# Patient Record
Sex: Female | Born: 1945 | Race: White | Hispanic: No | Smoking: Heavy tobacco smoker
Health system: Southern US, Community
[De-identification: ages and names within clinical notes are randomized; demographics above are authoritative.]

## PROBLEM LIST (undated history)

## (undated) DIAGNOSIS — F319 Bipolar disorder, unspecified: Secondary | ICD-10-CM

## (undated) DIAGNOSIS — F609 Personality disorder, unspecified: Secondary | ICD-10-CM

## (undated) DIAGNOSIS — Z9889 Other specified postprocedural states: Secondary | ICD-10-CM

## (undated) DIAGNOSIS — K219 Gastro-esophageal reflux disease without esophagitis: Secondary | ICD-10-CM

## (undated) DIAGNOSIS — F329 Major depressive disorder, single episode, unspecified: Secondary | ICD-10-CM

## (undated) DIAGNOSIS — M1990 Unspecified osteoarthritis, unspecified site: Secondary | ICD-10-CM

## (undated) DIAGNOSIS — J3489 Other specified disorders of nose and nasal sinuses: Secondary | ICD-10-CM

## (undated) DIAGNOSIS — K5909 Other constipation: Secondary | ICD-10-CM

## (undated) DIAGNOSIS — E538 Deficiency of other specified B group vitamins: Secondary | ICD-10-CM

## (undated) DIAGNOSIS — R569 Unspecified convulsions: Secondary | ICD-10-CM

## (undated) DIAGNOSIS — F1320 Sedative, hypnotic or anxiolytic dependence, uncomplicated: Secondary | ICD-10-CM

## (undated) DIAGNOSIS — M549 Dorsalgia, unspecified: Secondary | ICD-10-CM

## (undated) DIAGNOSIS — I10 Essential (primary) hypertension: Secondary | ICD-10-CM

## (undated) DIAGNOSIS — G8929 Other chronic pain: Secondary | ICD-10-CM

## (undated) DIAGNOSIS — F1011 Alcohol abuse, in remission: Secondary | ICD-10-CM

## (undated) DIAGNOSIS — J449 Chronic obstructive pulmonary disease, unspecified: Secondary | ICD-10-CM

## (undated) DIAGNOSIS — F32A Depression, unspecified: Secondary | ICD-10-CM

## (undated) DIAGNOSIS — F341 Dysthymic disorder: Secondary | ICD-10-CM

## (undated) DIAGNOSIS — Z720 Tobacco use: Secondary | ICD-10-CM

## (undated) DIAGNOSIS — F1010 Alcohol abuse, uncomplicated: Secondary | ICD-10-CM

## (undated) DIAGNOSIS — E785 Hyperlipidemia, unspecified: Secondary | ICD-10-CM

## (undated) HISTORY — DX: Other chronic pain: G89.29

## (undated) HISTORY — DX: Unspecified osteoarthritis, unspecified site: M19.90

## (undated) HISTORY — PX: APPENDECTOMY: SHX54

## (undated) HISTORY — DX: Alcohol abuse, in remission: F10.11

## (undated) HISTORY — DX: Chronic obstructive pulmonary disease, unspecified: J44.9

## (undated) HISTORY — DX: Other specified disorders of nose and nasal sinuses: J34.89

## (undated) HISTORY — DX: Other constipation: K59.09

## (undated) HISTORY — DX: Other specified postprocedural states: Z98.890

## (undated) HISTORY — DX: Dorsalgia, unspecified: M54.9

## (undated) HISTORY — PX: OTHER SURGICAL HISTORY: SHX169

---

## 2004-08-31 ENCOUNTER — Ambulatory Visit: Payer: Self-pay | Admitting: Cardiology

## 2004-08-31 ENCOUNTER — Inpatient Hospital Stay (HOSPITAL_COMMUNITY): Admission: EM | Admit: 2004-08-31 | Discharge: 2004-09-07 | Payer: Self-pay | Admitting: Emergency Medicine

## 2004-10-31 ENCOUNTER — Inpatient Hospital Stay (HOSPITAL_COMMUNITY): Admission: EM | Admit: 2004-10-31 | Discharge: 2004-11-04 | Payer: Self-pay | Admitting: Emergency Medicine

## 2004-10-31 ENCOUNTER — Ambulatory Visit: Payer: Self-pay | Admitting: Family Medicine

## 2004-11-07 ENCOUNTER — Ambulatory Visit (HOSPITAL_COMMUNITY): Admission: RE | Admit: 2004-11-07 | Discharge: 2004-11-07 | Payer: Self-pay | Admitting: Internal Medicine

## 2005-05-03 ENCOUNTER — Emergency Department (HOSPITAL_COMMUNITY): Admission: EM | Admit: 2005-05-03 | Discharge: 2005-05-03 | Payer: Self-pay | Admitting: Emergency Medicine

## 2005-07-13 ENCOUNTER — Ambulatory Visit: Payer: Self-pay | Admitting: Physical Medicine & Rehabilitation

## 2005-07-13 ENCOUNTER — Encounter
Admission: RE | Admit: 2005-07-13 | Discharge: 2005-10-11 | Payer: Self-pay | Admitting: Physical Medicine & Rehabilitation

## 2005-07-17 ENCOUNTER — Ambulatory Visit (HOSPITAL_COMMUNITY)
Admission: RE | Admit: 2005-07-17 | Discharge: 2005-07-17 | Payer: Self-pay | Admitting: Physical Medicine & Rehabilitation

## 2005-09-11 ENCOUNTER — Ambulatory Visit: Payer: Self-pay | Admitting: Physical Medicine & Rehabilitation

## 2005-10-10 ENCOUNTER — Encounter
Admission: RE | Admit: 2005-10-10 | Discharge: 2006-01-08 | Payer: Self-pay | Admitting: Physical Medicine & Rehabilitation

## 2005-11-06 ENCOUNTER — Ambulatory Visit: Payer: Self-pay | Admitting: Physical Medicine & Rehabilitation

## 2005-12-28 ENCOUNTER — Ambulatory Visit: Payer: Self-pay | Admitting: Gastroenterology

## 2005-12-28 ENCOUNTER — Encounter (INDEPENDENT_AMBULATORY_CARE_PROVIDER_SITE_OTHER): Payer: Self-pay | Admitting: Specialist

## 2005-12-28 ENCOUNTER — Ambulatory Visit: Payer: Self-pay | Admitting: Physical Medicine & Rehabilitation

## 2005-12-28 ENCOUNTER — Ambulatory Visit (HOSPITAL_COMMUNITY): Admission: RE | Admit: 2005-12-28 | Discharge: 2005-12-28 | Payer: Self-pay | Admitting: Gastroenterology

## 2005-12-28 DIAGNOSIS — Z9889 Other specified postprocedural states: Secondary | ICD-10-CM

## 2005-12-28 HISTORY — DX: Other specified postprocedural states: Z98.890

## 2006-01-03 ENCOUNTER — Ambulatory Visit (HOSPITAL_COMMUNITY)
Admission: RE | Admit: 2006-01-03 | Discharge: 2006-01-03 | Payer: Self-pay | Admitting: Physical Medicine & Rehabilitation

## 2006-01-23 ENCOUNTER — Encounter
Admission: RE | Admit: 2006-01-23 | Discharge: 2006-04-23 | Payer: Self-pay | Admitting: Physical Medicine & Rehabilitation

## 2006-02-21 ENCOUNTER — Ambulatory Visit: Payer: Self-pay | Admitting: Physical Medicine & Rehabilitation

## 2006-03-15 ENCOUNTER — Encounter
Admission: RE | Admit: 2006-03-15 | Discharge: 2006-06-13 | Payer: Self-pay | Admitting: Physical Medicine & Rehabilitation

## 2006-03-20 ENCOUNTER — Encounter (HOSPITAL_COMMUNITY)
Admission: RE | Admit: 2006-03-20 | Discharge: 2006-04-19 | Payer: Self-pay | Admitting: Physical Medicine & Rehabilitation

## 2006-03-26 ENCOUNTER — Ambulatory Visit: Payer: Self-pay | Admitting: Physical Medicine & Rehabilitation

## 2006-05-10 ENCOUNTER — Ambulatory Visit: Payer: Self-pay | Admitting: Physical Medicine & Rehabilitation

## 2006-06-13 ENCOUNTER — Encounter
Admission: RE | Admit: 2006-06-13 | Discharge: 2006-09-11 | Payer: Self-pay | Admitting: Physical Medicine & Rehabilitation

## 2006-06-13 ENCOUNTER — Ambulatory Visit: Payer: Self-pay | Admitting: Physical Medicine & Rehabilitation

## 2006-06-26 ENCOUNTER — Ambulatory Visit (HOSPITAL_COMMUNITY)
Admission: RE | Admit: 2006-06-26 | Discharge: 2006-06-26 | Payer: Self-pay | Admitting: Physical Medicine & Rehabilitation

## 2006-07-18 ENCOUNTER — Emergency Department (HOSPITAL_COMMUNITY): Admission: EM | Admit: 2006-07-18 | Discharge: 2006-07-18 | Payer: Self-pay | Admitting: Emergency Medicine

## 2006-07-19 ENCOUNTER — Ambulatory Visit: Payer: Self-pay | Admitting: Physical Medicine & Rehabilitation

## 2006-09-19 ENCOUNTER — Encounter
Admission: RE | Admit: 2006-09-19 | Discharge: 2006-12-18 | Payer: Self-pay | Admitting: Physical Medicine & Rehabilitation

## 2006-09-19 ENCOUNTER — Ambulatory Visit: Payer: Self-pay | Admitting: Physical Medicine & Rehabilitation

## 2006-09-24 ENCOUNTER — Ambulatory Visit (HOSPITAL_COMMUNITY)
Admission: RE | Admit: 2006-09-24 | Discharge: 2006-09-24 | Payer: Self-pay | Admitting: Physical Medicine & Rehabilitation

## 2006-10-04 ENCOUNTER — Encounter
Admission: RE | Admit: 2006-10-04 | Discharge: 2007-01-02 | Payer: Self-pay | Admitting: Physical Medicine & Rehabilitation

## 2006-10-29 ENCOUNTER — Ambulatory Visit: Payer: Self-pay | Admitting: Physical Medicine & Rehabilitation

## 2006-11-13 ENCOUNTER — Ambulatory Visit: Payer: Self-pay | Admitting: Physical Medicine & Rehabilitation

## 2006-12-17 ENCOUNTER — Ambulatory Visit: Payer: Self-pay | Admitting: Physical Medicine & Rehabilitation

## 2007-01-14 ENCOUNTER — Encounter
Admission: RE | Admit: 2007-01-14 | Discharge: 2007-02-26 | Payer: Self-pay | Admitting: Physical Medicine & Rehabilitation

## 2007-02-15 ENCOUNTER — Encounter
Admission: RE | Admit: 2007-02-15 | Discharge: 2007-05-16 | Payer: Self-pay | Admitting: Physical Medicine & Rehabilitation

## 2007-02-15 ENCOUNTER — Ambulatory Visit: Payer: Self-pay | Admitting: Physical Medicine & Rehabilitation

## 2007-03-25 ENCOUNTER — Ambulatory Visit: Payer: Self-pay | Admitting: Orthopedic Surgery

## 2007-03-25 DIAGNOSIS — M549 Dorsalgia, unspecified: Secondary | ICD-10-CM | POA: Insufficient documentation

## 2007-03-30 ENCOUNTER — Emergency Department (HOSPITAL_COMMUNITY): Admission: EM | Admit: 2007-03-30 | Discharge: 2007-03-30 | Payer: Self-pay | Admitting: Emergency Medicine

## 2007-04-16 ENCOUNTER — Ambulatory Visit: Payer: Self-pay | Admitting: Physical Medicine & Rehabilitation

## 2007-05-10 ENCOUNTER — Encounter
Admission: RE | Admit: 2007-05-10 | Discharge: 2007-06-12 | Payer: Self-pay | Admitting: Physical Medicine & Rehabilitation

## 2007-06-07 ENCOUNTER — Ambulatory Visit: Payer: Self-pay | Admitting: Physical Medicine & Rehabilitation

## 2007-06-18 ENCOUNTER — Emergency Department (HOSPITAL_COMMUNITY): Admission: EM | Admit: 2007-06-18 | Discharge: 2007-06-18 | Payer: Self-pay | Admitting: Emergency Medicine

## 2007-07-11 ENCOUNTER — Encounter
Admission: RE | Admit: 2007-07-11 | Discharge: 2007-10-09 | Payer: Self-pay | Admitting: Physical Medicine & Rehabilitation

## 2007-07-11 ENCOUNTER — Ambulatory Visit: Payer: Self-pay | Admitting: Physical Medicine & Rehabilitation

## 2007-07-31 ENCOUNTER — Ambulatory Visit: Payer: Self-pay | Admitting: Physical Medicine & Rehabilitation

## 2007-09-05 ENCOUNTER — Ambulatory Visit: Payer: Self-pay | Admitting: Physical Medicine & Rehabilitation

## 2007-10-02 ENCOUNTER — Ambulatory Visit: Payer: Self-pay | Admitting: Physical Medicine & Rehabilitation

## 2007-11-01 ENCOUNTER — Encounter
Admission: RE | Admit: 2007-11-01 | Discharge: 2008-01-30 | Payer: Self-pay | Admitting: Physical Medicine & Rehabilitation

## 2007-11-04 ENCOUNTER — Ambulatory Visit: Payer: Self-pay | Admitting: Physical Medicine & Rehabilitation

## 2007-12-04 ENCOUNTER — Ambulatory Visit: Payer: Self-pay | Admitting: Physical Medicine & Rehabilitation

## 2008-01-03 ENCOUNTER — Ambulatory Visit: Payer: Self-pay | Admitting: Physical Medicine & Rehabilitation

## 2008-01-30 ENCOUNTER — Encounter
Admission: RE | Admit: 2008-01-30 | Discharge: 2008-04-29 | Payer: Self-pay | Admitting: Physical Medicine & Rehabilitation

## 2008-01-31 ENCOUNTER — Ambulatory Visit: Payer: Self-pay | Admitting: Physical Medicine & Rehabilitation

## 2008-02-28 ENCOUNTER — Ambulatory Visit: Payer: Self-pay | Admitting: Physical Medicine & Rehabilitation

## 2008-03-27 ENCOUNTER — Ambulatory Visit: Payer: Self-pay | Admitting: Physical Medicine & Rehabilitation

## 2008-04-27 ENCOUNTER — Encounter
Admission: RE | Admit: 2008-04-27 | Discharge: 2008-07-26 | Payer: Self-pay | Admitting: Physical Medicine & Rehabilitation

## 2008-04-27 ENCOUNTER — Ambulatory Visit: Payer: Self-pay | Admitting: Physical Medicine & Rehabilitation

## 2008-05-25 ENCOUNTER — Ambulatory Visit: Payer: Self-pay | Admitting: Physical Medicine & Rehabilitation

## 2008-06-19 ENCOUNTER — Ambulatory Visit: Payer: Self-pay | Admitting: Physical Medicine & Rehabilitation

## 2008-07-17 ENCOUNTER — Ambulatory Visit: Payer: Self-pay | Admitting: Physical Medicine & Rehabilitation

## 2008-08-10 ENCOUNTER — Encounter
Admission: RE | Admit: 2008-08-10 | Discharge: 2008-11-08 | Payer: Self-pay | Admitting: Physical Medicine & Rehabilitation

## 2008-08-11 ENCOUNTER — Ambulatory Visit: Payer: Self-pay | Admitting: Physical Medicine & Rehabilitation

## 2008-09-11 ENCOUNTER — Ambulatory Visit: Payer: Self-pay | Admitting: Physical Medicine & Rehabilitation

## 2008-10-09 ENCOUNTER — Ambulatory Visit: Payer: Self-pay | Admitting: Physical Medicine & Rehabilitation

## 2008-11-03 ENCOUNTER — Ambulatory Visit: Payer: Self-pay | Admitting: Physical Medicine & Rehabilitation

## 2008-12-03 ENCOUNTER — Encounter
Admission: RE | Admit: 2008-12-03 | Discharge: 2009-03-03 | Payer: Self-pay | Admitting: Physical Medicine & Rehabilitation

## 2008-12-04 ENCOUNTER — Ambulatory Visit: Payer: Self-pay | Admitting: Physical Medicine & Rehabilitation

## 2009-01-01 ENCOUNTER — Ambulatory Visit: Payer: Self-pay | Admitting: Physical Medicine & Rehabilitation

## 2009-01-29 ENCOUNTER — Ambulatory Visit: Payer: Self-pay | Admitting: Physical Medicine & Rehabilitation

## 2009-03-28 ENCOUNTER — Emergency Department (HOSPITAL_COMMUNITY): Admission: EM | Admit: 2009-03-28 | Discharge: 2009-03-28 | Payer: Self-pay | Admitting: Emergency Medicine

## 2009-04-20 ENCOUNTER — Encounter: Payer: Self-pay | Admitting: Cardiology

## 2009-06-15 DIAGNOSIS — I10 Essential (primary) hypertension: Secondary | ICD-10-CM | POA: Insufficient documentation

## 2009-06-15 DIAGNOSIS — R55 Syncope and collapse: Secondary | ICD-10-CM

## 2009-06-15 DIAGNOSIS — E538 Deficiency of other specified B group vitamins: Secondary | ICD-10-CM | POA: Insufficient documentation

## 2009-06-15 DIAGNOSIS — J449 Chronic obstructive pulmonary disease, unspecified: Secondary | ICD-10-CM

## 2009-06-15 DIAGNOSIS — R5381 Other malaise: Secondary | ICD-10-CM | POA: Insufficient documentation

## 2009-06-15 DIAGNOSIS — F17200 Nicotine dependence, unspecified, uncomplicated: Secondary | ICD-10-CM | POA: Insufficient documentation

## 2009-06-15 DIAGNOSIS — R5383 Other fatigue: Secondary | ICD-10-CM

## 2009-06-15 HISTORY — DX: Deficiency of other specified B group vitamins: E53.8

## 2009-06-16 ENCOUNTER — Ambulatory Visit: Payer: Self-pay | Admitting: Cardiology

## 2009-06-16 ENCOUNTER — Encounter (INDEPENDENT_AMBULATORY_CARE_PROVIDER_SITE_OTHER): Payer: Self-pay | Admitting: *Deleted

## 2009-06-16 DIAGNOSIS — R9431 Abnormal electrocardiogram [ECG] [EKG]: Secondary | ICD-10-CM

## 2009-06-28 ENCOUNTER — Ambulatory Visit: Payer: Self-pay | Admitting: Cardiology

## 2009-06-28 ENCOUNTER — Encounter: Payer: Self-pay | Admitting: Cardiology

## 2009-11-22 ENCOUNTER — Telehealth (INDEPENDENT_AMBULATORY_CARE_PROVIDER_SITE_OTHER): Payer: Self-pay | Admitting: *Deleted

## 2010-06-19 ENCOUNTER — Encounter: Payer: Self-pay | Admitting: Physical Medicine & Rehabilitation

## 2010-06-28 NOTE — Progress Notes (Signed)
Summary: Pt question  Phone Note Call from Patient Call back at Home Phone [phone]   Summary of Call: Pt left 2 messages on voicemail asking for a return call. Both messages were confusing with pt stating something about wanting to know what Eugenie Birks is and that the doctor put her Lexiscan. Pt stated in second message that she keeps calling the heart doctor's office but getting the hospital. She stated in the message that she didn't call the hospital and doesn't know how she got them. I am not sure why pt felt she was leaving a message at the hospital when both messages were left in out office.  Contacted pt via phone. She states she doesn't know what Eugenie Birks is but she thinks she is suppose to be on it. Pt notified that Eugenie Birks is the medication that was used to stress her heart for the stress test. Pt is not suppose to be taking this medication. Pt notified her test was normal and she does not need further cardiac work up. Pt verbalized understanding.  Initial call taken by: Cyril Loosen, RN, BSN,  November 22, 2009 4:24 PM
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
## 2010-06-28 NOTE — Letter (Signed)
Summary: Internal Other/ PATIENT HISTORY FORM  Internal Other/ PATIENT HISTORY FORM   Imported By: Dorise Hiss 06/16/2009 16:19:26  _____________________________________________________________________  External Attachment:    Type:   Image     Comment:   External Document

## 2010-06-28 NOTE — Letter (Signed)
Summary: Lexiscan or Dobutamine Pharmacist, community at [facility]  [address]   [place], [place] [address]   Phone: [phone]  Fax: [phone]      [facility]  Lexiscan or Dobutamine Cardiolite Strss Test    [facility] [place]  Appointment Date:_  Appointment Time:_  Your doctor has ordered a CARDIOLITE STRESS TEST using a medication to stimulate exercise so that you will not have to walk on the treadmill to determine the condition of your heart during stress. If you take blood pressure medication, ask your doctor if you should take it the day of your test.  You may take your medications the day of your test.You should not have anything to eat or drink at least 4 hours before your test is scheduled, and no caffeine, including decaffeinated tea and coffee, chocolate, and soft drinks for 24 hours before your test.  You will need to register at the Outpatient/Main Entrance at the hospital 15 minutes before your appointment time. It is a good idea to bring a copy of your order with you. They will direct you to the Diagnostic Imaging (Radiology) Department.  You will be asked to undress from the waist up and given a hospital gown to wear, so dress comfortably from the waist down for example: Sweat pants, shorts, or skirt Rubber soled lace up shoes (tennis shoes)  Plan on about three hours from registration to release from the hospital

## 2010-06-28 NOTE — Letter (Signed)
Summary: [facility]  [facility]   Imported By: Zachary George 06/15/2009 16:14:28  _____________________________________________________________________  External Attachment:    Type:   Image     Comment:   External Document

## 2010-10-11 NOTE — Assessment & Plan Note (Signed)
FOLLOWUP OFFICE NOTE   Ashley Hall is back regarding chronic back pain.  She states that her back  has worsened since the initial medial branch block.  She did have good  relief the day of the procedure lasting for a few hours.  She is  reported to have 0/10 pain leaving the office on June 2.  Since the time  of the procedure, she has had back and left hip pain.  She has had some  persistent leg pain as well distally, although MRI is not concordant  with a radicular problem.  Sleep is poor.  She has pain with walking and  bending.  She rates her pain an 8/10 to 10/10.  Pain interferes with  general activity, relations with others, and enjoyment of life on a  severe level.   REVIEW OF SYSTEMS:  The patient reports trouble walking, dizziness,  confusion, depression, anxiety, constipation, coughing, abdominal pain,  wheezing, or shortness of breath.  Full review is in the written health  and history section.   SOCIAL HISTORY:  Without significant change today.   PHYSICAL EXAM:  Blood pressure is 108/63, pulse is 99, respiratory rate  is 20.  She is satting 98% on room air.  The patient is pleasant, alert and oriented x3.  She has been anxious at  times.  She ambulates for me and was antalgic to the left side today.  Left  greater trochanter was painful with palpation.  She was also tender on  lower lumbar paraspinals and facets.  Facet maneuvers were positive.  She is limited in flexion as well today.  She had Lidoderm patches over  the low back.  I saw no focal motor or sensory signs in the lower  extremities today.  Reflexes are generally 1+ to 2+ bilaterally.  Neck  and upper extremity exam was stable.  HEART:  Regular.  CHEST:  Clear.  ABDOMEN:  Soft and nontender.   ASSESSMENT:  1. Chronic low back pain, generally lumbar facet in nature.  The      patient had a positive response with the medial branch blockers.      Response lasted a few hours.  2. Chronic daily headaches.  3.  Cervical facet disease and post-laminectomy syndrome.  4. Bipolar disorder.  5. Myofascial pain.  6. History of left trochanteric bursitis.  7. Osteoarthritis of the knees and potentially the hips.   PLAN:  1. Continue fentanyl and Vicodin as ordered.  2. After informed consent we injected the left greater trochanter with      40 mg of Kenalog and 3 cc of 1% lidocaine.  3. Gave the patient a prednisone burst to decrease inflammation and      pain here acutely.  4. We will send her back to Dr. Wynn Banker for radiofrequency ablation      at L4-5 and L5-S1 bilaterally.  5. I will see the patient back pending followup with Dr. Wynn Banker.     Ranelle Oyster, M.D.  Electronically Signed    ZTS/MedQ  D:  11/13/2006 12:50:40  T:  11/13/2006 15:54:31  Job #:  [id number]

## 2010-10-11 NOTE — Procedures (Signed)
NAME:  Ashley Hall, Ashley Hall              ACCOUNT NO.:  [id number]   MEDICAL RECORD NO.:  [id number]          PATIENT TYPE:  REC   LOCATION:  [facility]                          FACILITY:  [facility]   PHYSICIAN:  Erick Colace, M.D.DATE OF BIRTH:  01/11/1946   DATE OF PROCEDURE:  12/17/2006  DATE OF DISCHARGE:                               OPERATIVE REPORT   This is a left L5 dorsal ramus injection and radiofrequency neurotomy,  left L4 medial branch block and radiofrequency neurotomy, left L3 medial  branch block and radiofrequency neurotomy.   INDICATION:  Chronic low back pain, lumbar facet, relieved by medial  branch blocks x2.   Pain is only partially responsive to narcotic analgesic management.   Informed consent was obtained after the describing risks and benefits of  the procedure to the patient today.  These include bleeding, bruising,  infection, loss of bowel or bladder function, temporary or permanent  paralysis.  She elects proceed and has given written consent.   The patient placed prone on fluoroscopy table.  Betadine prep, sterile  drape.  A 25-gauge inch and a half needle was used to anesthetize the  skin and subcu tissue, 1% lidocaine x2 mL, then a 20 gauge 10-cm RF  needle with 10-mm curved active tip was inserted targeting the left S1  SAP-sacral ala junction, bone contact made, confirmed with lateral  imaging.  Motor Stim x3 volts demonstrated no lower extremity twitch,  followed by injection of 1 mL of a solution containing 1 mL of 4 mg/mL  dexamethasone and 2 mL of 1% MPF lidocaine, followed by RF lesioning at  70 degrees x70 seconds.  Then the left L5 SAP-transverse process  junction targeted, bone contact made, confirmed with lateral imaging.  Motor Stim x3 volts demonstrated no lower extremity twitch, followed by  RF lesioning at 70 degrees x 70 seconds, after the dexamethasone-  lidocaine solution was injected, and last the left L4 SAP-transverse  process junction  targeted, bone contact made, confirmed with lateral  imaging.  Motor Stim x3 volts demonstrated no lower extremity twitch  followed by injection 1 mL of the dexamethasone-lidocaine solution and  RF lesioning at 70 degrees x70 seconds.  The patient tolerated the  procedure well.  Post injection instructions given.  Given the patient  moved quite a bit, particularly on the last RF, will Valium 5 mg p.o.  one to two prior to the procedure in 1 month on the right side when we  do the right side in 1 month.      Erick Colace, M.D.  Electronically Signed     AEK/MEDQ  D:  12/17/2006 10:36:20  T:  12/17/2006 11:31:07  Job:  [id number]

## 2010-10-11 NOTE — Assessment & Plan Note (Signed)
FOLLOWUP OFFICE NOTE   HISTORY OF PRESENT ILLNESS:  The patient is back regarding her chronic  pain.  She rates the pain as 6/10.  The glucosamine has helped her knees  somewhat.  We had an MRI performed of her back as this seemed to  progressing from a symptomatic standpoint.  The MRI revealed significant  facet arthropathy but otherwise no substantial change from her last one  in August 2007.   She is set up to see Dr. Wynn Banker at the beginning of next month for  injection.  The patient is using her Fentanyl patch and Vicodin.  She  complains of headaches.  She had told me previously that Lidoderm and  Topamax seemed to be helping her back and headaches.  The patient also  is on Celebrex and Cymbalta.   REVIEW OF SYSTEMS:  The patient reports trouble walking, occasional  depression, tingling.  Other pertinent positives listed above.  Full  review is in the written office history section.   SOCIAL HISTORY:  Without significant change today.   PHYSICAL EXAMINATION:  Blood pressure is 96/56, pulse is 85, respiratory  rate 16.  She is sating 99% on room air.  The patient is pleasant, in no  acute distress.  She is alert and oriented x3.  Affect is generally  bright and appropriate.  Heart is regular.  Chest is clear.  Abdomen is  soft, nontender.  On examination of the back, she had some ongoing  tenderness in the lumbar paraspinals -- left greater than right.  She  had pain over the facets and worse pain with facet maneuvers today.  Flexion caused some discomfort as well.  Seated slump test and straight  leg testing were equivocal to positive.  Strength remains reserved at 5-  /5.  Reflexes 2+.  Sensory exam is grossly intact.  The patient had some  pain with range of motion of the neck and simple flexion and extension  and bending to the sides.  Bilateral knees reveal mild crepitus and some  pain with meniscal maneuvers.  Mild anterior instability was noted in  both knees as  well.   ASSESSMENT:  1. Chronic daily headaches related to cervical facet disease and post      laminectomy syndrome.  2. Bipolar disorder.  3. Lumbar spondylosis with facet arthropathy.  4. Myofascial pain.  5. History of left greater trochanter bursitis.  6. History of osteoarthritis of the knees and hips.   PLAN:  1. After informed consent, we injected both knees using 40 mg of      Kenalog and 3 cc of 1% lidocaine.  The patient tolerated it well.  2. Maintain current Fentanyl and Vicodin doses.  3. Topamax and Lidoderm were continued as well as Celebrex and      Cymbalta today.  4. The patient will see Dr. Wynn Banker in June for medial branch blocks      bilaterally at L4-5 and L5-S1.  5. I will see her back after injections.      Ranelle Oyster, M.D.  Electronically Signed    ZTS/MedQ  D:  10/23/2006 12:41:05  T:  10/23/2006 13:05:22  Job #:  [id number]

## 2010-10-11 NOTE — Assessment & Plan Note (Signed)
Ashley Hall is back regarding her chronic pain.  She has been doing better  in general with her headaches and neck pain with recurrent treatment  plan.  Over the last few months, her low back pain seems to be more of a  factor, and she is very limited with her walking.  She has pain in her  back which develops when she walks to the mailbox or to the car.  It  improves somewhat when she sits.  It also bothers her when she stands  for periods of time.  She rates the pain a 5 to 8/10.  I do not have any  up to date imaging of her back to view.  Pain sometimes in the back  radiates into her leg, but the back pain itself seems to be her primary  complaint.  The patient describes the pain as sharp, stabbing and  aching.  The pain interferes with general activity, relations with  others, enjoyment of life on a moderate level.   REVIEW OF SYSTEMS:  The patient reports occasional bladder control  problems, confusion and depression, which are baseline.  A full review  is in the written health and history section.   SOCIAL HISTORY:  The patient is without change.  She has her personal  care assistant with her today.   PHYSICAL EXAMINATION:  VITAL SIGNS:  Blood pressure is 105/44, pulse is  87, respiratory rate 16.  She is satting 96% on room air.  GENERAL:  The patient is pleasant, alert and oriented x3.  Affect is  generally bright, a bit anxious at times and tearful when she discussed  her pain.  HEART:  Regular.  CHEST:  Clear.  ABDOMEN:  Soft, nontender.  Patient had tenderness in the left lumbar  paraspinals and left flank.  She had some pain with palpation over the  facets and spinous processes in the left side.  She had Lidoderm patches  over her back.  She had pain with left side bending and left facet  maneuvers.  Extension caused some discomfort.  She had minimal to no  pain with right side bending and right-sided facet maneuvers today.  Flexion caused minimal discomfort.  Slump test was  equivocal to  positive.  Strength was sporadic but generally 4+-5/5.  Reflexes are 2+.  Sensory exam was grossly intact.  NECK:  Stable.  Cognitively, she was generally appropriate at her  baseline.   ASSESSMENT:  1. Chronic daily headaches related to cervical facet disease and post-      laminectomy syndrome.  2. Bipolar disorder.  3. Myofascial pain.  4. Lumbar spondylosis with facet arthropathy.  5. Left per-trochanteric bursitis, questionable osteoarthritis of the      hips and knees.   PLAN:  1. Continue Fentanyl and Vicodin as ordered.  2. Maintain Lidoderm and Topamax at current doses, as well as Celebrex      and Cymbalta.  3. Gave patient a trial of Flector patches for her knees.  She may      also try over the counter Glucosamine.  4. We will send the patient for MRI of lumbar spine to determine      further coarse of treatment. Will see her back pending study.      Ranelle Oyster, M.D.  Electronically Signed     ZTS/MedQ  D:  09/21/2006 13:27:07  T:  09/21/2006 13:47:01  Job #:  [id number]

## 2010-10-11 NOTE — Assessment & Plan Note (Signed)
Ashley Hall is back regarding her low back pain.  She states that she is  having more problems than ever.  Her Oswestry score is 62%.  She rates  her pain at 5/10.  She feels that she can walk on her own now.  Pain is  sharp, burning, and aching.  Pain interferes with general activity,  relations with others and enjoyment of life on a severe level.  She  states that I am not giving her enough pain medication.   The patient states pain is worse with walking and standing, sometimes  prolonged sitting.  Pain improves with her medications as well as heat.   REVIEW OF SYSTEMS:  Notable for the above as well as depression,  anxiety, weight gain, limb swelling, has a wheezing and coughing.  Full  review is in the written health and history section of the chart.   SOCIAL HISTORY:  Notable for her sister being ill with cancer,  apparently.  She also reports that her Psychiatrist is no longer in  business.   PHYSICAL EXAMINATION:  VITAL SIGNS:  Blood pressure is 120/70 and pulse  is 84.  GENERAL:  The patient is generally pleasant, alert, and oriented x3.  MUSCULOSKELETAL:  She has variable motor function exam with anywhere  from 3-5/5 strength that noted in all the muscles, changing from moment  to moment, frankly.  Sensory exam was grossly intact.  HEART:  Regular.  CHEST:  Clear.  ABDOMEN:  Soft and nontender.  NEUROLOGIC:  Cognitively, she is unchanged.  She continues to fixate on  medications.   ASSESSMENT:  1. Lumbar spondylosis with facet syndrome.  2. Trochanteric bursitis of the hips.  3. Post laminectomy syndrome of the cervical spine.  4. Bipolar disorder.  5. Myofascial pain.   PLAN:  1. We will try Tylenol, #3 for breakthrough pain 1 q.8 h. p.r.n.  She      can stay with her fentanyl patch 100 mcg q.72 h.  2. Maintain Celexa, Wellbutrin, Lidoderm, Topamax, and Celebrex at      current doses.  3. She needs psychiatric followup.  I will not fill Xanax or Ativan      for her.  4. We will see her back in 3 months for followup and 1 month in      nursing clinic.      Ranelle Oyster, M.D.  Electronically Signed     ZTS/MedQ  D:  07/17/2008 12:53:23  T:  07/18/2008 02:40:43  Job #:  [id number]

## 2010-10-11 NOTE — Assessment & Plan Note (Signed)
Ashley Hall is back regarding her multiple pain complaints.  She is seeing a  new psychiatrist who has her on Remeron and Valium and she seems to be  doing fairly well with that.  She states her pain is 6/10 today and  states it is stabbing, also bothering her head, back, and legs.  She is  walking and getting around more.  She uses a rollator for balance and  support.  She remains on the fentanyl patch 100 mcg, Tylenol No. 3 one  q.8 h. p.r.n. breakthrough pain.   REVIEW OF SYSTEMS:  Notable for trouble walking, bowel, bladder issues  as well as depression, anxiety, constipation, skin rash.  Other  pertinent positives are above and full review is in the written health  and history section of the chart.   SOCIAL HISTORY:  The patient lives alone, divorced, still smoked 1 pack  of cigarettes per day.   PHYSICAL EXAMINATION:  Blood pressure is 116/72, pulse is 86,  respiratory rate 18.  She is sating 99% on room air.  The patient is  pleasant, alert, and oriented x3.  She walked with her walker and is  able to walk in the room without it today.  She seemed to be much more  balanced, much more alert and grounded today.  Posture was fair to good.  Strength is fluctuating in 4- to 4/5 throughout.  Sensory exam is  essentially intact.  Heart is regular.  Chest is clear.  Abdomen is  soft, nontender.  Cognitively, I thought she is a bit more alert and  focused, however, she continues to fix in her usual topics.   ASSESSMENT:  1. Lumbar spondylosis with facet syndrome.  2. Bilateral trochanteric bursitis.  3. Cervical post laminectomy syndrome.  4. Bipolar disorder.  5. Myofascial pain.   PLAN:  1. We will maintain fentanyl patch at 100 mcg q.72 h.  I did not want      to go higher on this at this point and explained this to the      patient today.  2. We refilled Tylenol No. 3 1-2 q.8 h. p.r.n. #100.  She may use this      for breakthrough pain and occasionally may use 2 at a time when    needed.  3. Encouraged stretching exercise, appropriate diet, sleep, etc.  She      is to increase her physical stamina as well.  4. Continue Valium and Remeron per psychiatrist.  5. No other refills were indicated on meds including Topamax,      Celebrex, Lidoderm.  6. See her back in 1 month with nursing, 3 months with me.      Ashley Hall, M.D.  Electronically Signed     ZTS/MedQ  D:  11/03/2008 12:44:17  T:  11/04/2008 02:36:18  Job #:  [id number]

## 2010-10-11 NOTE — Assessment & Plan Note (Signed)
Ashley Hall is back regarding her low back pain.  Pain has been generally  stable at 5-7/10.  She continues on her fentanyl patch with p.r.n. Norco  7.5.  She states that the left hip has been bothering her more affecting  her walking.  She has a hard time walking to the mailbox and similar  distances.  Pain is described as constant and aching.  Pain interferes  with general activity, in relationship with others, and enjoyment of  life on a moderate level.  He is on a Lidoderm patch as well as Topamax  and Celebrex still as well, which gives some relief.   REVIEW OF SYSTEMS:  Notable for trouble walking, anxiety, depression,  confusion, constipation at times.  Other pertinent positives are above  and full review is in the written health and history section.   SOCIAL HISTORY:  The patient is divorced, living alone, although her  sister is highly involved with the medications and oversight.   PHYSICAL EXAMINATION:  VITAL SINGS:  Blood pressure is 106/60, pulse is  107, respiratory rate is 18.  She is sating 96% on room air.  GENERAL:  The patient is pleasant and generally appropriate.  She  continues to fixate on medication regimen and how much Vicodin I will  give her.  She does show some insight when we discussed the reasoning  and rationale for her treatment course.  EXTREMITIES:  Strength is 5/5.  She has some pain with flexion more than  extension, although movement is fair.  She had some pain over left  greater trochanter with palpation and definitely was antalgic on the  left side with weightbearing.  HEART:  Regular.  CHEST:  Clear.  ABDOMEN:  Soft and nontender.   ASSESSMENT:  1. Lumbar spondylosis with facet syndrome.  2. Trochanteric bursitis of the hips.  3. Cervical spondylosis and post-laminectomy syndrome.  4. Bipolar disorder.  5. Myofascial pain.   PLAN:  1. Continue Norco 7.5/325 one q.8 h., #90.  2. Increase fentanyl patch to 100 mcg q.72 h., #10.  3. Continue  Celexa 40 mg daily.  4. Continue Wellbutrin.  5. Continue Lidoderm, Topamax, and Celebrex.  6. After informed consent, we injected the left greater trochanter      with 40 mg of Kenalog and 3 L of 1% lidocaine.  The patient      tolerated it well.  The area was prepped and cleaned after      injection with no ill effects.      Ashley Hall, M.D.  Electronically Signed     ZTS/MedQ  D:  04/27/2008 12:54:51  T:  04/28/2008 02:37:36  Job #:  [id number]
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
## 2010-10-11 NOTE — Assessment & Plan Note (Signed)
Ashley Hall is back with her back and neck pain.  She states that her back  is bothering her, and her legs are giving out.  She had a good response  initially with medial branch blocks; however, the RFs did not help as  much, as she is having persistent pain.  She is on a Fentanyl patch 50  mcg every 72 hours and on Vicodin 5/500 1 every 8 hours p.r.n.  She  rates her pain a 5-7/10, describes it as constant and aching.  Pain  interferes with general activity, relations with others, and enjoyment  of life on a moderate to severe level.  She uses Lidoderm patches as  well for back pain control.   REVIEW OF SYSTEMS:  Otherwise negative.  Full review is in written  health and history section.   SOCIAL HISTORY:  Without change.   PHYSICAL EXAMINATION:  Blood pressure is 108/63.  Pulse is 85.  Respiratory rate 18.  She is sating 100% on room air.  The patient is  pleasant, alert and oriented x3.  Affect is bright and appropriate.  She  has some swelling along the left wrist which apparently she strained in  a fall.  Left knee is a bit wobbly with gait.  She has some bruises on  the limbs.  Back is tender with extension more than flexion today.  Facet maneuvers are positive.  Strength is generally 4-5/5 in both lower  extremities with 1+ to 2+ reflexes.  Cognition was generally stable.  She was a bit anxious but near her baseline.   ASSESSMENT:  1. Chronic headaches, likely related to cervical facet syndrome and      postlaminectomy disorder.  Questionable migraine component.  2. Bipolar disorder.  3. Myofascial pain.  4. Lumbar spondylosis and facet arthropathy.  5. Peritrochanteric bursitis of hips.  6. Osteoarthritis of the knees.   PLAN:  1. Increase Fentanyl patch to 75 mcg every 72 hours.  2. Change Vicodin to oxycodone 5 mg 1 every 6 hours p.r.n. #100.  3. Continue Lidoderm and Topamax as well as Celebrex and Cymbalta.  4. Encourage ongoing activity and exercises for the back and  legs.  5. I wrote her a prescription for a left wrist splint to use during      the day for wrist support.  I think she might ultimately benefit      from a walking cane as well.  6. I will see her back in about 2 months' time, the nurse clinic in 1      month.      Ranelle Oyster, M.D.  Electronically Signed     ZTS/MedQ  D:  02/19/2007 11:50:18  T:  02/19/2007 14:04:23  Job #:  [id number]

## 2010-10-11 NOTE — Procedures (Signed)
NAME:  Ashley Hall, Ashley Hall              ACCOUNT NO.:  [id number]   MEDICAL RECORD NO.:  [id number]          PATIENT TYPE:  REC   LOCATION:  [facility]                          FACILITY:  [facility]   PHYSICIAN:  Erick Colace, M.D.DATE OF BIRTH:  07-17-1945   DATE OF PROCEDURE:  10/29/2006  DATE OF DISCHARGE:                               OPERATIVE REPORT   PROCEDURE:  Bilateral L5 dorsal ramus injection, bilateral L3 and L4  medial branch blocks under fluoroscopic guidance.   INDICATIONS:  Lumbar axial pain.  She has had one set of medial branch  blocks done February 22, 2006 which gave complete relief of pain from 6-  0, however, a second set of injections March 19, 2006 had no pain  relief.  She did have an interval repeat MRI September 24, 2006 showing  facet arthropathy, L4-5, L5-S1, and no evidence of spinal stenosis and  no change in L4-5 disk bulge.   Her pain is only partially responsive to medication management including  narcotic analgesics.   Informed consent was obtained after describing the risks and benefits of  the procedure to the patient.  These include bleeding, bruising,  infection, loss of bowel and bladder function, temporary or permanent  paralysis.  She elects to proceed and has given written consent. The  patient placed prone on fluoroscopy table.  Betadine prep, sterile  drape.  A 25 gauge, inch and a half needle was used to incise skin and  subcu tissue.  Then a  22 gauge, 3-1/2 inch spinal needle was inserted  at the S1 SAP sacral ala junction.  Bone contact made confirmed with  lateral imaging.  Omnipaque 180 x 0.5 mL demonstrated no intravascular  uptake then 0.5 mL of solution containing 1 mL of 40 mg per mL Depo-  Medrol and 2 mL of 2% MPF lidocaine were injected.  Then the right L5  SAP transverse process junction targeted.  Bone contact made confirmed  with lateral imaging.  Omnipaque 180 x 0.5 mL demonstrated no  intravascular uptake and 0.5 mL of  Depo-Medrol lidocaine solution  injected.  Then the right L4 SAP transverse process junction targeted.  Bone contact made confirmed with lateral imaging.  Omnipaque 180 x 0.5  mL demonstrated no intravascular uptake and 0.5 mL of Depo-Medrol  lidocaine solution injected.  Then the left L4 SAP transverse process  junction targeted.  Bone contact made confirmed with lateral imaging.  Omnipaque 180 x 0.5 mL demonstrated no intravascular uptake, then 0.5 mL  of Depo-Medrol lidocaine solution injected.  Then the left L5 SAP  transverse process junction targeted.  Bone contact made confirmed with  lateral imaging.  Omnipaque 180 x 0.5 mL demonstrated no intravascular  uptake then 0.5 mL of Depo-Medrol lidocaine solution injected.  Then the  left S1 SAP transverse process junction targeted.  Bone contact made  confirmed with lateral imaging.  Omnipaque 180 x 0.5 mL demonstrated no  intravascular uptake then 0.5 mL of Depo-Medrol lidocaine solution was  injected.  Pre-injection pain level 5/10.  Post-injection pain level  0/10.  Return to see Dr. Riley Kill next  month.  She may benefit from lumbar  RF.  She will see Dr. Riley Kill who will further evaluate.      Erick Colace, M.D.  Electronically Signed    AEK/MEDQ  D:  10/29/2006 10:46:42  T:  10/29/2006 12:26:51  Job:  [id number]

## 2010-10-11 NOTE — Assessment & Plan Note (Signed)
Ashley Hall is back regarding her neck and low back pain.  She has done well  with the change to Tylenol No. 3 for breakthrough pain.  She is walking  more.  She amazingly states that her pain is better, yet her pain  inventory score is 6-7/10 and it goes to 5/10.  Oswestry score, however,  is decreased to 50%.  She is walking more.  She uses a walker for  balance.  She is excited about getting outside with the warmer weather.  She still like me to prescribe medications for anxiety.  Apparently, she  is taking Ativan, but her family physician has left the area.   REVIEW OF SYSTEMS:  Notable for depression, anxiety, trouble walking,  occasional coughing.  Full review is in the written health and history  section.  Other pertinent positives are above.   SOCIAL HISTORY:  The patient lives alone, but sisters watch closely and  help with medications.   PHYSICAL EXAMINATION:  VITAL SIGNS:  Blood pressure 108/54, pulse is  101, respiratory 18.  She is sating 95% on room air.  GENERAL:  The patient is pleasant, alert, and oriented x3.  She walked  with her walker and without it today.  She is a bit wide based and  unstable without the walker, but much better posture wise than before.  MUSCULOSKELETAL:  Sensory exam is grossly unchanged.  Strength exam is  3+-4+/5 throughout with variability.  HEART:  Regular.  CHEST:  Clear.  ABDOMEN:  Soft and nontender.  NEUROLOGIC:  Cognitively she is at baseline and usually will fixate on  medication doses and complaints of some sort of another regarding why we  are not using a medication.   ASSESSMENT:  1. Lumbar spondylosis with facet syndrome.  2. Trochanteric bursitis of the hips.  3. Cervical postlaminectomy syndrome.  4. Bipolar disorder.  5. Myofascial pain.   PLAN:  1. Continue fentanyl patch 100 mcg q.72 h. with Tylenol No. 3 one q.8      h. p.r.n., #10 and #30 respectively were written.  2. We will continue Celexa, Wellbutrin, Lidoderm,  Topamax, and      Celebrex at current doses.  3. We will not fill Xanax or Ativan for this patient.  She needs a      family doctor or Psychiatry see her for this followup.  4. I will see her back in 1 month in nursing clinic and in 3 months in      my clinic.      Ranelle Oyster, M.D.  Electronically Signed     ZTS/MedQ  D:  08/11/2008 12:00:16  T:  08/12/2008 02:15:15  Job #:  [id number]

## 2010-10-11 NOTE — Assessment & Plan Note (Signed)
Ashley Hall returns today.  I last saw her December 17, 2006.  She underwent  left L5 dorsal ramus injection, radiofrequency neurotomy, left L4 medial  branch block and radiofrequency neurotomy, left L3 medial branch block  and radiofrequency neurotomy.  She had no complications with the  procedure.  She returns today scheduled for the right side, and upon  further questioning, both by myself and the nursing staff, she states  that she does not feel her left side is any better than it was prior to  the procedure, and that she does not notice that her pain has shifted  toward the right side.   Her main complaint at this time is that her hydrocodone was switched  from 5/500 to 5/325, and she does not think that this helps her quite as  much.  She continues on fentanyl patch 50 mcg q.72 h., Lidoderm patch on  12 off 12, 2 at a time.   INTERVAL MEDICAL HISTORY:  Otherwise negative.   PHYSICAL EXAMINATION:  GENERAL:  No acute distress.  Mood and affect is  irritable.  No evidence of lability or agitation.   Review of prior medial branch block on October 29, 2006, from 5 out of 10  pain to zero out of 10 pain.  Previous lumbar facet injection, February 22, 2006, she underwent left-sided medial branch blocks, and this  relieved 6 out of 10 pain to zero out of 10 pain.   IMPRESSION:  Lumbar pain with good response from medial branch blocks;  however, the left L3-L4-L5 RF did not produce similar relief.  I  discussed with the patient two possibilities.  One is that she still is  experiencing some soreness, given that the RF was done less than 1 month  ago, and that this may have resolve spontaneously over the couse of the  next couple of weeks.  The other possibility is that she did not get a  good effect from the lumbar RF, in which case I would be hesitant in  doing the right side.   I have refilled her hydrocodone to 5/500 #70, as well as the fentanyl 50  mcg until she gets in with Dr. Riley Kill  in a month.      Erick Colace, M.D.  Electronically Signed     AEK/MedQ  D:  01/14/2007 17:00:35  T:  01/15/2007 10:43:20  Job #:  [id number]

## 2010-10-11 NOTE — Assessment & Plan Note (Signed)
Ashley Hall is back regarding her back pain.  She continues to have pain  throughout the day.  She states that her pain medications are not  helping very much.  Her sister continues to give her medication daily  and highly monitors her medications.  She rates her pain as 7/10 on  average and described it as sharp, stabbing, and constant.  Pain  interferes with general activity, relations with others, and enjoyment  of life on a moderate-to-severe level.  The pain is most dramatic in the  mid-to-low back areas.   REVIEW OF SYSTEMS:  Notable for tingling, occasional constipation,  bladder frequency, trouble walking, depression, anxiety, and weight  gain.   SOCIAL HISTORY:  Unchanged.  Her sister watches her closely.  The  patient is frustrated by her lack of ability to move around.  Smoking  remains regular with a pack consumed a day.   PHYSICAL EXAMINATION:  Blood pressure is 142/55, pulse 78, respiratory  rate 22, and she is saturating 98% on room air.  The patient is generally pleasant and little more alert than she has  been on previous visits.  She has fair insight and awareness.  She was a  bit anxious at times, but overall appropriate.  BACK:  Remains tender in the low lumbar spine segments to deep  palpation.  She is able to bend to approximately 30 degrees before pain  sets in.  She has pain with extension as well in the lower lumbar spine.  Rotation caused some discomfort as well as lateral bending to a lesser  extent.  Strength is 5/5 in both legs.  Sensory exam is grossly intact.  HEART:  Regular.  CHEST:  Clear.  ABDOMEN:  Soft and nontender.  Weight is not dramatically changed.   ASSESSMENT:  1. 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  2. Lumbar spondylosis with facet syndrome.  3. Bipolar disorder.  4. Myofascial pain.  5. Trochanteric bursitis of the hips.  6. Osteoarthritis of the knee.   PLAN:  1. We will increase  breakthrough oxycodone to 10 mg q.6 hours p.r.n.  2. Refill fentanyl patch 75 mcg q.72 hours.  3. Lidoderm, Topamax, and Celebrex, as well as Cymbalta at current      dosing.  4. I will see her back in 3 months with Nurse Clinic follow up in 1      month's time.      Ranelle Oyster, M.D.  Electronically Signed     ZTS/MedQ  D:  11/04/2007 13:05:26  T:  11/05/2007 01:14:01  Job #:  [id number]

## 2010-10-11 NOTE — Assessment & Plan Note (Signed)
Ashley Hall is back regarding her low back, cervical pain.  She has had  similar levels of pain ranging from 5-7/10.  She thinks Vicodin is more  helpful than the oxycodone.  She is having some neck and mid back  symptoms.  She usually takes 2-3 oxycodone breakthrough a day.  She is  on the fentanyl patch 75 mcg q.72 h.  She remains on Lidoderm, Topamax,  Celebrex, and Cymbalta.  The patient also is on Wellbutrin for her mood.  She does complain of being angry and tearful a lot.  Her sister concurs.  Sleep is fair.   REVIEW OF SYSTEMS:  Notable for bladder control problems, confusion,  depression, anxiety, night sweats, and constipation.  Full review is  written out in the history section of the chart.   SOCIAL HISTORY:  The patient is married, lives alone.  Sisters help  coordinating to oversee her medication administration.   PHYSICAL EXAMINATION:  VITAL SIGNS:  Blood pressure is 123/57, pulse 74,  respiratory rate 18, and she is sating 100% on room air.  GENERAL:  The patient is generally pleasant, a little anxious.  She  continues to fixate on her medication regimen, particularly the  narcotics.  Lower back and cervical spine remain tender to the palpation  and had some pain with range of motion today as well, usually in all  planes but more flexion than extension.  Strength is 5/5 in both legs.  Sensory exam grossly intact.  Cognitively, she continues to have poor  memory, insight, and awareness.  HEART:  Regular rate.  CHEST:  Clear.  ABDOMEN:  Soft and nontender.   ASSESSMENT:  1. Underlying chronic cervicalgia related to facet syndrome and post      laminectomy syndrome.  2. Lumbar spondylosis with facet syndrome.  3. Bipolar disorder.  4. Myofascial pain.  5. Trochanteric bursitis of the hips.  6. Osteoarthritis.   PLAN:  1. Change the oxycodone over to hydrocodone 7.5/325 one q.8 h. p.r.n.      #90.  2. Fentanyl patch 75 mcg q.72 h. #10.  3. We will add Celexa 40 mg  nightly and stop Cymbalta.  4. She will continue with Wellbutrin at current dosing.  5. Maintain Lidoderm, Topamax, and Celebrex as they currently are.  6. Sisters to continue to overlook medications.      Ranelle Oyster, M.D.  Electronically Signed     ZTS/MedQ  D:  01/31/2008 16:37:05  T:  02/01/2008 06:54:43  Job #:  [id number]

## 2010-10-11 NOTE — Assessment & Plan Note (Signed)
Ashley Hall is back regarding her low back pain and hip pain.  She had  resulted left greater trochanter injection in November.  She still has  some pain in the back and down the anterior and posterior legs.  She is  walking a bit more at home.  The pain ranges from 6-9/10.  Pain  interferes with general activity, relations with others, enjoyment of  life on a moderate level.   REVIEW OF SYSTEMS:  Notable for trouble walking, spasms, confusion,  anxiety, loss of taste, fever, night sweats, constipation, coughing.  Full review is in the written health and history section and other  pertinent positives are above.   SOCIAL HISTORY:  The patient is single, living alone.  Sister is  involved heavily with her care here.  She is still smoking.   PHYSICAL EXAMINATION:  VITAL SIGNS:  Blood pressure is 122/69, pulse 92,  respiratory rate 18, she is sating 98% on room air.  GENERAL: The patient is pleasant, alert, and oriented x3.  It last  fixated on pain medications today.  Musculoskeletal:  She had some pain in both hips with palpation, but  freer range of motion today with straight leg raising and ambulation.  She still walks with slightly wide-based gait and tends to have some  problems with changes in direction.  Strength is more or less 5/5 with  some intubation intermittently.  HEART:  Regular.  CHEST:  Clear.  ABDOMEN:  Soft and nontender.   ASSESSMENT:  1. Lumbar spondylosis with facet syndrome.  2. Trochanteric bursitis of hips.  3. Cervical spondylosis post laminectomy syndrome.  4. Bipolar disorder.  5. Myofascial pain.   PLAN:  1. Continue Norco 7.5/325, #90.  2. Fentanyl patch 100 mcg q.72 h., #10.  3. Maintain Celexa, Wellbutrin, Lidoderm, Topamax, and Celebrex at      current doses.  4. We will send advanced home health, PT out to the house for gait and      balance evaluation and assessment, for adapt to the equipment.  She      needs to get on a regular home exercise  program.  5. I will see her back in 3 months with 1 month nursing followup.      Ranelle Oyster, M.D.  Electronically Signed     ZTS/MedQ  D:  05/25/2008 14:32:17  T:  05/26/2008 07:47:18  Job #:  [id number]

## 2010-10-11 NOTE — Assessment & Plan Note (Signed)
Ashley Hall is back regarding her chronic pain.  Apparently she overdosed,  was in the hospital last month at [facility].  Her sister now provides  her medications on a day-to-day basis.  She had been giving her Friday,  Saturday, and Sunday doses all at one time and that is when she began to  get into problems.  The patient rates her pain at 4 out of 10 today.  She describes it as stabbing, aching, and constant.  She has pain in her  neck, shoulders, low back, and knees.   REVIEW OF SYSTEMS:  Notable for depression, confusion, dizziness,  weakness.  Other pertinent positives are listed above.  A full review is  in the written health and history section of the chart.   PHYSICAL EXAMINATION:  VITAL SIGNS:  Blood pressure is 102/49, pulse is  84, respiratory rate 18.  She is sating 96% on room air.  GENERAL:  The patient is pleasant and very alert.  She is quite  cantankerous today.  She has really on concept of the impact of her  situation from last month.  She in fact is requesting more medication  today.  MUSCULOSKELETAL:  She has generalized pain.  No obvious bruising or skin  breakdown.  Facet maneuvers were positive in low back and neck.  She has  pain with flexion as well in both these areas.  HEART:  Regular.  CHEST:  Clear.  ABDOMEN:  Soft, nontender.   ASSESSMENT:  1. Chronic cervicalgia due to facet syndrome and post laminectomy      syndrome.  2. Bipolar disorder.  3. Myofascial pain.  4. Lumbar spondylosis with facet syndrome.  5. Trochanteric bursitis of the hips.  6. Osteoarthritis of the knees.   PLAN:  1. Since her sister and caregiver will be handing her medications on a      daily basis to take, I will continue with the current medications.      If there are any other incidents as the one from the middle of      January, will mean that we cease providing narcotic medications to      this lady.  She has little understanding of the impact and      importance of  these matters.  2. Continue Lidoderm, Topamax, Celebrex, and Cymbalta.  She needs to      take the Cymbalta in the morning as opposed to night which she is      doing now.  3. Encouraged exercise, stretching, range of motion which the patient      laughed at today.  4. I will see her back in 3 months with nurse clinic followup in 1      month's time.      Ranelle Oyster, M.D.  Electronically Signed     ZTS/MedQ  D:  07/12/2007 13:37:07  T:  07/14/2007 14:24:01  Job #:  [id number]

## 2010-10-14 ENCOUNTER — Other Ambulatory Visit: Payer: Self-pay | Admitting: Medical

## 2010-10-14 NOTE — Consult Note (Signed)
NAME:  Ashley Hall, Ashley Hall              ACCOUNT NO.:  [id number]   MEDICAL RECORD NO.:  [id number]          PATIENT TYPE:  INP   LOCATION:  3731                         FACILITY:  [facility]   PHYSICIAN:  Mark E. Karin Golden, M.D.   DATE OF BIRTH:  1945-09-21   DATE OF CONSULTATION:  DATE OF DISCHARGE:  11/04/2004                                   CONSULTATION   This is a consultation for interpretation of intracranial views from a  cerebral arteriogram done on November 03, 2004 by Dr. Hart Rochester.  We have been asked  to interpret the intracranial views from this examination.   Right internal carotid arteriogram:  The internal carotid artery is widely  patent into the brain, without siphon stenosis.  The anterior and middle  cerebral vessels fill normally.  There is a fetal origin of the posterior  cerebral artery.  There is no evidence of intracranial stenosis, aneurysm,  or vascular malformation.  The parenchymal and venous phases are normal.   Left internal carotid arteriogram:  This vessel was widely patent into the  brain.  No siphon stenosis.  The anterior and middle cerebral vessels appear  normal, without stenosis, aneurysm, or vascular malformation.  The  parenchymal and venous phases are normal.   IMPRESSION:  Normal intracranial angiography of the anterior circulation.       MES/MEDQ  D:  11/07/2004  T:  11/07/2004  Job:  [id number]

## 2010-10-14 NOTE — H&P (Signed)
NAME:  Ashley Hall, Ashley Hall              ACCOUNT NO.:  [id number]   MEDICAL RECORD NO.:  [id number]          PATIENT TYPE:  INP   LOCATION:  A206                          FACILITY:  [facility]   PHYSICIAN:  Calvert Cantor, M.D.     DATE OF BIRTH:  [birth year]   DATE OF ADMISSION:  08/31/2004  DATE OF DISCHARGE:  LH                                HISTORY & PHYSICAL   This is a patient of Dr Virgina Organ.  Her psychiatrist is Dr. Duayne Cal in  [place].   The patient is being admitted on Sep 30, 2004.   ADMISSION DIAGNOSES:  1.  Syncope versus seizures.  2.  Ataxia.  3. Horizontal nystagmus.  4.      History of headaches.  5. Lethargy.  6. History of bipolar disorder.  7.      History of chronic obstructive pulmonary disease with possible      bronchitis.  8. History of left arm fracture and head laceration.      Stitches have been removed today.  Left arm fracture needs to be      followed up.   HISTORY OF PRESENT ILLNESS:  This is a 65 year old white female who states  that this morning while getting out of her recliner she fell.  She states  that she passed out.  When she woke up, she was slightly confused.  She was  dizzy.  She was unable to stand.  She noticed that she had urinated on  herself and bitten the inside of her mouth.  Later in the day, her social  worker came to see her and took her Dr. Ned Card.  Dr. Ned Card after seeing  her decided to send her to the ER.  The patient states that she has had  episodes of dizziness on standing and difficulty maintaining her balance for  the past couple of months now.  She states she needs to hold onto things in  order to walk.  She rarely leaves her house.  She does not complain of any  seizures.  She states that she has a headache usually every day.  She wakes  up with this headache.  She takes Esgic 2 pills at least 4 times a day for  her headache which do not help relieve the headache but help to decrease it.  She does not complain of any blurring of  vision.  She does not complain of  any loss of movement in her extremities or any sensory deficits.  She does  not complain of any episodes of dysarthria or any trouble swallowing.  She  states that she had had a decreased appetite, has not been eating for the  past couple of days.  However, she has not lost any weight.   REVIEW OF SYSTEMS:  Other review of systems is positive for leg weakness,  negative for any fever or chest pain, abdominal pain diarrhea, dysuria.   REVIEW OF SYSTEMS:  Positive for shortness of breath with exertion, mild to  moderate cough with brownish colored sputum.   PAST PSYCHOLOGICAL HISTORY:  The patient has a history of  bipolar disorder.  She states that she was recently discharged from [facility] after a 21-day  stay.  She states that her sister was the one who placed her in [place], and she does not know why she did this.  While being in [facility], she fell and apparently hit her head and broke her left forearm.  Her forearm was casted at that time.  She was discharged later.   PAST MEDICAL HISTORY:  In addition to bipolar disorder, the patient has a  history of seizures for more than eight years.  She has been on Dilantin for  these seizures.  She has a history of COPD.  She has a history of headaches  which she states have been ongoing for a few years now.  She only takes  Esgic for these headaches.   MEDICATIONS:  She is on Advair Diskus 1 puff 2 times a day and albuterol  inhaler which she never uses.  She is on Dilantin 300 mg in the morning and  200 mg in the evening.  She takes Geodon 60 mg at bedtime.  She takes  Effexor 150 mg daily, Premarin 1.25 mg daily, Esgic 2 pills 4 times a day, a  vitamin.  She takes Xanax 5 mg up to 3 times a day when she needs it.  She  states that she took one yesterday and 0 today.  She also states that she  had trazodone prescribed for her, however, she threw these pills out  yesterday for unknown  reasons.   She is unsure if she takes phenobarbital.   SOCIAL HISTORY:  She is a smoker, smoking one pack per day since she was 65  years old.  In the past, she used to smoke more than two packs per day.  However, she has cut down.  She has a history of alcohol use, however, she  stopped two years ago.  She has been married twice.  Currently, she is not  married.  She has one son who has had a history of a hip fracture.   ALLERGIES:  No known drug allergies.   FAMILY HISTORY:  Her father died of stomach cancer.  He also had heart  disease.  Her mother passed away recently on 11-05-22.  She had a history of  diabetes.  She had heart problems.  The patient believes her mother died of  a heart attack.  She has two siblings, one brother with paranoid  schizophrenia and one sister whose medical history is [id number].   PHYSICAL EXAMINATION:  Temperature is 98, blood pressure is 143/82, pulse is  about 80-100, respiratory rate is 16, pulse ox is 98-99% on room air.  HEENT:  The patient has a healed laceration on her occipital region.  Her  pupils are equal, round, and reactive to light and accommodation.  She has  horizontal nystagmus when gazing to the left and right.  Her oral mucosa is  moist.  Neck is supple.  There is no JVD.  Heart:  Regular rate and rhythm  with no murmurs.  Lungs are completely clear with no wheezing, no rales.  Abdomen is soft and nontender and nondistended.  Bowel sounds are positive.  Extremities:  Her left knee has an old scar and a new abrasion which she  states is from her fall early this morning.  She also has some small scars  on her feet which appear to be healing well.  She has no cyanosis,  clubbing,  or edema.  Neurologically, cranial nerves II-XII are intact with horizontal  nystagmus in both eyes.  She has no diplopia.  Strength is intact  bilaterally in upper and lower extremities.  Reflexes are +2.  Finger-to- nose test is intact.  Heel-to-shin test is  intact.  Rapid repetitive  movement is also intact.  I have not asked her to walk, however, Dr. Rhae Lerner. Margretta Ditty has done this already, and he states that she had an ataxic  walk.   BLOOD WORK:  Sodium is 136, potassium is 3.2, chloride is 99, glucose 182,  BUN is 1.  Creatinine 0.7, calcium 8.0.  WBC 7.5, hemoglobin 12.1,  hematocrit 34.2, MCV 94.3.  Platelets 410.  A urine drug screen is positive  for benzodiazepines and barbiturates, although the patient does not recall  taking any barbiturates.  CT scan of the head was done.  I do not have a  result, however, Dr. Margretta Ditty has stated that it was normal.   ASSESSMENT/PLAN:  This is a 65 year old white female who gives a sketchy  history of a fall earlier this morning as she was climbing out of her  recliner in which she sleeps.  Judging from the history, it seems that she  might have had a seizure.  However, the patient has trouble recalling  exactly what happened.  The patient has a history of seizures, and she has  been on Dilantin.  Her urine is positive for phenobarbital, but she not  state taking any phenobarbital.  It may be secondary to the Esgic.  Currently, she is going to be admitted to telemetry.  We will obtain an EKG,  2D echo, carotid Dopplers, and EEG.  I will consult Dr. Darleen Crocker A. [place].  She will be on seizure and fall precautions.  She has not had a blood  alcohol level done.  Therefore, that will be drawn.  I am going to resume  her Dilantin, Effexor, Advair, and Premarin.  I will be holding her Geodon,  trazodone, and Esgic.  She will be placed on DVT precautions and receive  Protonix.  Sputum culture will be done to rule out bronchitis.  Chest x-  ray will be done as well.  She has a left arm fracture with a cast that has  been on that was placed about seven or eight weeks ago.  An x-ray of her  left forearm will be done to assess healing and possible removal of the  cast.  She has also hypokalemic.  This will  be replaced.  She will be on bed  rest for now.      SR/MEDQ  D:  08/31/2004  T:  08/31/2004  Job:  [id number]

## 2010-10-14 NOTE — Op Note (Signed)
NAME:  Ashley Hall, Ashley Hall              ACCOUNT NO.:  [id number]   MEDICAL RECORD NO.:  [id number]          PATIENT TYPE:  AMB   LOCATION:  DAY                           FACILITY:  [facility]   PHYSICIAN:  Kassie Mends, M.D.      DATE OF BIRTH:  03/25/1946   DATE OF PROCEDURE:  12/28/2005  DATE OF DISCHARGE:                                 OPERATIVE REPORT   PROCEDURE:  Colonoscopy.   MEDICATIONS:  1. Demerol 100 mg IV.  2. Versed 4 mg IV.   INDICATION FOR EXAM:  [name] is a 65 year old female who presents for  average risk colon cancer screening.   FINDINGS:  1. Moderate amount of retained particulates liquid stool seen throughout      the colon.  The amount of stool limited the exam.  Polyps less than 1      cm could have been easily missed.  2. Submucosal lesion seen in the left colon.  It had a yellow hue and      pillow-like consistency when depressed with the cold forcep and is      likely a lipoma.  Biopsies were obtained with cold forceps.  3. No masses, inflammatory changes, vascular ectasias or diverticula were      seen.  4. Normal retroflex view of the rectum.   RECOMMENDATIONS:  1. Will follow up biopsies.  2. Should have a screening colonoscopy in 5 years.  3. Follow up with Dr. Virgina Hall.   PROCEDURE TECHNIQUE:  Physical exam was performed and informed consent was  obtained from the patient after explaining all risks, benefits and  alternatives to procedure which the patient appeared to understand and so  stated.  The patient was connected to the monitoring device and placed in  the left lateral position.  Continuous oxygen was provided by nasal cannula  and IV medicine administered through an indwelling cannula.  After  administration of sedation and rectal exam, the patient's rectum was  intubated.  The scope was advanced under  direct visualization to the cecum.  The scope was subsequently removed  slowly by carefully examining the color, texture, anatomy and  integrity of  the mucosa on the way out.  The patient was recovered in the endoscopy suite  and discharged to home in satisfactory condition.      Kassie Mends, M.D.  Electronically Signed     SM/MEDQ  D:  12/28/2005  T:  12/28/2005  Job:  [id number]

## 2010-10-14 NOTE — Discharge Summary (Signed)
NAMESADEY, YANDELL              ACCOUNT NO.:  [id number]   MEDICAL RECORD NO.:  [id number]          PATIENT TYPE:  INP   LOCATION:  3731                         FACILITY:  [facility]   PHYSICIAN:  Leighton Roach McDiarmid, M.D.DATE OF BIRTH:  1946-05-26   DATE OF ADMISSION:  10/30/2004  DATE OF DISCHARGE:  11/04/2004                                 DISCHARGE SUMMARY   DISCHARGE DIAGNOSES:  1.  Altered mental status secondary to polymedication.  2.  Bipolar disorder.  3.  Left side subclavian stenosis.  4.  Left distal radial fracture.  5.  Hypertension.  6.  Chronic obstructive pulmonary disease.  7.  History of ataxia.  8.  Seizure disorder.   DISCHARGE MEDICATIONS:  1.  Protonix 40 mg p.o. daily.  2.  Geodon 60 mg p.o. q.h.s.  3.  ___________ 333 mg p.o. t.i.d.  4.  Trazodone 100 mg p.o. q.h.s.  5.  Esgic two tabs p.o. q.i.d.  6.  Combivent MDI two puffs q.6h.  7.  Premarin 125 mg p.o. daily.  8.  Effexor 150 mg p.o. b.i.d.  9.  Xanax 1 mg p.o. t.i.d.  10. Dilantin 1200 mg p.o. b.i.d.  11. Advair 250/50 one puff b.i.d.  12. Vasotec 0.5 mg p.o. daily.  13. Thiamine 100 mg p.o. daily.  14. Iron 325 mg p.o. t.i.d.  15. Hydrochlorothiazide 12.5 mg p.o. daily   PROCEDURE:  1.  Several chest x-rays that showed stable bronchitic changes.  2.  Head CT on June 4th showed no evidence of acute intracranial      abnormality.  3.  Aortic aortogram and carotid angiogram on November 03, 2004, showed an 80% to      90% stenosis of the left subclavian artery, mild bilateral carotid      bifurcation and __________ disease with no significant stenosis, patent      vertebrobasilar blood flow.  4.  Cerebral arteriogram done on June 8th showed normal intracranial      angiography of the anterior circulation.   DISCHARGE INSTRUCTIONS:  The patient was discharged in the company of her  caseworker from [facility] Department. She had a follow-  up appointment with Dr. Vivia Birmingham on  June 13th and also had a follow-up  appointment with her neurologist, Dr. Gerilyn Pilgrim, and is to follow up with her  psychiatrist, Dr. Carmela Rima. The caseworker had all the appointment dates the  week following discharge. She is also being instructed to follow up with Dr.  Althea Charon, orthopedist, to follow her left distal radius fracture. She has  been in a cast for over a month now and they might either remove the cast or  put in her in a splint after assessing the healing process of her fracture.   CONSULTATIONS:  CVTS, Dr. Edilia Bo.   BRIEF HOSPITAL COURSE:  This is a 65 year old female with history of bipolar  disorder, polymedication, seizure disorder, status post Colles fracture  about a month prior to admission, who was admitted on October 31, 2004, for  altered mental status after found by her mental health caseworker very  confused and  almost unresponsive.   PROBLEM #1:  Altered mental status. It is believed it was secondary to multi-  drug overdose. The patient's Dilantin level on admission was 37.8. She also  had a UDS positive for opiates, benzos, and barbiturates. About three hours  after being admitted and treated with IV fluids and holding on her  medications the patient started to awaken, reported that she took extra  pills for pain. She denied suicidal ideation, but reported if she has her  medications available she would take them to stop her pain. By contacting  the patient's primary physician's office, it was revealed to [place] that the  patient had had multiple admissions in other hospitals in [facility]  for multi-drug overdose of her prescribed medication. Her antipsychotic  medications are prescribed by mental health and apparently the caseworker is  trying to manage the patient's prescription that she filled. Even during her  hospital stay the patient had a bottle of Xanax in her room which she  continued to take without informing nurses or the physicians in charge of  her  care. Her Dilantin was on hold until her blood levels lowered to normal  and then she was restarted on her home dose of 200 mg b.i.d. on the day  prior to discharge. Most likely the patient will continue to have problems  with abusing her prescriptions. She could benefit from a contract and good  information between her primary doctor's office and her mental health  office, and neurologist office so that they are make sure who is prescribing  which medication and her caseworker reported that she would work on this  matter. The caseworker would go to the patient's home and would recheck for  any leftover medications and the patient would not have any medications to  take at her discretion, and they would just be provided by her mental health  caseworker.   PROBLEM #2:  Dehydration. On admission the patient was found with clinical  signs of dehydration and also she was found to be hypokalemic and  hyponatremic most likely secondary to decreased p.o. intake and also the use  of hydrochlorothiazide. Her hydrochlorothiazide was held during the first  three days of admission and the patient was hydrated with IV fluids  initially and she was transitioned to p.o. hydration two days prior to  discharge. Her potassium was replaced orally and her IV fluids, and on the  day of discharge her sodium was 132, potassium 4.7, chloride 100, CO2 25,  BUN 4, creatinine 0.6, glucose 101, calcium 7.8. The patient was  asymptomatic.   PROBLEM #3:  Subclavian stenosis. The patient has a history of unstable gait  and frequent falls. She has been followed outpatient by a neurologist, Dr.  Gerilyn Pilgrim. During her last admission at [facility] the diagnosis of mitral  valve insufficiency was entertained. During this admission the patient  underwent arch aortogram with carotid angiogram and intracranial angiography as well. She was found to have a subclavian stenosis on the left side, but  no compromise of the  vertebrobasilar flow. Though her left side subclavian  stenosis was between 80% to 90%, vascular surgery did not recommend surgical  intervention and did not believe that this would be reason for the patient's  history of falls and unbalanced gait. On discharge day, the patient's gait  was pretty stable, she walking around her room and in the hall without  discomfort or loss in her balance. It was recommended for her to continue  to  follow up with her neurologist. Her history of frequent falls and unsteady  gait can very well be related to patient's polymedications.   PROBLEM #4:  Bipolar disorder. After the third day of admission we restarted  all patient's psychiatric medications. She had a very labile effect and was  found to be very histrionic at some point. It was not believed that she was  in a state of depression during her admission. She had more symptoms of  hypermania than mania or depression. She denied any suicidal thoughts and  her mental health caseworker accompanied her. She will follow up during the  week after discharge with her psychiatrist, Dr. Carmela Rima.   PROBLEM #5:  Left distal radial fracture. The patient had over a month's  history of left distal radial fracture. She is status post cath and Dr.  Althea Charon has been following her. We performed another x-ray that showed  partial healing of this fracture. She was instructed to follow up with Dr.  Althea Charon a week or two after she has been discharged for assessment of her  fracture.   DISPOSITION:  The patient was discharged home in the company of her  caseworker from [facility] Department whose name is  Karl Bales in improved and stable condition.      Adlih   AM/MEDQ  D:  01/04/2005  T:  01/04/2005  Job:  [id number]

## 2010-10-14 NOTE — Procedures (Signed)
NAME:  Ashley Hall, MCCREADY              ACCOUNT NO.:  [id number]   MEDICAL RECORD NO.:  [id number]          PATIENT TYPE:  INP   LOCATION:  A206                          FACILITY:  [facility]   PHYSICIAN:  Athol Bing, M.D.  DATE OF BIRTH:  02-06-1946   DATE OF PROCEDURE:  09/01/2004  DATE OF DISCHARGE:                                  ECHOCARDIOGRAM   CLINICAL DATA:  A 65 year old woman with syncope, M-mode aorta 2.9, left  atrium 2.8, septum 1.0, posterior wall 0.8, LV diastole 3.5, LV systole 1.9.   1.  Technically suboptimal but adequate echocardiographic study.  2.  Normal left atrium, right atrium and right ventricle.  3.  Normal mitral valve; mild annular calcification.  4.  Mild to moderate aortic valvular sclerosis without stenosis; mild      annular calcification; trivial in this aortic insufficiency.  5.  Normal tricuspid valve.  6.  Normal left ventricular size; left ventricular wall thickness of the      upper limit of normal; normal regional and global function.  7.  Normal IVC.      RR/MEDQ  D:  09/01/2004  T:  09/02/2004  Job:  [id number]

## 2010-10-14 NOTE — Procedures (Signed)
NAME:  Ashley Hall, Ashley Hall              ACCOUNT NO.:  [id number]   MEDICAL RECORD NO.:  [id number]          PATIENT TYPE:  INP   LOCATION:  A206                          FACILITY:  [facility]   PHYSICIAN:  Kofi A. Gerilyn Pilgrim, M.D. DATE OF BIRTH:  25-Jun-1945   DATE OF PROCEDURE:  DATE OF DISCHARGE:                                EEG INTERPRETATION   This is a 65 year old who has a history of epileptic seizures.   ANALYSIS:  A 16-channel recording was conducted for 20 minutes.  There is a  posterior dominant rhythm that is well formed at 10 Hz.  The quality of the  recording is good.  There is beta activity seen in the frontal area.  Awake  only activity is noted.  Photic stimulation does not elicit any significant  changes in the background activity.  There is no focal slowing, lateralized  slowing or epileptiform activity noted.   IMPRESSION:  This is a normal recording of the awake state.      KAD/MEDQ  D:  09/01/2004  T:  09/01/2004  Job:  [id number]

## 2010-10-14 NOTE — Group Therapy Note (Signed)
NAME:  Ashley Hall, Ashley Hall              ACCOUNT NO.:  [id number]   MEDICAL RECORD NO.:  [id number]          PATIENT TYPE:  INP   LOCATION:  A206                          FACILITY:  [facility]   PHYSICIAN:  Kofi A. Gerilyn Pilgrim, M.D. DATE OF BIRTH:  1946/02/23   DATE OF PROCEDURE:  09/01/2004  DATE OF DISCHARGE:                                   PROGRESS NOTE   The patient is up eating dinner today.  She looks a lot better.  Her skin is  a lot better color.  She is alert and responsive.  Examination shows full  extraocular movements with no nystagmus noted, which is a marked difference  from last p.m.  The EEG was normal from today.  MRI  of the brain shows  significant volume loss for age.  There are mild periventricular ischemic  changes and a single subcortical lesion noted in the right frontal area.  Cervical spine MRI shows an osteophyte with indentation of the ventral  thecal sack space at C5-6.  No myelopathic signs are noted.  There was also  a bulge at C7-T1.  Other laboratory evaluations are pending.   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  Interestingly, her  dilantin level is increased today at 19.  Other laboratory tests are  pending, such as B12.  MRI has ruled out any significant central nervous  system dysfunction at this time.  EEG is normal.  Therefore, will continue  with the current antiepileptic medications.      KAD/MEDQ  D:  09/01/2004  T:  09/01/2004  Job:  [id number]
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
## 2010-11-07 ENCOUNTER — Emergency Department (HOSPITAL_COMMUNITY)
Admission: EM | Admit: 2010-11-07 | Discharge: 2010-11-08 | Disposition: A | Discharge status: Home / Self Care | Payer: Medicare Other | Attending: Emergency Medicine | Admitting: Emergency Medicine

## 2010-11-07 DIAGNOSIS — F411 Generalized anxiety disorder: Secondary | ICD-10-CM | POA: Insufficient documentation

## 2010-11-07 DIAGNOSIS — R110 Nausea: Secondary | ICD-10-CM | POA: Insufficient documentation

## 2010-11-07 DIAGNOSIS — F319 Bipolar disorder, unspecified: Secondary | ICD-10-CM | POA: Insufficient documentation

## 2010-11-07 DIAGNOSIS — I10 Essential (primary) hypertension: Secondary | ICD-10-CM | POA: Insufficient documentation

## 2010-11-07 DIAGNOSIS — K838 Other specified diseases of biliary tract: Secondary | ICD-10-CM | POA: Insufficient documentation

## 2010-11-07 DIAGNOSIS — R109 Unspecified abdominal pain: Secondary | ICD-10-CM | POA: Insufficient documentation

## 2010-11-07 DIAGNOSIS — Z79899 Other long term (current) drug therapy: Secondary | ICD-10-CM | POA: Insufficient documentation

## 2010-11-08 ENCOUNTER — Emergency Department (HOSPITAL_COMMUNITY): Payer: Medicare Other

## 2010-11-08 LAB — URINALYSIS, ROUTINE W REFLEX MICROSCOPIC
Bilirubin Urine: NEGATIVE
Hgb urine dipstick: NEGATIVE
Ketones, ur: NEGATIVE mg/dL
Specific Gravity, Urine: <1.005 — ABNORMAL LOW (ref 1.005–1.030)
pH: 7 (ref 5.0–8.0)

## 2010-11-08 LAB — DIFFERENTIAL
Basophils Absolute: 0 10*3/uL (ref 0.0–0.1)
Basophils Relative: 0 % (ref 0–1)
Eosinophils Relative: 1 % (ref 0–5)
Monocytes Absolute: 0.8 10*3/uL (ref 0.1–1.0)
Monocytes Relative: 9 % (ref 3–12)

## 2010-11-08 LAB — COMPREHENSIVE METABOLIC PANEL
Albumin: 3.6 g/dL (ref 3.5–5.2)
Alkaline Phosphatase: 104 U/L (ref 39–117)
BUN: 15 mg/dL (ref 6–23)
Calcium: 9.5 mg/dL (ref 8.4–10.5)
GFR calc Af Amer: >60 mL/min (ref >60)
Glucose, Bld: 91 mg/dL (ref 70–99)
Potassium: 4.1 mEq/L (ref 3.5–5.1)
Total Protein: 6.9 g/dL (ref 6.0–8.3)

## 2010-11-08 LAB — CBC
HCT: 39.9 % (ref 36.0–46.0)
MCH: 31.7 pg (ref 26.0–34.0)
MCHC: 31.6 g/dL (ref 30.0–36.0)
RDW: 12.9 % (ref 11.5–15.5)

## 2010-11-08 MED ORDER — IOHEXOL 300 MG/ML  SOLN
100.0000 mL | Freq: Once | INTRAMUSCULAR | Status: AC | PRN
  Administered 2010-11-08: 100 mL via INTRAVENOUS

## 2010-11-25 ENCOUNTER — Encounter: Payer: Self-pay | Admitting: Urgent Care

## 2010-11-25 ENCOUNTER — Ambulatory Visit (INDEPENDENT_AMBULATORY_CARE_PROVIDER_SITE_OTHER): Payer: Medicare Other | Admitting: Urgent Care

## 2010-11-25 VITALS — BP 115/67 | HR 82 | Temp 97.5°F | Ht 62.0 in | Wt 170.6 lb

## 2010-11-25 DIAGNOSIS — R109 Unspecified abdominal pain: Secondary | ICD-10-CM | POA: Insufficient documentation

## 2010-11-25 DIAGNOSIS — R932 Abnormal findings on diagnostic imaging of liver and biliary tract: Secondary | ICD-10-CM

## 2010-11-25 NOTE — Progress Notes (Signed)
Referring Provider: Colon Branch, MD Primary Care Physician:  Colon Branch, MD Primary Gastroenterologist:  Dr. Darrick Penna  Chief Complaint  Patient presents with  . Abdominal Pain    hernia pain    HPI:  Ashley Hall is a 65 y.o. female here as a referral from Dr. Virgina Organ for right-sided abd pain.  Worse w/movement.  Thinks she has a hernia "getting bigger & bigger."  BM up to once per week.  Uses glycerin suppositories daily.  Tried miralax, prune juice, lactulose nothing helps.  Hx lifelong constipation.  C/o nausea & vomiting that is intermittent.  Denies jaundice or pruritis.  No etoh.  Wt 10# , trying to lose weight.  +chills, no fever.    Labs in ER 6/11/2->LFTs normal, MCV 100.3, CMP normal, lipase normal. UA normal. CT abd/pelvis 11/08/10 w/ IV contrast->extrahepatic bile duct 14mm, intrahepatics mildly dilated, atherosclerosis, CAD, 11 renal cyst   Past Medical History  Diagnosis Date  . Arthritis   . Chronic back pain     Dr Gerilyn Pilgrim  . Chronic constipation   . S/P colonoscopy 12/28/05    Dr Era Bumpers prep, lipoma left colon  . Sinus drainage   . Alcohol abuse, in remission     Past Surgical History  Procedure Date  . Complete hysterectomy   . Appendectomy     Current Outpatient Prescriptions  Medication Sig Dispense Refill  . albuterol (PROVENTIL) (2.5 MG/3ML) 0.083% nebulizer solution Take 2.5 mg by nebulization every 6 (six) hours as needed.        Marland Kitchen amLODipine (NORVASC) 5 MG tablet Take 5 mg by mouth daily.        . Ascorbic Acid (VITAMIN C) 1000 MG tablet Take 1,000 mg by mouth daily.        . Calcium Carbonate-Vitamin D (OYSTER SHELL CALCIUM 500 + D) 500-125 MG-UNIT TABS Take by mouth.        . celecoxib (CELEBREX) 200 MG capsule Take 200 mg by mouth 2 (two) times daily.        . Cholecalciferol (VITAMIN D) 1000 UNITS capsule Take 1,000 Units by mouth daily.        Marland Kitchen dextroamphetamine (DEXTROSTAT) 10 MG tablet Take 10 mg by mouth daily.        . diazepam  (VALIUM) 10 MG tablet Take 10 mg by mouth every 6 (six) hours as needed.        . DULoxetine (CYMBALTA) 60 MG capsule Take 60 mg by mouth daily.        . ferrous gluconate (FERGON) 325 MG tablet Take 325 mg by mouth daily with breakfast.        . fish oil-omega-3 fatty acids 1000 MG capsule Take 2 g by mouth daily.        Marland Kitchen gabapentin (NEURONTIN) 300 MG capsule Take 300 mg by mouth Daily.      Marland Kitchen ipratropium (ATROVENT HFA) 17 MCG/ACT inhaler Inhale 2 puffs into the lungs every 6 (six) hours.        . lidocaine (LIDODERM) 5 % Place 1 patch onto the skin daily. Remove & Discard patch within 12 hours or as directed by MD       . metoprolol tartrate (LOPRESSOR) 25 MG tablet Take 25 mg by mouth Daily.      . mirtazapine (REMERON) 15 MG tablet Take 15 mg by mouth at bedtime.        . Multiple Vitamin (MULTIVITAMIN) capsule Take 1 capsule by mouth daily.        Marland Kitchen  oxyCODONE-acetaminophen (PERCOCET) 7.5-325 MG per tablet Take 1 tablet by mouth every 4 (four) hours as needed.        . pantoprazole (PROTONIX) 40 MG tablet Take 40 mg by mouth daily.        . Potassium 99 MG TABS Take by mouth.        . sertraline (ZOLOFT) 100 MG tablet Take 100 mg by mouth Daily.      Marland Kitchen topiramate (TOPAMAX) 100 MG tablet Take 100 mg by mouth 2 (two) times daily.        . traZODone (DESYREL) 100 MG tablet Take 100 mg by mouth at bedtime.        . vitamin E 400 UNIT capsule Take 400 Units by mouth daily.          Allergies as of 11/25/2010 - Review Complete 11/25/2010  Allergen Reaction Noted  . Pregabalin     Family History:  There is no known family history of colorectal carcinoma , liver disease, or inflammatory bowel disease.  History   Social History  . Marital Status: Divorced    Spouse Name: N/A    Number of Children: 1  . Years of Education: N/A   Occupational History  . disabled    Social History Main Topics  . Smoking status: Former Smoker -- 1.5 packs/day for 48 years    Types: Cigarettes  .  Smokeless tobacco: Not on file   Comment: smoke the fake cigarettes now  . Alcohol Use: Yes     heavy etoh x1yrs, quit 2004  . Drug Use: No  . Sexually Active: Not on file   Other Topics Concern  . Not on file   Social History Narrative   Lives alone   Review of Systems: Gen: See HPI CV: Denies chest pain, angina, palpitations, syncope, orthopnea, PND, peripheral edema, and claudication. Resp: Denies dyspnea at rest, dyspnea with exercise, cough, sputum, wheezing, coughing up blood, and pleurisy. GI: Denies vomiting blood, jaundice, and fecal incontinence.    GU : Denies urinary burning, blood in urine, urinary frequency, urinary hesitancy, nocturnal urination.  Hx of urinary incontinence. MS: Ambulates w/ walker.  C/o chronic back & joint pain w/ arthritis. Derm: Denies rash, itching, dry skin, hives, moles, warts, or unhealing ulcers.  Psych: Denies depression, anxiety, memory loss, suicidal ideation, hallucinations, paranoia, and confusion. Heme: Denies bruising, bleeding, and enlarged lymph nodes.  Physical Exam: BP 115/67  Pulse 82  Temp(Src) 97.5 F (36.4 C) (Temporal)  Ht 5\' 2"  (1.575 m)  Wt 170 lb 9.6 oz (77.384 kg)  BMI 31.20 kg/m2 General:   Alert,  Well-developed, well-nourished, pleasant and cooperative in NAD.  Ambulates w/ walker. Head:  Normocephalic and atraumatic. Eyes:  Sclera clear, no icterus.   Conjunctiva pink. Ears:  Normal auditory acuity. Nose:  No deformity, discharge,  or lesions. Mouth:  No deformity or lesions, dentition normal. Neck:  Supple; no masses or thyromegaly. Lungs:  Clear throughout to auscultation.   No wheezes, crackles, or rhonchi. No acute distress. Heart:  Regular rate and rhythm; no murmurs, clicks, rubs,  or gallops. Abdomen:  Soft and nondistended. No masses, hepatosplenomegaly or hernias noted. Normal bowel sounds, without guarding, and without rebound.  C/o RUQ tenderness to touch.  Msk:  Symmetrical without gross  deformities. Normal posture. Pulses:  Normal pulses noted. Extremities:  Without clubbing or edema. Neurologic:  Alert and  oriented x4;  grossly normal neurologically. Skin:  Intact without significant lesions or rashes. Cervical Nodes:  No significant cervical adenopathy. Psych:  Alert and cooperative. Normal mood and affect.

## 2010-11-25 NOTE — Progress Notes (Signed)
agree

## 2010-11-25 NOTE — Patient Instructions (Signed)
Follow up with your regular doctor about your cough I will call w/ MRI results Take pain pills you already have for pain if needed To ER if severe pain, nausea or vomiting.

## 2010-11-25 NOTE — Assessment & Plan Note (Addendum)
See abnormal CT.  May be secondary to biliary findings or chronic constipation.  No evidence of hernia.  Await MRCP.  Last colonoscopy poor prep.  Next colonoscopy due 12/2013 with full prep in OR or sooner if needed.  Follow up with your regular doctor about your cough I will call w/ MRI results Take pain pills you already have for pain if needed To ER if severe pain, nausea or vomiting.

## 2010-11-25 NOTE — Assessment & Plan Note (Signed)
Ashley Hall is a 65 y.o. caucasian female w/ right-sided abd pain, nausea & vomiting.  CT did not show any evidence of hernia, however it did show intra&extrahepatic biliary dilation.  She will need MRCP to r/o mass or stricture.  LFTS normal-no evidence of obstruction.  Hx etoh abuse in remission, chronic narcotic pain medication use & chronic constipation.

## 2010-11-28 NOTE — Progress Notes (Signed)
Cc to PCP 

## 2010-12-01 ENCOUNTER — Other Ambulatory Visit: Payer: Self-pay | Admitting: Urgent Care

## 2010-12-01 ENCOUNTER — Ambulatory Visit (HOSPITAL_COMMUNITY)
Admission: RE | Admit: 2010-12-01 | Discharge: 2010-12-01 | Disposition: A | Discharge status: Home / Self Care | Payer: Medicare Other | Source: Ambulatory Visit | Attending: Urgent Care | Admitting: Urgent Care

## 2010-12-01 DIAGNOSIS — R932 Abnormal findings on diagnostic imaging of liver and biliary tract: Secondary | ICD-10-CM

## 2010-12-01 DIAGNOSIS — K869 Disease of pancreas, unspecified: Secondary | ICD-10-CM | POA: Insufficient documentation

## 2010-12-01 DIAGNOSIS — K838 Other specified diseases of biliary tract: Secondary | ICD-10-CM | POA: Insufficient documentation

## 2010-12-01 DIAGNOSIS — Q619 Cystic kidney disease, unspecified: Secondary | ICD-10-CM | POA: Insufficient documentation

## 2010-12-01 MED ORDER — GADOBENATE DIMEGLUMINE 529 MG/ML IV SOLN: 16.0000 mL | Freq: Once | INTRAVENOUS | Status: AC | PRN

## 2010-12-21 ENCOUNTER — Encounter: Payer: Self-pay | Admitting: Gastroenterology

## 2011-01-04 ENCOUNTER — Encounter: Payer: Self-pay | Admitting: Gastroenterology

## 2011-01-05 ENCOUNTER — Telehealth: Payer: Self-pay | Admitting: Gastroenterology

## 2011-01-05 ENCOUNTER — Ambulatory Visit: Payer: Medicare Other | Admitting: Gastroenterology

## 2011-01-05 NOTE — Telephone Encounter (Signed)
noted 

## 2011-01-06 ENCOUNTER — Telehealth: Payer: Self-pay | Admitting: Gastroenterology

## 2011-01-06 ENCOUNTER — Other Ambulatory Visit: Payer: Self-pay | Admitting: Medical

## 2011-01-06 NOTE — Telephone Encounter (Signed)
Re-routed to provider and CM.

## 2011-01-06 NOTE — Telephone Encounter (Signed)
Disregard note.

## 2011-02-16 LAB — RAPID URINE DRUG SCREEN, HOSP PERFORMED
Barbiturates: NOT DETECTED
Opiates: NOT DETECTED

## 2011-02-16 LAB — URINALYSIS, ROUTINE W REFLEX MICROSCOPIC
Bilirubin Urine: NEGATIVE
Glucose, UA: NEGATIVE
Specific Gravity, Urine: 1.01

## 2011-02-16 LAB — BASIC METABOLIC PANEL
CO2: 24
Chloride: 108
GFR calc non Af Amer: >60
Glucose, Bld: 134 — ABNORMAL HIGH
Potassium: 3.1 — ABNORMAL LOW
Sodium: 136

## 2011-02-16 LAB — DIFFERENTIAL
Basophils Absolute: 0
Eosinophils Relative: 1
Lymphocytes Relative: 26
Monocytes Absolute: 0.7

## 2011-02-16 LAB — CBC
HCT: 35.1 — ABNORMAL LOW
Hemoglobin: 11.7 — ABNORMAL LOW
MCHC: 33.4
MCV: 93.1
RDW: 13

## 2011-02-16 LAB — URINE MICROSCOPIC-ADD ON

## 2011-03-14 ENCOUNTER — Other Ambulatory Visit: Payer: Self-pay | Admitting: Medical

## 2011-05-26 ENCOUNTER — Encounter (HOSPITAL_COMMUNITY): Payer: Self-pay | Admitting: Emergency Medicine

## 2011-05-26 ENCOUNTER — Emergency Department (HOSPITAL_COMMUNITY)
Admission: EM | Admit: 2011-05-26 | Discharge: 2011-05-26 | Discharge status: Against Medical Advice | Payer: Medicare Other | Attending: Emergency Medicine | Admitting: Emergency Medicine

## 2011-05-26 DIAGNOSIS — M549 Dorsalgia, unspecified: Secondary | ICD-10-CM | POA: Insufficient documentation

## 2011-05-26 NOTE — ED Notes (Signed)
Pt to waiting room in wheelchair

## 2011-05-26 NOTE — ED Notes (Signed)
Larey Seat a week ago, states she fall frequently, c/o pain in buttocks,

## 2011-05-27 ENCOUNTER — Emergency Department (HOSPITAL_COMMUNITY)
Admission: EM | Admit: 2011-05-27 | Discharge: 2011-05-27 | Disposition: A | Discharge status: Home / Self Care | Payer: Medicare Other | Attending: Emergency Medicine | Admitting: Emergency Medicine

## 2011-05-27 ENCOUNTER — Emergency Department (HOSPITAL_COMMUNITY): Payer: Medicare Other

## 2011-05-27 ENCOUNTER — Encounter (HOSPITAL_COMMUNITY): Payer: Self-pay | Admitting: *Deleted

## 2011-05-27 DIAGNOSIS — G8929 Other chronic pain: Secondary | ICD-10-CM | POA: Insufficient documentation

## 2011-05-27 DIAGNOSIS — S5012XA Contusion of left forearm, initial encounter: Secondary | ICD-10-CM

## 2011-05-27 DIAGNOSIS — K5900 Constipation, unspecified: Secondary | ICD-10-CM | POA: Insufficient documentation

## 2011-05-27 DIAGNOSIS — M549 Dorsalgia, unspecified: Secondary | ICD-10-CM | POA: Insufficient documentation

## 2011-05-27 DIAGNOSIS — M25529 Pain in unspecified elbow: Secondary | ICD-10-CM | POA: Insufficient documentation

## 2011-05-27 DIAGNOSIS — R609 Edema, unspecified: Secondary | ICD-10-CM | POA: Insufficient documentation

## 2011-05-27 DIAGNOSIS — M129 Arthropathy, unspecified: Secondary | ICD-10-CM | POA: Insufficient documentation

## 2011-05-27 DIAGNOSIS — W19XXXA Unspecified fall, initial encounter: Secondary | ICD-10-CM | POA: Insufficient documentation

## 2011-05-27 DIAGNOSIS — F1021 Alcohol dependence, in remission: Secondary | ICD-10-CM | POA: Insufficient documentation

## 2011-05-27 DIAGNOSIS — S5010XA Contusion of unspecified forearm, initial encounter: Secondary | ICD-10-CM | POA: Insufficient documentation

## 2011-05-27 DIAGNOSIS — M25569 Pain in unspecified knee: Secondary | ICD-10-CM | POA: Insufficient documentation

## 2011-05-27 DIAGNOSIS — S51809A Unspecified open wound of unspecified forearm, initial encounter: Secondary | ICD-10-CM | POA: Insufficient documentation

## 2011-05-27 DIAGNOSIS — Z87891 Personal history of nicotine dependence: Secondary | ICD-10-CM | POA: Insufficient documentation

## 2011-05-27 LAB — CBC
MCH: 31.5 pg (ref 26.0–34.0)
MCHC: 32.1 g/dL (ref 30.0–36.0)
Platelets: 180 10*3/uL (ref 150–400)

## 2011-05-27 LAB — BASIC METABOLIC PANEL
Calcium: 8.6 mg/dL (ref 8.4–10.5)
GFR calc non Af Amer: 88 mL/min — ABNORMAL LOW (ref >90)
Glucose, Bld: 117 mg/dL — ABNORMAL HIGH (ref 70–99)
Sodium: 140 mEq/L (ref 135–145)

## 2011-05-27 LAB — D-DIMER, QUANTITATIVE: D-Dimer, Quant: 0.41 ug/mL-FEU (ref 0.00–0.48)

## 2011-05-27 LAB — APTT: aPTT: 39 seconds — ABNORMAL HIGH (ref 24–37)

## 2011-05-27 LAB — PROTIME-INR: Prothrombin Time: 13.4 seconds (ref 11.6–15.2)

## 2011-05-27 MED ORDER — ETODOLAC 500 MG PO TABS: 500.0000 mg | ORAL_TABLET | Freq: Two times a day (BID) | ORAL | Status: AC

## 2011-05-27 MED ORDER — GUAIFENESIN 100 MG/5ML PO SYRP: 100.0000 mg | ORAL_SOLUTION | ORAL | Status: AC | PRN

## 2011-05-27 MED ORDER — KETOROLAC TROMETHAMINE 60 MG/2ML IM SOLN
60.0000 mg | Freq: Once | INTRAMUSCULAR | Status: AC
  Administered 2011-05-27: 60 mg via INTRAMUSCULAR
  Filled 2011-05-27: qty 2

## 2011-05-27 NOTE — ED Notes (Addendum)
Pt arrived back in department post discharge.  Pt reports that she was waiting for ride and went outside to have a cigarette.  States that when she came back in and sat down the chair turned over. Reports pain in left arm and left knee.

## 2011-05-27 NOTE — ED Notes (Signed)
It is reported pt was in waiting room attempting to find ride home, it is reported pt "pretended" to fall beside wheel chair, pt found lying supine on floor A&0x4, security and RN helped pt to wheelchair, pt transported back to room 18, pt able to ambulate to stretcher without difficulty. Pt c/o pain to both arms and legs.

## 2011-05-27 NOTE — ED Provider Notes (Signed)
History     CSN: [id number]  Arrival date & time 05/27/11  0142   None     Chief Complaint  Patient presents with  . Fall    (Consider location/radiation/quality/duration/timing/severity/associated sxs/prior treatment) HPI Comments: Patient is a 65 year old female with a history of hypertension, chronic back pain, frequent falls who presents with left forearm and left leg pain. She states that she falls frequently and thus her family Dr. has arranged for in-home nursing care to help her ambulate, clean and Cook. Several days ago she had a fall on her left forearm, causing several small skin tears which she has been treating with topical Neosporin antibiotic cream. Despite this she feels that she has pain in her elbow and her forearm associated with mild swelling. In addition to her forearm pain she feels left leg swelling and pain. She does admit to being able to ambulate with a walker and with assistance at baseline.  She was seen in the emergency department, triaged and placed in a room but left without treatment prior to physician evaluation earlier this evening.  Pain is moderate, constant, worse with palpation and movement and not associated with fevers vomiting diarrhea or rash/redness.  According to staff the patient had a fall in the waiting room while waiting for a ride. This occurred shortly after she was told by staff that her ride was not going to come until morning. I have reviewed the video tape of the incident, patient had a slightly antalgic gait and while trying to get into the chair fell to the side, to the ground. There was no loss of consciousness  Patient is a 65 y.o. female presenting with fall. The history is provided by the patient.  Fall    Past Medical History  Diagnosis Date  . Arthritis   . Chronic back pain     Dr Gerilyn Pilgrim  . Chronic constipation   . S/P colonoscopy 12/28/05    Dr Era Bumpers prep, lipoma left colon  . Sinus drainage   . Alcohol abuse,  in remission   . COPD (chronic obstructive pulmonary disease)     Past Surgical History  Procedure Date  . Complete hysterectomy   . Appendectomy     History reviewed. No pertinent family history.  History  Substance Use Topics  . Smoking status: Former Smoker -- 1.5 packs/day for 48 years    Types: Cigarettes  . Smokeless tobacco: Not on file   Comment: smoke the fake cigarettes now  . Alcohol Use: Yes     heavy etoh x29yrs, quit 2004    OB History    Grav Para Term Preterm Abortions TAB SAB Ect Mult Living                  Review of Systems  All other systems reviewed and are negative.    Allergies  Pregabalin  Home Medications   Current Outpatient Rx  Name Route Sig Dispense Refill  . ALBUTEROL SULFATE (2.5 MG/3ML) 0.083% IN NEBU Nebulization Take 2.5 mg by nebulization every 6 (six) hours as needed.      Marland Kitchen AMLODIPINE BESYLATE 5 MG PO TABS Oral Take 5 mg by mouth daily.      Marland Kitchen VITAMIN C 1000 MG PO TABS Oral Take 1,000 mg by mouth daily.      Marland Kitchen CALCIUM CARBONATE-VITAMIN D 500-125 MG-UNIT PO TABS Oral Take by mouth.      . CELECOXIB 200 MG PO CAPS Oral Take 200 mg by mouth 2 (  two) times daily.      Marland Kitchen VITAMIN D 1000 UNITS PO CAPS Oral Take 1,000 Units by mouth daily.      Marland Kitchen DEXTROAMPHETAMINE SULFATE 10 MG PO TABS Oral Take 10 mg by mouth daily.      Marland Kitchen DIAZEPAM 10 MG PO TABS Oral Take 10 mg by mouth every 6 (six) hours as needed.      . DULOXETINE HCL 60 MG PO CPEP Oral Take 60 mg by mouth daily.      . ETODOLAC 500 MG PO TABS Oral Take 1 tablet (500 mg total) by mouth 2 (two) times daily. 30 tablet 3  . FERROUS GLUCONATE 325 MG PO TABS Oral Take 325 mg by mouth daily with breakfast.      . OMEGA-3 FATTY ACIDS 1000 MG PO CAPS Oral Take 2 g by mouth daily.      Marland Kitchen GABAPENTIN 300 MG PO CAPS Oral Take 300 mg by mouth Daily.    . GUAIFENESIN 100 MG/5ML PO SYRP Oral Take 5-10 mLs (100-200 mg total) by mouth every 4 (four) hours as needed for cough. 60 mL 0  .  IPRATROPIUM BROMIDE HFA 17 MCG/ACT IN AERS Inhalation Inhale 2 puffs into the lungs every 6 (six) hours.      Marland Kitchen LIDOCAINE 5 % EX PTCH Transdermal Place 1 patch onto the skin daily. Remove & Discard patch within 12 hours or as directed by MD     . METOPROLOL TARTRATE 25 MG PO TABS Oral Take 25 mg by mouth Daily.    Marland Kitchen MIRTAZAPINE 15 MG PO TABS Oral Take 15 mg by mouth at bedtime.      . MULTIVITAMINS PO CAPS Oral Take 1 capsule by mouth daily.      . OXYCODONE-ACETAMINOPHEN 7.5-325 MG PO TABS Oral Take 1 tablet by mouth every 4 (four) hours as needed.      Marland Kitchen PANTOPRAZOLE SODIUM 40 MG PO TBEC Oral Take 40 mg by mouth daily.      Marland Kitchen POTASSIUM 99 MG PO TABS Oral Take by mouth.      . SERTRALINE HCL 100 MG PO TABS Oral Take 100 mg by mouth Daily.    . TOPIRAMATE 100 MG PO TABS Oral Take 100 mg by mouth 2 (two) times daily.      . TRAZODONE HCL 100 MG PO TABS Oral Take 100 mg by mouth at bedtime.      Marland Kitchen VITAMIN E 400 UNITS PO CAPS Oral Take 400 Units by mouth daily.        BP 117/72  Pulse 90  Temp 98.2 F (36.8 C)  Resp 18  Ht 5\' 2"  (1.575 m)  Wt 166 lb (75.297 kg)  BMI 30.36 kg/m2  SpO2 95%  Physical Exam  Nursing note and vitals reviewed. Constitutional: She appears well-developed and well-nourished. No distress.  HENT:  Head: Normocephalic and atraumatic.  Mouth/Throat: Oropharynx is clear and moist. No oropharyngeal exudate.       Atraumatic, no signs of head injury  Eyes: Conjunctivae and EOM are normal. Pupils are equal, round, and reactive to light. Right eye exhibits no discharge. Left eye exhibits no discharge. No scleral icterus.  Neck: Normal range of motion. Neck supple. No JVD present. No thyromegaly present.  Cardiovascular: Normal rate, regular rhythm, normal heart sounds and intact distal pulses.  Exam reveals no gallop and no friction rub.   No murmur heard. Pulmonary/Chest: Effort normal and breath sounds normal. No respiratory distress. She has no wheezes.  She has no  rales.  Abdominal: Soft. Bowel sounds are normal. She exhibits no distension and no mass. There is no tenderness.  Musculoskeletal: Normal range of motion. She exhibits edema ( Bilateral lower extremity 1+ on the right, 2+ on the left pitting edema.) and tenderness ( Tender to palpation over the left forearm, decreased range of motion at the elbow secondary to pain).       Decreased range of motion of left lower extremity secondary to pain in the knee.  Lymphadenopathy:    She has no cervical adenopathy.  Neurological: She is alert. Coordination normal.       Normal mental status, normal ability to lift each leg off the bed, normal grip in both hands, mental status appears normal, alert and oriented  Skin: Skin is warm and dry.       To 1-1/2 cm skin tears to the left dorsal proximal forearm. No discharge, surrounding erythema or warmth. No induration  Psychiatric: She has a normal mood and affect. Her behavior is normal.    ED Course  Procedures (including critical care time)  Labs Reviewed  APTT - Abnormal; Notable for the following:    aPTT 39 (*)    All other components within normal limits  CBC - Abnormal; Notable for the following:    RBC 3.71 (*)    Hemoglobin 11.7 (*)    All other components within normal limits  BASIC METABOLIC PANEL - Abnormal; Notable for the following:    Potassium 3.4 (*)    Glucose, Bld 117 (*)    GFR calc non Af Amer 88 (*)    All other components within normal limits  D-DIMER, QUANTITATIVE  PROTIME-INR   Dg Elbow 2 Views Left  05/27/2011  *RADIOLOGY REPORT*  Clinical Data: Left elbow pain after trauma.  LEFT ELBOW - 2 VIEW  Comparison: 08/31/2004  Findings: The left elbow appears intact. No evidence of acute fracture or subluxation.  No focal bone lesions.  Bone matrix and cortex appear intact.  No abnormal radiopaque densities in the soft tissues.  No significant effusion.  Mild degenerative changes of the coronoid process.  IMPRESSION: No acute  bony abnormalities suggested.  Original Report Authenticated By: Marlon Pel, M.D.   Dg Forearm Left  05/27/2011  *RADIOLOGY REPORT*  Clinical Data: Left forearm pain after falling from wheelchair.  LEFT FOREARM - 2 VIEW  Comparison: 11/03/2004  Findings: Old fracture deformities of the distal left radius and ulna.  Old ununited ossicle of the ulnar styloid process. Degenerative changes in the elbow and carpus.  No acute fractures demonstrated.  IMPRESSION: Old healed fractures of the distal left radius and ulna.  No acute fractures demonstrated.  Original Report Authenticated By: Marlon Pel, M.D.   Dg Knee Complete 4 Views Left  05/27/2011  *RADIOLOGY REPORT*  Clinical Data: Left knee pain after fall  LEFT KNEE - COMPLETE 4+ VIEW  Comparison: 03/30/2007  Findings: Mild degenerative narrowing of the medial compartment. Growth arrest lines in the femoral and tibial metaphyses.  No significant effusion.  No evidence of acute fracture or subluxation.  No significant change since previous study.  IMPRESSION: No acute bony abnormalities demonstrated.  Original Report Authenticated By: Marlon Pel, M.D.     1. Contusion of left forearm       MDM  Patient with ongoing left forearm and left knee pain. This was made slightly worsened after fall in the waiting room. Her neurologic exam is normal, skin exam  significant for small skin tears. Imaging ordered to rule out underlying fracture of the elbow forearm and knee. In addition to this d-dimer and other blood work ordered to rule out source of swelling of lower extremity including DVT. She is relatively immobile at home and with occasional falls is at risk for thromboembolism. At this time she has no hypoxia, tachycardia or hypotension.      Imaging negative for fracture, blood work negative for DVT , no significant anemia or electrolyte abnormalities. Pain medication given. We'll discharge home  Vida Roller, MD 05/27/11  [phone]

## 2011-10-03 ENCOUNTER — Other Ambulatory Visit: Payer: Self-pay | Admitting: Medical

## 2012-03-28 ENCOUNTER — Other Ambulatory Visit (HOSPITAL_COMMUNITY): Payer: Self-pay | Admitting: Urology

## 2012-03-28 DIAGNOSIS — M549 Dorsalgia, unspecified: Secondary | ICD-10-CM

## 2012-03-28 DIAGNOSIS — R32 Unspecified urinary incontinence: Secondary | ICD-10-CM

## 2012-03-28 DIAGNOSIS — R109 Unspecified abdominal pain: Secondary | ICD-10-CM

## 2012-04-08 ENCOUNTER — Ambulatory Visit (HOSPITAL_COMMUNITY): Payer: Medicaid Other

## 2012-04-10 ENCOUNTER — Ambulatory Visit (HOSPITAL_COMMUNITY): Payer: Medicare Other | Attending: Urology

## 2012-04-10 DIAGNOSIS — R4182 Altered mental status, unspecified: Secondary | ICD-10-CM

## 2012-04-18 ENCOUNTER — Ambulatory Visit (HOSPITAL_COMMUNITY): Payer: Medicare Other | Attending: Urology

## 2012-04-18 ENCOUNTER — Other Ambulatory Visit (HOSPITAL_COMMUNITY): Payer: Medicare Other

## 2012-04-22 ENCOUNTER — Ambulatory Visit (HOSPITAL_COMMUNITY)
Admission: RE | Admit: 2012-04-22 | Discharge: 2012-04-22 | Disposition: A | Discharge status: Home / Self Care | Payer: Medicare Other | Source: Ambulatory Visit | Attending: Urology | Admitting: Urology

## 2012-04-22 ENCOUNTER — Other Ambulatory Visit (HOSPITAL_COMMUNITY): Payer: Medicare Other

## 2012-04-22 DIAGNOSIS — R32 Unspecified urinary incontinence: Secondary | ICD-10-CM | POA: Insufficient documentation

## 2012-04-22 DIAGNOSIS — M549 Dorsalgia, unspecified: Secondary | ICD-10-CM

## 2012-04-22 DIAGNOSIS — R109 Unspecified abdominal pain: Secondary | ICD-10-CM | POA: Insufficient documentation

## 2012-05-27 ENCOUNTER — Other Ambulatory Visit: Payer: Self-pay

## 2012-05-27 NOTE — Telephone Encounter (Signed)
Dawn can you please make her an appointment.

## 2012-05-27 NOTE — Telephone Encounter (Signed)
Pt is aware of OV on 1/16 at 11 with KJ

## 2012-05-27 NOTE — Telephone Encounter (Signed)
Left message for pt to call back  °

## 2012-05-27 NOTE — Telephone Encounter (Signed)
Pt needs FU OV re: biliary dilation, constipation prior to further RFs. Thanks

## 2012-05-28 ENCOUNTER — Other Ambulatory Visit: Payer: Self-pay

## 2012-06-03 NOTE — Telephone Encounter (Signed)
Pt is to sick to come in anytime soon. She will have to call back. She would like to know if she could some a refill until she is better.

## 2012-06-03 NOTE — Telephone Encounter (Signed)
Rx called in to [facility] at [facility]

## 2012-06-03 NOTE — Telephone Encounter (Signed)
Can give RF Amitiza daily for constipation, #30, 0RF  for 1  Month. Needs OV prior to any further refills, Thanks

## 2012-06-13 ENCOUNTER — Ambulatory Visit: Payer: Medicare Other | Admitting: Urgent Care

## 2012-07-03 ENCOUNTER — Emergency Department (HOSPITAL_COMMUNITY)
Admission: EM | Admit: 2012-07-03 | Discharge: 2012-07-03 | Disposition: A | Discharge status: Home / Self Care | Payer: Medicare Other | Attending: Emergency Medicine | Admitting: Emergency Medicine

## 2012-07-03 ENCOUNTER — Encounter (HOSPITAL_COMMUNITY): Payer: Self-pay

## 2012-07-03 DIAGNOSIS — R45850 Homicidal ideations: Secondary | ICD-10-CM | POA: Insufficient documentation

## 2012-07-03 DIAGNOSIS — F1011 Alcohol abuse, in remission: Secondary | ICD-10-CM | POA: Insufficient documentation

## 2012-07-03 DIAGNOSIS — Z791 Long term (current) use of non-steroidal anti-inflammatories (NSAID): Secondary | ICD-10-CM | POA: Insufficient documentation

## 2012-07-03 DIAGNOSIS — G8929 Other chronic pain: Secondary | ICD-10-CM | POA: Insufficient documentation

## 2012-07-03 DIAGNOSIS — Z87891 Personal history of nicotine dependence: Secondary | ICD-10-CM | POA: Insufficient documentation

## 2012-07-03 DIAGNOSIS — F10929 Alcohol use, unspecified with intoxication, unspecified: Secondary | ICD-10-CM

## 2012-07-03 DIAGNOSIS — Z8709 Personal history of other diseases of the respiratory system: Secondary | ICD-10-CM | POA: Insufficient documentation

## 2012-07-03 DIAGNOSIS — M549 Dorsalgia, unspecified: Secondary | ICD-10-CM | POA: Insufficient documentation

## 2012-07-03 DIAGNOSIS — Z85038 Personal history of other malignant neoplasm of large intestine: Secondary | ICD-10-CM | POA: Insufficient documentation

## 2012-07-03 DIAGNOSIS — R45851 Suicidal ideations: Secondary | ICD-10-CM

## 2012-07-03 DIAGNOSIS — J449 Chronic obstructive pulmonary disease, unspecified: Secondary | ICD-10-CM | POA: Insufficient documentation

## 2012-07-03 DIAGNOSIS — J4489 Other specified chronic obstructive pulmonary disease: Secondary | ICD-10-CM | POA: Insufficient documentation

## 2012-07-03 DIAGNOSIS — Z79899 Other long term (current) drug therapy: Secondary | ICD-10-CM | POA: Insufficient documentation

## 2012-07-03 DIAGNOSIS — R51 Headache: Secondary | ICD-10-CM | POA: Insufficient documentation

## 2012-07-03 DIAGNOSIS — Z8719 Personal history of other diseases of the digestive system: Secondary | ICD-10-CM | POA: Insufficient documentation

## 2012-07-03 DIAGNOSIS — M129 Arthropathy, unspecified: Secondary | ICD-10-CM | POA: Insufficient documentation

## 2012-07-03 LAB — COMPREHENSIVE METABOLIC PANEL
ALT: 13 U/L (ref 0–35)
AST: 23 U/L (ref 0–37)
Albumin: 3.8 g/dL (ref 3.5–5.2)
Alkaline Phosphatase: 95 U/L (ref 39–117)
GFR calc Af Amer: >90 mL/min (ref >90)
Glucose, Bld: 101 mg/dL — ABNORMAL HIGH (ref 70–99)
Potassium: 3.3 mEq/L — ABNORMAL LOW (ref 3.5–5.1)
Sodium: 145 mEq/L (ref 135–145)
Total Protein: 7 g/dL (ref 6.0–8.3)

## 2012-07-03 LAB — RAPID URINE DRUG SCREEN, HOSP PERFORMED
Amphetamines: NOT DETECTED
Barbiturates: POSITIVE — AB
Benzodiazepines: NOT DETECTED
Cocaine: NOT DETECTED
Tetrahydrocannabinol: NOT DETECTED

## 2012-07-03 LAB — CBC WITH DIFFERENTIAL/PLATELET
Basophils Absolute: 0 10*3/uL (ref 0.0–0.1)
Eosinophils Absolute: 0 10*3/uL (ref 0.0–0.7)
Lymphs Abs: 3.3 10*3/uL (ref 0.7–4.0)
MCH: 32.3 pg (ref 26.0–34.0)
Neutrophils Relative %: 49 % (ref 43–77)
Platelets: 171 10*3/uL (ref 150–400)
RBC: 4.05 MIL/uL (ref 3.87–5.11)
RDW: 14.1 % (ref 11.5–15.5)
WBC: 7.6 10*3/uL (ref 4.0–10.5)

## 2012-07-03 LAB — URINALYSIS, ROUTINE W REFLEX MICROSCOPIC
Bilirubin Urine: NEGATIVE
Ketones, ur: NEGATIVE mg/dL
Leukocytes, UA: NEGATIVE
Nitrite: NEGATIVE
Protein, ur: NEGATIVE mg/dL
Urobilinogen, UA: 0.2 mg/dL (ref 0.0–1.0)

## 2012-07-03 MED ORDER — ACETAMINOPHEN 325 MG PO TABS
650.0000 mg | ORAL_TABLET | Freq: Once | ORAL | Status: AC
  Administered 2012-07-03: 650 mg via ORAL
  Filled 2012-07-03: qty 2

## 2012-07-03 MED ORDER — ZIPRASIDONE MESYLATE 20 MG IM SOLR
INTRAMUSCULAR | Status: AC
  Filled 2012-07-03: qty 20

## 2012-07-03 MED ORDER — ZIPRASIDONE MESYLATE 20 MG IM SOLR
10.0000 mg | Freq: Once | INTRAMUSCULAR | Status: AC
  Administered 2012-07-03: 10 mg via INTRAMUSCULAR

## 2012-07-03 NOTE — Discharge Instructions (Signed)
You are to go to [facility] tomorrow as a walk-in to be seen by Emergency Services to get back on your medications. You will be meeting the police when you get home to have them take your gun for your own safety for now.

## 2012-07-03 NOTE — ED Notes (Signed)
Pt admits that she was having HI tonight and states she threatened to kill her daughter in law because she wouldn't let her son answer the phone tonight.  Pt admits to calling 911 due to same, and was brought to the e.d. By [employer] Officer.

## 2012-07-03 NOTE — ED Notes (Signed)
Pt is uncooperative at this time and threatening to hit staff. Pt was given geodon injection.

## 2012-07-03 NOTE — ED Notes (Signed)
Per edp, pt is to be discharged home - [place] PD is to be contacted upon pt discharge to meet pt at her house to remove handgun from pt. For safety.   Pt signed consent for [place] [employer] to do so and consent was faxed to [place] [employer].

## 2012-07-03 NOTE — BH Assessment (Addendum)
Assessment Note   Ashley Hall is an 67 y.o. female. The patient was brought to the ED last night by [place] Police. She had called, stating that she wanted to kill her daughter-in -law. She also said that if law enforcement came to her home she would blow her head off. She was upset that her daughter-in-law would not let her speak to her son. The patient remained agitated and combative on admission and was sedated. Today she denies any thoughts to hurt herself. She denies any previous attempts. She remains angry at her daughter-in-law but denies any thoughts to harm her. She denies a history of violence. She complains of chronic pain. She has been without most  Of her medications. She has a history of alcohol abuse with multiple hospital admissions at [facility]. She had been sober since 2004, but relapsed 2 weeks ago. She reports that she was drinking because she had no pain medications. She was drinking Vodka/  Daily/2-3 shots. She is scheduled to go to [facility] today at 1:00PM for an appointment with Dr Verlon Au, for medications.  Axis I:  Mood Disorder NOS;Alcohol Abuse Axis II: Deferred Axis III:  Past Medical History  Diagnosis Date  . Arthritis   . Chronic back pain     Dr Gerilyn Pilgrim  . Chronic constipation   . S/P colonoscopy 12/28/05    Dr Era Bumpers prep, lipoma left colon  . Sinus drainage   . Alcohol abuse, in remission   . COPD (chronic obstructive pulmonary disease)    Axis IV: problems with access to health care services and problems with primary support group Axis V: 41-50 serious symptoms  Past Medical History:  Past Medical History  Diagnosis Date  . Arthritis   . Chronic back pain     Dr Gerilyn Pilgrim  . Chronic constipation   . S/P colonoscopy 12/28/05    Dr Era Bumpers prep, lipoma left colon  . Sinus drainage   . Alcohol abuse, in remission   . COPD (chronic obstructive pulmonary disease)     Past Surgical History  Procedure Date  . Complete hysterectomy    . Appendectomy     Family History: No family history on file.  Social History:  reports that she has quit smoking. Her smoking use included Cigarettes. She has a 72 pack-year smoking history. She does not have any smokeless tobacco history on file. She reports that she drinks alcohol. She reports that she does not use illicit drugs.  Additional Social History:     CIWA: CIWA-Ar BP: 154/69 mmHg Pulse Rate: 84  COWS:    Allergies:  Allergies  Allergen Reactions  . Pregabalin     REACTION: "couldn't move"    Home Medications:  (Not in a hospital admission)  OB/GYN Status:  No LMP recorded. Patient has had a hysterectomy.  General Assessment Data Location of Assessment: [facility] ACT Assessment: Yes Living Arrangements: Alone Can pt return to current living arrangement?: Yes Admission Status: Voluntary Is patient capable of signing voluntary admission?: Yes Transfer from: Acute Hospital Referral Source: MD  Education Status Is patient currently in school?: No  Risk to self Suicidal Ideation: No-Not Currently/Within Last 6 Months Suicidal Intent: No-Not Currently/Within Last 6 Months Is patient at risk for suicide?: No Suicidal Plan?: No-Not Currently/Within Last 6 Months (did tello police while intoxicated that she would blow her h) Access to Means: No What has been your use of drugs/alcohol within the last 12 months?: history of abuse relapse 2 weeks ago;daily etoh  use Previous Attempts/Gestures: No How many times?: 0  Other Self Harm Risks:  hs resumed etoh use Triggers for Past Attempts: None known Intentional Self Injurious Behavior: None Family Suicide History: No Recent stressful life event(s): Conflict (Comment);Recent negative physical changes (on going conflict with daughter in law/increased psysical pa) Persecutory voices/beliefs?: No Depression: Yes Depression Symptoms: Isolating;Fatigue;Feeling angry/irritable Substance abuse history and/or treatment for  substance abuse?: Yes (drinking daily 2 weeks) Suicide prevention information given to non-admitted patients: Yes  Risk to Others Homicidal Ideation: No-Not Currently/Within Last 6 Months Thoughts of Harm to Others: No-Not Currently Present/Within Last 6 Months Current Homicidal Intent: No-Not Currently/Within Last 6 Months Current Homicidal Plan: No-Not Currently/Within Last 6 Months Access to Homicidal Means: No Identified Victim: daughter in law History of harm to others?: No Assessment of Violence: On admission Violent Behavior Description: agitated;threatening;was sedated Does patient have access to weapons?: No Criminal Charges Pending?: No Does patient have a court date: No  Psychosis Hallucinations: None noted Delusions: None noted  Mental Status Report Appear/Hygiene: Improved Eye Contact: Good Motor Activity: Freedom of movement Speech: Rapid;Logical/coherent Level of Consciousness: Alert Mood: Depressed;Angry;Irritable Affect: Blunted;Irritable Anxiety Level: Minimal Thought Processes: Coherent;Circumstantial Judgement: Unimpaired Orientation: Person;Place;Time Obsessive Compulsive Thoughts/Behaviors: Minimal  Cognitive Functioning Concentration: Decreased Memory: Recent Intact;Remote Intact IQ: Average Insight: Fair Impulse Control: Fair Appetite: Good Sleep: No Change Total Hours of Sleep: 5  Vegetative Symptoms: None  ADLScreening [facility]) Patient's cognitive ability adequate to safely complete daily activities?: Yes Patient able to express need for assistance with ADLs?: Yes Independently performs ADLs?: Yes (appropriate for developmental age)  Abuse/Neglect [facility]) Physical Abuse: Denies Verbal Abuse: Denies Sexual Abuse: Denies  Prior Inpatient Therapy Prior Inpatient Therapy: Yes Prior Therapy Dates: 2000- 2010 Prior Therapy Facilty/Provider(s): multiple admission to [facility] Reason for Treatment: alcohol abuse  Prior  Outpatient Therapy Prior Outpatient Therapy: Yes Prior Therapy Dates: 2000-2014 Prior Therapy Facilty/Provider(s): [facility]  Reason for Treatment: Recovery  ADL Screening (condition at time of admission) Patient's cognitive ability adequate to safely complete daily activities?: Yes Patient able to express need for assistance with ADLs?: Yes Independently performs ADLs?: Yes (appropriate for developmental age)       Abuse/Neglect Assessment (Assessment to be complete while patient is alone) Physical Abuse: Denies Verbal Abuse: Denies Sexual Abuse: Denies Values / Beliefs Cultural Requests During Hospitalization: None Spiritual Requests During Hospitalization: None        Additional Information 1:1 In Past 12 Months?: No CIRT Risk: No Elopement Risk: No Does patient have medical clearance?: Yes     Disposition: Talked with Dr Lynelle Doctor after she had seen the patient there is concern about the patient having sometype firearm at home.  Dr Lynelle Doctor ordered a tele-psych evaluation to determine if the patient can go out patient or will be sent to inpatient treatment.Disposition Disposition of Patient: Outpatient treatment;Referred to (has appointment at [facility] 07/03/2012@1 :00 PM) Type of outpatient treatment: Adult Patient referred to: Other (Comment) ([facility] )  On Site Evaluation by:   Reviewed with Physician:     Jearld Pies 07/03/2012 9:35 AM

## 2012-07-03 NOTE — ED Notes (Signed)
Pt has unsteady gait and was assisted to bedside toilet to get urine sample.

## 2012-07-03 NOTE — ED Notes (Signed)
Pt is asleep at this time with sitter at bedside.

## 2012-07-03 NOTE — ED Provider Notes (Signed)
History     CSN: [id number]  Arrival date & time 07/03/12  0158   First MD Initiated Contact with Patient 07/03/12 0215      Chief Complaint  Patient presents with  . V70.1    (Consider location/radiation/quality/duration/timing/severity/associated sxs/prior treatment) HPI Comments: Patient brought in for evaluation of homicidal ideation.  She reports to me that she has had a bad relationship with her daughter-in-law.  Apparently became very angry with her this evening and threatened to kill her several times.  The patient called the police station tonight informing them of this and also expressed a desire to kill herself as well.  She denies suicidal ideation to me at present.  When I ask her if she still feels this way, she replies "I'm going to kill the bitch".  She tells me she has a history of chronic pain and when she is out of pain medicine, she drinks.  She admits to drinking vodka in excess this evening but denies other drug use.  She reports a headache but no other complaints.    She has a history of copd, depression and is on multiple medications for this.    The history is provided by the patient.    Past Medical History  Diagnosis Date  . Arthritis   . Chronic back pain     Dr Gerilyn Pilgrim  . Chronic constipation   . S/P colonoscopy 12/28/05    Dr Era Bumpers prep, lipoma left colon  . Sinus drainage   . Alcohol abuse, in remission   . COPD (chronic obstructive pulmonary disease)     Past Surgical History  Procedure Date  . Complete hysterectomy   . Appendectomy     No family history on file.  History  Substance Use Topics  . Smoking status: Former Smoker -- 1.5 packs/day for 48 years    Types: Cigarettes  . Smokeless tobacco: Not on file     Comment: smoke the fake cigarettes now  . Alcohol Use: Yes     Comment: heavy etoh x34yrs, quit 2004    OB History    Grav Para Term Preterm Abortions TAB SAB Ect Mult Living                  Review of Systems   Neurological: Positive for headaches.  Psychiatric/Behavioral:       As above.  All other systems reviewed and are negative.    Allergies  Pregabalin  Home Medications   Current Outpatient Rx  Name  Route  Sig  Dispense  Refill  . ALBUTEROL SULFATE (2.5 MG/3ML) 0.083% IN NEBU   Nebulization   Take 2.5 mg by nebulization every 6 (six) hours as needed.           Marland Kitchen AMLODIPINE BESYLATE 5 MG PO TABS   Oral   Take 5 mg by mouth daily.           Marland Kitchen VITAMIN C 1000 MG PO TABS   Oral   Take 1,000 mg by mouth daily.           Marland Kitchen CALCIUM CARBONATE-VITAMIN D 500-125 MG-UNIT PO TABS   Oral   Take by mouth.           . CELECOXIB 200 MG PO CAPS   Oral   Take 200 mg by mouth 2 (two) times daily.           Marland Kitchen VITAMIN D 1000 UNITS PO CAPS   Oral   Take 1,000  Units by mouth daily.           Marland Kitchen DEXTROAMPHETAMINE SULFATE 10 MG PO TABS   Oral   Take 10 mg by mouth daily.           Marland Kitchen DIAZEPAM 10 MG PO TABS   Oral   Take 10 mg by mouth every 6 (six) hours as needed.           . DULOXETINE HCL 60 MG PO CPEP   Oral   Take 60 mg by mouth daily.           Marland Kitchen FERROUS GLUCONATE 325 MG PO TABS   Oral   Take 325 mg by mouth daily with breakfast.           . OMEGA-3 FATTY ACIDS 1000 MG PO CAPS   Oral   Take 2 g by mouth daily.           Marland Kitchen GABAPENTIN 300 MG PO CAPS   Oral   Take 300 mg by mouth Daily.         . IPRATROPIUM BROMIDE HFA 17 MCG/ACT IN AERS   Inhalation   Inhale 2 puffs into the lungs every 6 (six) hours.           Marland Kitchen LIDOCAINE 5 % EX PTCH   Transdermal   Place 1 patch onto the skin daily. Remove & Discard patch within 12 hours or as directed by MD          . METOPROLOL TARTRATE 25 MG PO TABS   Oral   Take 25 mg by mouth Daily.         Marland Kitchen MIRTAZAPINE 15 MG PO TABS   Oral   Take 15 mg by mouth at bedtime.           . MULTIVITAMINS PO CAPS   Oral   Take 1 capsule by mouth daily.           . OXYCODONE-ACETAMINOPHEN 7.5-325 MG PO  TABS   Oral   Take 1 tablet by mouth every 4 (four) hours as needed.           Marland Kitchen PANTOPRAZOLE SODIUM 40 MG PO TBEC   Oral   Take 40 mg by mouth daily.           Marland Kitchen POTASSIUM 99 MG PO TABS   Oral   Take by mouth.           . SERTRALINE HCL 100 MG PO TABS   Oral   Take 100 mg by mouth Daily.         . TOPIRAMATE 100 MG PO TABS   Oral   Take 100 mg by mouth 2 (two) times daily.           . TRAZODONE HCL 100 MG PO TABS   Oral   Take 100 mg by mouth at bedtime.           Marland Kitchen VITAMIN E 400 UNITS PO CAPS   Oral   Take 400 Units by mouth daily.             BP 154/69  Pulse 84  Temp 97.7 F (36.5 C) (Oral)  Resp 18  Ht 5\' 2"  (1.575 m)  Wt 125 lb (56.7 kg)  BMI 22.86 kg/m2  SpO2 96%  Physical Exam  Nursing note and vitals reviewed. Constitutional: She is oriented to person, place, and time. She appears well-developed and well-nourished. No distress.  She has the odor of alcohol present.  HENT:  Head: Normocephalic and atraumatic.  Neck: Normal range of motion. Neck supple.  Cardiovascular: Normal rate and regular rhythm.  Exam reveals no gallop and no friction rub.   No murmur heard. Pulmonary/Chest: Effort normal and breath sounds normal. No respiratory distress. She has no wheezes.  Abdominal: Soft. Bowel sounds are normal. She exhibits no distension. There is no tenderness.  Musculoskeletal: Normal range of motion.  Neurological: She is alert and oriented to person, place, and time.  Skin: Skin is warm and dry. She is not diaphoretic.  Psychiatric:       She is somewhat agitated and is currently homicidal, informing she is "going to kill the bitch" (referring to her daughter-in-law.)    ED Course  Procedures (including critical care time)   Labs Reviewed  CBC WITH DIFFERENTIAL  COMPREHENSIVE METABOLIC PANEL  ETHANOL  URINE RAPID DRUG SCREEN (HOSP PERFORMED)  URINALYSIS, ROUTINE W REFLEX MICROSCOPIC   No results found.   No diagnosis  found.   Date: 07/03/2012  Rate: 62  Rhythm: normal sinus rhythm  QRS Axis: normal  Intervals: normal  ST/T Wave abnormalities: nonspecific T wave changes  Conduction Disutrbances:none  Narrative Interpretation:   Old EKG Reviewed: changes noted    MDM  The patient was brought here by authorities for evaluation after calling the station and making threats against herself and her daughter-in-law.  She is intoxicated with a blood alcohol of 178 and has been uncooperative while here.  She made threatening remarks toward the phlebotomist who attempted to draw her blood, and required geodon for sedation.  She will be evaluated by the act team once a sufficient time has passed for her to metabolize the alcohol in her system.            Geoffery Lyons, MD 07/03/12 [phone]

## 2012-07-03 NOTE — ED Notes (Signed)
Pt requesting meds for pain and anxiety.  edp notified.  Pt getting anxious, pacing.  Sitter at bedside.

## 2012-07-03 NOTE — ED Provider Notes (Signed)
This chart was scribed for Ward Givens, MD by Charolett Bumpers, ED Scribe. The patient was seen in room APA17/APA17. Patient's care was started at 1000.  10:00-Pt states that she threatened her daughter-in-law because she won't let her see her son or grandson. She reports she has had problems over the past 18 years with the daughter-in-law. Tommy from [facility] states pt has aid that assists the pt in home. She states that she ambulates with a walker and aid assists her with daily tasks. Pt doesn't driver and states she cannot get to daughter-in-laws house. She denies any current SI today but states she told the [employer] officer last night she did. She states she drinks when her chronic pain is severe over the past 2 weeks and was drinking last night. She has fire-arms in home. She has an appointment with psychiatrist today at [facility] and has not seen a psychiatrist in the past 3-6 months.   10:11-Spoke with Tommy with [facility], will order Telepsych.   12:25-Spoke with the tele psychiatrist regarding the pt's case.    Tele-psych consult done by Dr. Leretha Pol who recommends patient can be discharged to her local psychiatrist. However part of the agreement of her being discharged is that the patient relinquishes her gun to the police department. She stated to keep her on her current medications. [facility] states they cannot see her until March 18th.   Tommy, [facility] has talked to [facility] and they should be able to see her tomorrow as an emergency visit for medications, that is being arranged.   15:16 Victorino Dike from [facility] states she can come tomorrow as a walk-in to [facility] to get back on her medications. I will have the secretary fax the telepsych consult and ED records to them at [phone].  I am going to have the nurse call the police to let them know when she is discharged so they can retrieve her gun for safety.     PCP: Dr. Loney Hering   Diagnoses that have been ruled out:  None  Diagnoses that are  still under consideration:  None  Final diagnoses:  Homicidal ideation  Suicide threat or attempt  Alcohol intoxication    Plan discharge  Devoria Albe, MD, FACEP    I personally performed the services described in this documentation, which was scribed in my presence. The recorded information has been reviewed and considered.  Devoria Albe, MD, Armando Gang    Ward Givens, MD 07/03/12 [phone]

## 2012-07-03 NOTE — ED Notes (Signed)
Talked to [facility]

## 2012-07-03 NOTE — ED Notes (Signed)
Pt belonging in ems bay and security has pt's pocketbook. Pt placed in paper scrubs and wanded by security.

## 2012-09-02 ENCOUNTER — Other Ambulatory Visit (HOSPITAL_COMMUNITY): Payer: Self-pay | Admitting: Family Medicine

## 2012-09-02 ENCOUNTER — Ambulatory Visit (HOSPITAL_COMMUNITY)
Admission: RE | Admit: 2012-09-02 | Discharge: 2012-09-02 | Disposition: A | Discharge status: Home / Self Care | Payer: Medicare Other | Source: Ambulatory Visit | Attending: Family Medicine | Admitting: Family Medicine

## 2012-09-02 DIAGNOSIS — M51379 Other intervertebral disc degeneration, lumbosacral region without mention of lumbar back pain or lower extremity pain: Secondary | ICD-10-CM | POA: Insufficient documentation

## 2012-09-02 DIAGNOSIS — M1990 Unspecified osteoarthritis, unspecified site: Secondary | ICD-10-CM

## 2012-09-02 DIAGNOSIS — M5137 Other intervertebral disc degeneration, lumbosacral region: Secondary | ICD-10-CM | POA: Insufficient documentation

## 2012-09-02 DIAGNOSIS — M5030 Other cervical disc degeneration, unspecified cervical region: Secondary | ICD-10-CM | POA: Insufficient documentation

## 2012-09-02 DIAGNOSIS — M47812 Spondylosis without myelopathy or radiculopathy, cervical region: Secondary | ICD-10-CM | POA: Insufficient documentation

## 2012-12-12 ENCOUNTER — Ambulatory Visit (HOSPITAL_COMMUNITY)
Admission: RE | Admit: 2012-12-12 | Discharge: 2012-12-12 | Disposition: A | Discharge status: Home / Self Care | Payer: Medicare Other | Source: Ambulatory Visit | Attending: Family Medicine | Admitting: Family Medicine

## 2012-12-12 ENCOUNTER — Other Ambulatory Visit (HOSPITAL_COMMUNITY): Payer: Self-pay | Admitting: Family Medicine

## 2012-12-12 DIAGNOSIS — M25519 Pain in unspecified shoulder: Secondary | ICD-10-CM | POA: Insufficient documentation

## 2012-12-12 DIAGNOSIS — M1990 Unspecified osteoarthritis, unspecified site: Secondary | ICD-10-CM

## 2012-12-12 DIAGNOSIS — M5030 Other cervical disc degeneration, unspecified cervical region: Secondary | ICD-10-CM | POA: Insufficient documentation

## 2012-12-12 DIAGNOSIS — M542 Cervicalgia: Secondary | ICD-10-CM | POA: Insufficient documentation

## 2013-01-02 IMAGING — US US RENAL
1 series · 14 of 25 positions shown · non-contrast
Comparison: CT scan 11/08/2010

CLINICAL DATA: Right flank pain, back pain

RENAL/URINARY TRACT ULTRASOUND
TECHNIQUE: Renal ultrasound

[Series 1: us renal · 0.26mm/px · 14 of 27 slices shown]
[im 1/27]
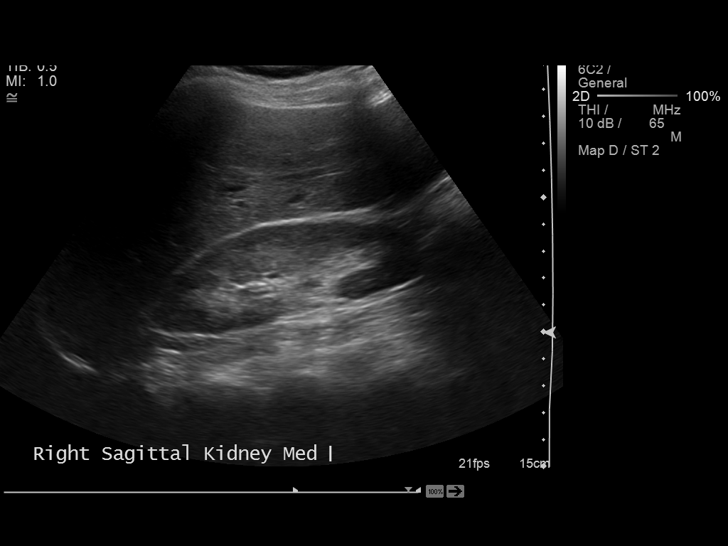
[im 3/27]
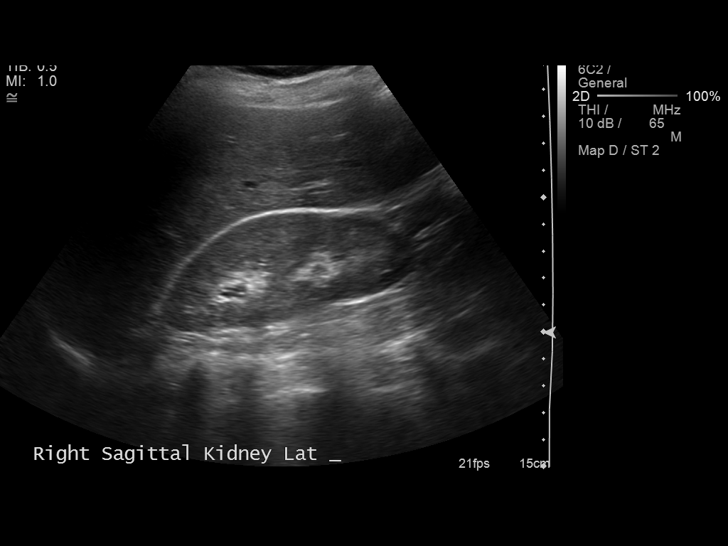
[im 5/27]
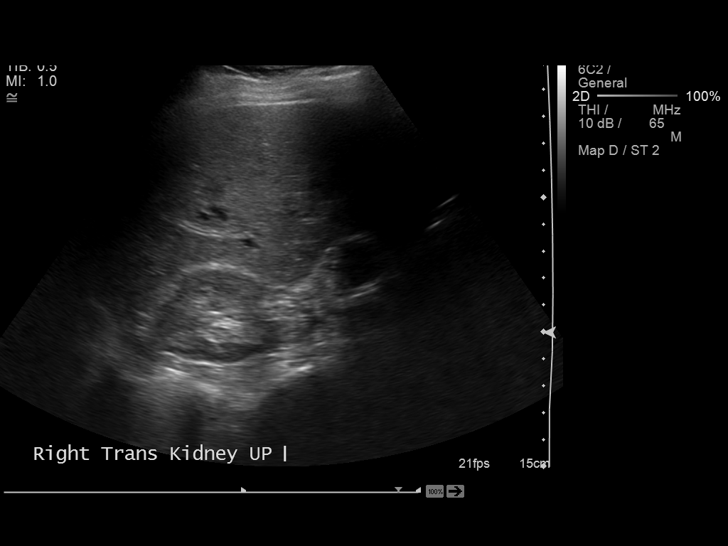
[im 7/27]
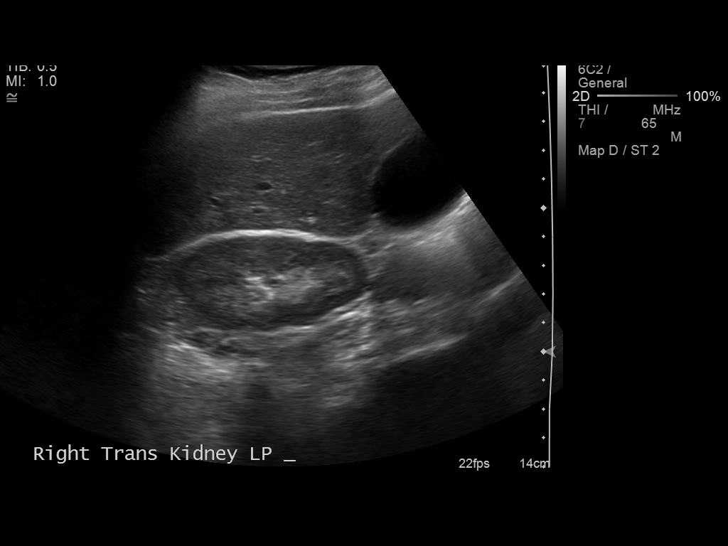
[im 9/27]
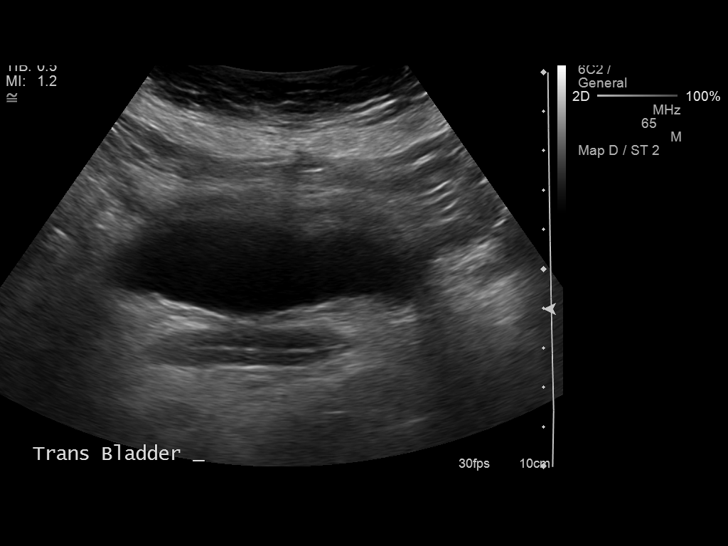
[im 10/27]
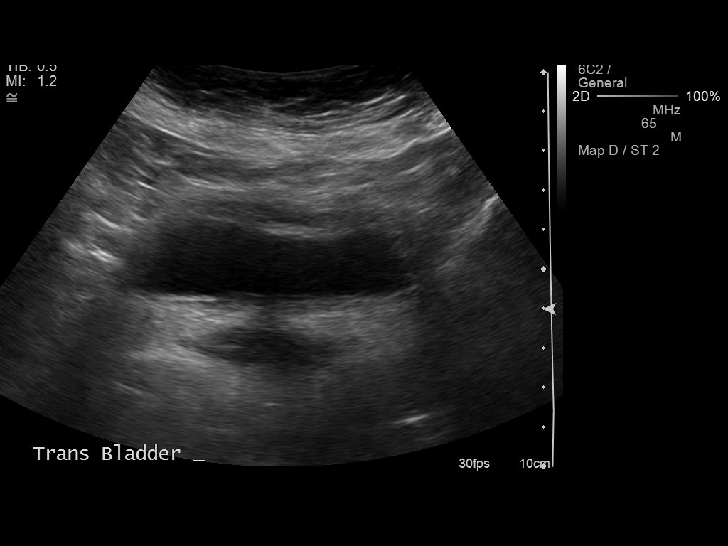
[im 12/27]
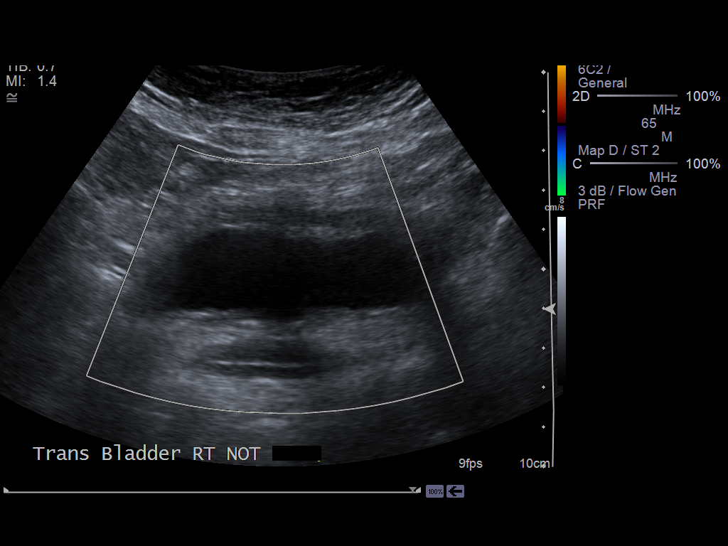
[im 15/27]
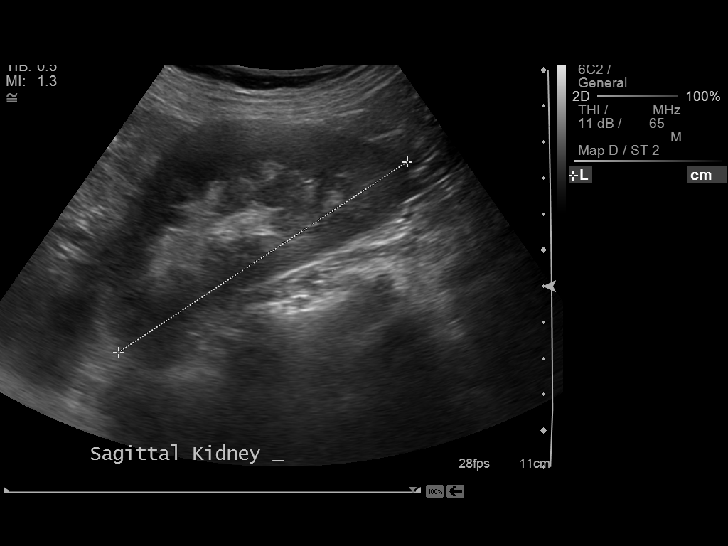
[im 17/27]
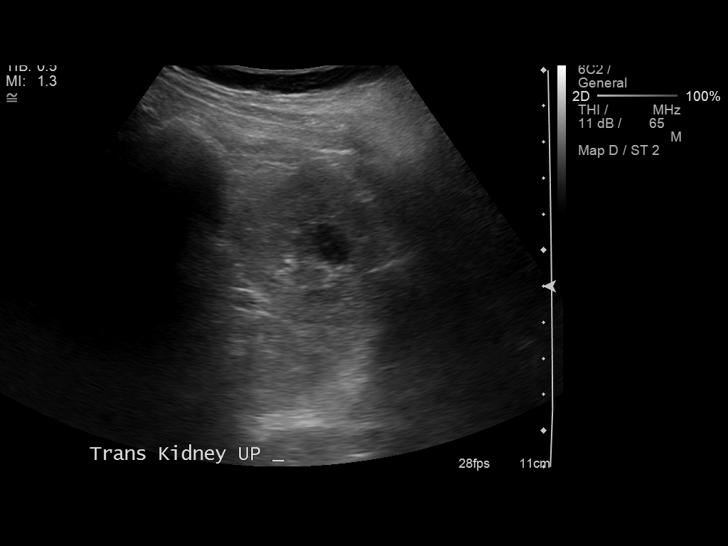
[im 18/27]
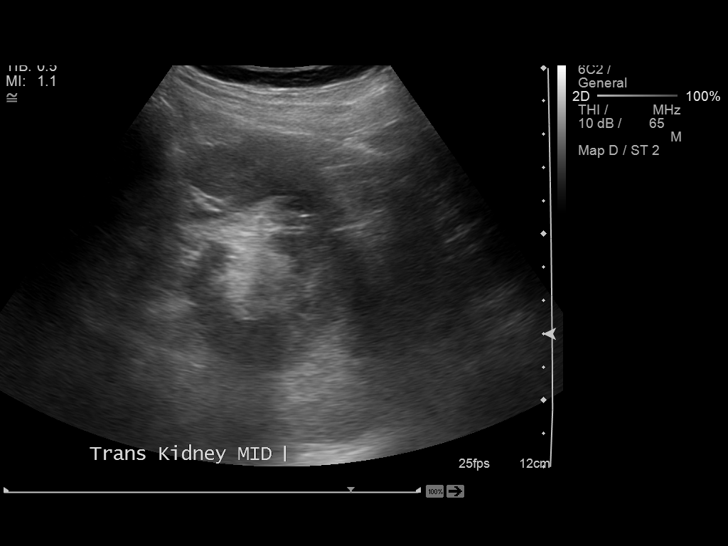
[im 20/27]
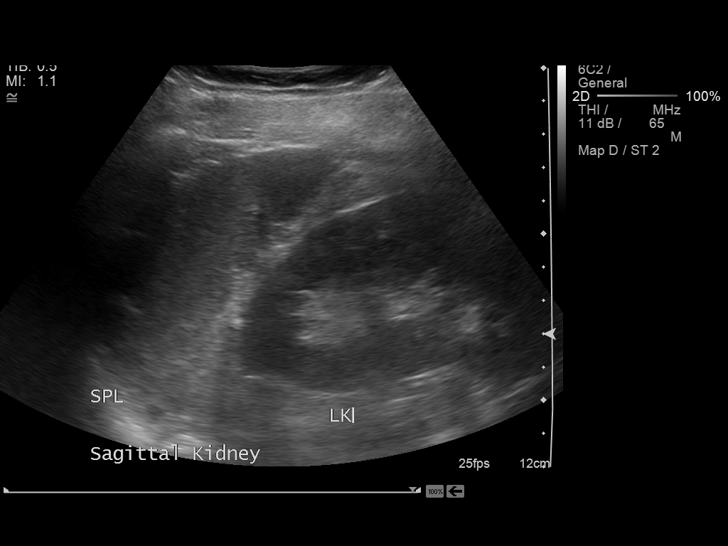
[im 22/27]
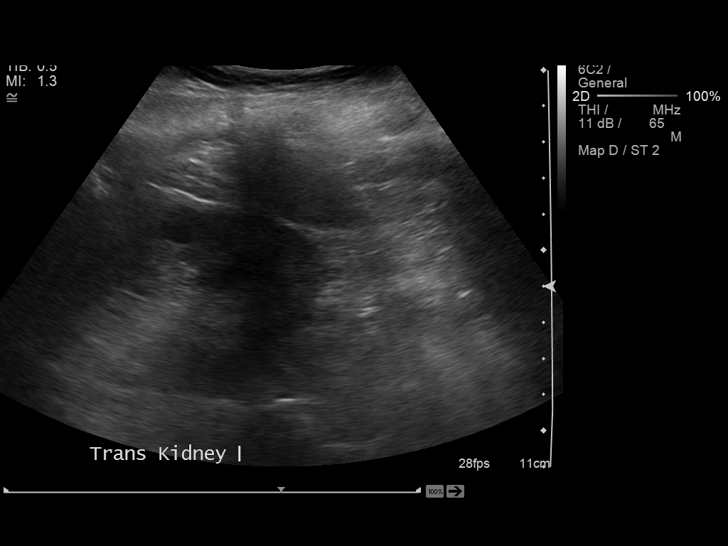
[im 24/27]
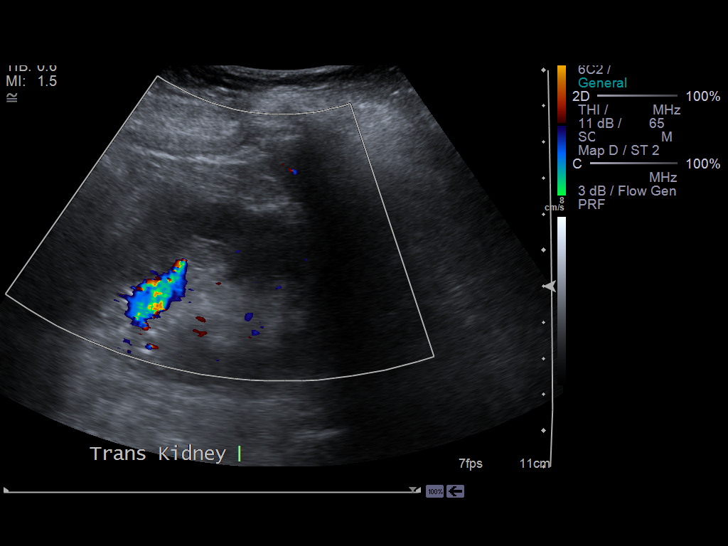
[im 27/27]
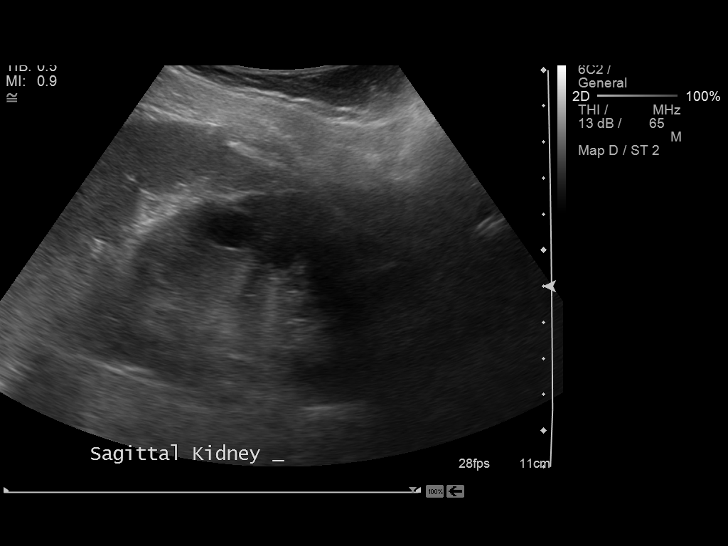

[14 of 25 positions shown; findings below may reference images not displayed]

FINDINGS: Right kidney measures 10.9 cm in length.  No mass,
hydronephrosis or diagnostic renal calculus.

Left kidney measures 9.6 cm in length.  No mass, hydronephrosis or
diagnostic renal calculus.  A mid pole to upper pole cyst measures
1 x 1 cm.

The urinary bladder is under distended.  No bladder filling defects
are identified.
IMPRESSION: 1.  Unremarkable renal ultrasound.  No hydronephrosis or diagnostic
renal calculus.

## 2014-05-06 ENCOUNTER — Inpatient Hospital Stay (HOSPITAL_COMMUNITY)
Admission: EM | Admit: 2014-05-06 | Discharge: 2014-05-08 | DRG: 917 | Disposition: A | Discharge status: Skilled Nursing Facility | Payer: PRIVATE HEALTH INSURANCE | Attending: Family Medicine | Admitting: Family Medicine

## 2014-05-06 ENCOUNTER — Emergency Department (HOSPITAL_COMMUNITY): Payer: PRIVATE HEALTH INSURANCE

## 2014-05-06 ENCOUNTER — Encounter (HOSPITAL_COMMUNITY): Payer: Self-pay | Admitting: Emergency Medicine

## 2014-05-06 DIAGNOSIS — F319 Bipolar disorder, unspecified: Secondary | ICD-10-CM | POA: Diagnosis present

## 2014-05-06 DIAGNOSIS — E039 Hypothyroidism, unspecified: Secondary | ICD-10-CM

## 2014-05-06 DIAGNOSIS — R0902 Hypoxemia: Secondary | ICD-10-CM | POA: Diagnosis present

## 2014-05-06 DIAGNOSIS — M549 Dorsalgia, unspecified: Secondary | ICD-10-CM | POA: Diagnosis present

## 2014-05-06 DIAGNOSIS — J449 Chronic obstructive pulmonary disease, unspecified: Secondary | ICD-10-CM | POA: Diagnosis present

## 2014-05-06 DIAGNOSIS — F609 Personality disorder, unspecified: Secondary | ICD-10-CM | POA: Diagnosis present

## 2014-05-06 DIAGNOSIS — E876 Hypokalemia: Secondary | ICD-10-CM | POA: Diagnosis not present

## 2014-05-06 DIAGNOSIS — Y92009 Unspecified place in unspecified non-institutional (private) residence as the place of occurrence of the external cause: Secondary | ICD-10-CM

## 2014-05-06 DIAGNOSIS — J9811 Atelectasis: Secondary | ICD-10-CM | POA: Diagnosis present

## 2014-05-06 DIAGNOSIS — F322 Major depressive disorder, single episode, severe without psychotic features: Secondary | ICD-10-CM

## 2014-05-06 DIAGNOSIS — T424X1A Poisoning by benzodiazepines, accidental (unintentional), initial encounter: Secondary | ICD-10-CM | POA: Diagnosis present

## 2014-05-06 DIAGNOSIS — Z599 Problem related to housing and economic circumstances, unspecified: Secondary | ICD-10-CM

## 2014-05-06 DIAGNOSIS — F329 Major depressive disorder, single episode, unspecified: Secondary | ICD-10-CM

## 2014-05-06 DIAGNOSIS — G8929 Other chronic pain: Secondary | ICD-10-CM | POA: Diagnosis present

## 2014-05-06 DIAGNOSIS — F341 Dysthymic disorder: Secondary | ICD-10-CM | POA: Diagnosis present

## 2014-05-06 DIAGNOSIS — T50901A Poisoning by unspecified drugs, medicaments and biological substances, accidental (unintentional), initial encounter: Secondary | ICD-10-CM

## 2014-05-06 DIAGNOSIS — E872 Acidosis, unspecified: Secondary | ICD-10-CM

## 2014-05-06 DIAGNOSIS — Z87891 Personal history of nicotine dependence: Secondary | ICD-10-CM

## 2014-05-06 DIAGNOSIS — K219 Gastro-esophageal reflux disease without esophagitis: Secondary | ICD-10-CM

## 2014-05-06 DIAGNOSIS — Z888 Allergy status to other drugs, medicaments and biological substances status: Secondary | ICD-10-CM | POA: Diagnosis not present

## 2014-05-06 DIAGNOSIS — R5381 Other malaise: Secondary | ICD-10-CM

## 2014-05-06 DIAGNOSIS — F1010 Alcohol abuse, uncomplicated: Secondary | ICD-10-CM

## 2014-05-06 DIAGNOSIS — I10 Essential (primary) hypertension: Secondary | ICD-10-CM

## 2014-05-06 DIAGNOSIS — F332 Major depressive disorder, recurrent severe without psychotic features: Secondary | ICD-10-CM | POA: Diagnosis present

## 2014-05-06 DIAGNOSIS — M6282 Rhabdomyolysis: Secondary | ICD-10-CM | POA: Diagnosis present

## 2014-05-06 DIAGNOSIS — M1990 Unspecified osteoarthritis, unspecified site: Secondary | ICD-10-CM | POA: Diagnosis present

## 2014-05-06 DIAGNOSIS — I248 Other forms of acute ischemic heart disease: Secondary | ICD-10-CM | POA: Diagnosis present

## 2014-05-06 DIAGNOSIS — R778 Other specified abnormalities of plasma proteins: Secondary | ICD-10-CM

## 2014-05-06 DIAGNOSIS — J189 Pneumonia, unspecified organism: Secondary | ICD-10-CM

## 2014-05-06 DIAGNOSIS — G4700 Insomnia, unspecified: Secondary | ICD-10-CM | POA: Diagnosis present

## 2014-05-06 DIAGNOSIS — Z720 Tobacco use: Secondary | ICD-10-CM

## 2014-05-06 DIAGNOSIS — D72829 Elevated white blood cell count, unspecified: Secondary | ICD-10-CM

## 2014-05-06 DIAGNOSIS — R7989 Other specified abnormal findings of blood chemistry: Secondary | ICD-10-CM

## 2014-05-06 DIAGNOSIS — T39011A Poisoning by aspirin, accidental (unintentional), initial encounter: Principal | ICD-10-CM | POA: Diagnosis present

## 2014-05-06 DIAGNOSIS — T796XXA Traumatic ischemia of muscle, initial encounter: Secondary | ICD-10-CM

## 2014-05-06 DIAGNOSIS — K5909 Other constipation: Secondary | ICD-10-CM | POA: Diagnosis present

## 2014-05-06 DIAGNOSIS — T50904A Poisoning by unspecified drugs, medicaments and biological substances, undetermined, initial encounter: Secondary | ICD-10-CM

## 2014-05-06 DIAGNOSIS — R4182 Altered mental status, unspecified: Secondary | ICD-10-CM

## 2014-05-06 DIAGNOSIS — J690 Pneumonitis due to inhalation of food and vomit: Secondary | ICD-10-CM | POA: Diagnosis present

## 2014-05-06 HISTORY — DX: Alcohol abuse, uncomplicated: F10.10

## 2014-05-06 HISTORY — DX: Personality disorder, unspecified: F60.9

## 2014-05-06 HISTORY — DX: Tobacco use: Z72.0

## 2014-05-06 HISTORY — DX: Sedative, hypnotic or anxiolytic dependence, uncomplicated: F13.20

## 2014-05-06 HISTORY — DX: Dysthymic disorder: F34.1

## 2014-05-06 LAB — URINE MICROSCOPIC-ADD ON

## 2014-05-06 LAB — URINALYSIS, ROUTINE W REFLEX MICROSCOPIC
Bilirubin Urine: NEGATIVE
GLUCOSE, UA: NEGATIVE mg/dL
Ketones, ur: 40 mg/dL — AB
LEUKOCYTES UA: NEGATIVE
Nitrite: NEGATIVE
PH: 5.5 (ref 5.0–8.0)
Protein, ur: NEGATIVE mg/dL
Specific Gravity, Urine: 1.025 (ref 1.005–1.030)
Urobilinogen, UA: 0.2 mg/dL (ref 0.0–1.0)

## 2014-05-06 LAB — PROTIME-INR
INR: 1.2 (ref 0.00–1.49)
Prothrombin Time: 15.3 seconds — ABNORMAL HIGH (ref 11.6–15.2)

## 2014-05-06 LAB — RAPID URINE DRUG SCREEN, HOSP PERFORMED
AMPHETAMINES: NOT DETECTED
BENZODIAZEPINES: POSITIVE — AB
Barbiturates: NOT DETECTED
Cocaine: NOT DETECTED
OPIATES: NOT DETECTED
TETRAHYDROCANNABINOL: NOT DETECTED

## 2014-05-06 LAB — CBC WITH DIFFERENTIAL/PLATELET
BASOS PCT: 0 % (ref 0–1)
Basophils Absolute: 0 10*3/uL (ref 0.0–0.1)
EOS ABS: 0 10*3/uL (ref 0.0–0.7)
Eosinophils Relative: 0 % (ref 0–5)
HCT: 40.2 % (ref 36.0–46.0)
Hemoglobin: 13.2 g/dL (ref 12.0–15.0)
Lymphocytes Relative: 8 % — ABNORMAL LOW (ref 12–46)
Lymphs Abs: 1.9 10*3/uL (ref 0.7–4.0)
MCH: 31 pg (ref 26.0–34.0)
MCHC: 32.8 g/dL (ref 30.0–36.0)
MCV: 94.4 fL (ref 78.0–100.0)
Monocytes Absolute: 1.5 10*3/uL — ABNORMAL HIGH (ref 0.1–1.0)
Monocytes Relative: 6 % (ref 3–12)
NEUTROS PCT: 86 % — AB (ref 43–77)
Neutro Abs: 20 10*3/uL — ABNORMAL HIGH (ref 1.7–7.7)
PLATELETS: 186 10*3/uL (ref 150–400)
RBC: 4.26 MIL/uL (ref 3.87–5.11)
RDW: 13.9 % (ref 11.5–15.5)
WBC: 23.4 10*3/uL — ABNORMAL HIGH (ref 4.0–10.5)

## 2014-05-06 LAB — BLOOD GAS, ARTERIAL
ACID-BASE DEFICIT: 5 mmol/L — AB (ref 0.0–2.0)
BICARBONATE: 18 meq/L — AB (ref 20.0–24.0)
FIO2: 55 %
O2 Saturation: 96.5 %
PO2 ART: 88.9 mmHg (ref 80.0–100.0)
Patient temperature: 37
TCO2: 15.9 mmol/L (ref 0–100)
pCO2 arterial: 24.9 mmHg — ABNORMAL LOW (ref 35.0–45.0)
pH, Arterial: 7.471 — ABNORMAL HIGH (ref 7.350–7.450)

## 2014-05-06 LAB — ETHANOL: Alcohol, Ethyl (B): <11 mg/dL (ref 0–11)

## 2014-05-06 LAB — COMPREHENSIVE METABOLIC PANEL
ALK PHOS: 94 U/L (ref 39–117)
ALK PHOS: 81 U/L (ref 39–117)
ALT: 28 U/L (ref 0–35)
ALT: 27 U/L (ref 0–35)
ANION GAP: 24 — AB (ref 5–15)
AST: 85 U/L — ABNORMAL HIGH (ref 0–37)
AST: 104 U/L — AB (ref 0–37)
Albumin: 3.8 g/dL (ref 3.5–5.2)
Albumin: 3.2 g/dL — ABNORMAL LOW (ref 3.5–5.2)
Anion gap: 22 — ABNORMAL HIGH (ref 5–15)
BILIRUBIN TOTAL: 0.1 mg/dL — AB (ref 0.3–1.2)
BUN: 23 mg/dL (ref 6–23)
BUN: 21 mg/dL (ref 6–23)
CHLORIDE: 103 meq/L (ref 96–112)
CO2: 20 mEq/L (ref 19–32)
CO2: 21 mEq/L (ref 19–32)
CREATININE: 0.98 mg/dL (ref 0.50–1.10)
Calcium: 8.9 mg/dL (ref 8.4–10.5)
Calcium: 8.2 mg/dL — ABNORMAL LOW (ref 8.4–10.5)
Chloride: 102 mEq/L (ref 96–112)
Creatinine, Ser: 1.03 mg/dL (ref 0.50–1.10)
GFR calc Af Amer: 64 mL/min — ABNORMAL LOW (ref >90)
GFR calc Af Amer: 68 mL/min — ABNORMAL LOW (ref >90)
GFR calc non Af Amer: 55 mL/min — ABNORMAL LOW (ref >90)
GFR calc non Af Amer: 59 mL/min — ABNORMAL LOW (ref >90)
Glucose, Bld: 135 mg/dL — ABNORMAL HIGH (ref 70–99)
Glucose, Bld: 193 mg/dL — ABNORMAL HIGH (ref 70–99)
POTASSIUM: 3.9 meq/L (ref 3.7–5.3)
POTASSIUM: 2.8 meq/L — AB (ref 3.7–5.3)
SODIUM: 146 meq/L (ref 137–147)
Sodium: 146 mEq/L (ref 137–147)
TOTAL PROTEIN: 7.3 g/dL (ref 6.0–8.3)
Total Bilirubin: 0.2 mg/dL — ABNORMAL LOW (ref 0.3–1.2)
Total Protein: 6.5 g/dL (ref 6.0–8.3)

## 2014-05-06 LAB — CK: CK TOTAL: 1549 U/L — AB (ref 7–177)

## 2014-05-06 LAB — TROPONIN I
TROPONIN I: 0.45 ng/mL — AB (ref <0.30)
TROPONIN I: 0.56 ng/mL — AB (ref <0.30)
Troponin I: 0.7 ng/mL (ref <0.30)

## 2014-05-06 LAB — MRSA PCR SCREENING: MRSA by PCR: POSITIVE — AB

## 2014-05-06 LAB — MAGNESIUM: Magnesium: 2.4 mg/dL (ref 1.5–2.5)

## 2014-05-06 LAB — ACETAMINOPHEN LEVEL: Acetaminophen (Tylenol), Serum: <15 ug/mL (ref 10–30)

## 2014-05-06 LAB — SALICYLATE LEVEL
Salicylate Lvl: 22.9 mg/dL — ABNORMAL HIGH (ref 2.8–20.0)
Salicylate Lvl: 21.2 mg/dL — ABNORMAL HIGH (ref 2.8–20.0)

## 2014-05-06 LAB — PHOSPHORUS: PHOSPHORUS: 3 mg/dL (ref 2.3–4.6)

## 2014-05-06 LAB — LACTIC ACID, PLASMA: Lactic Acid, Venous: 2.7 mmol/L — ABNORMAL HIGH (ref 0.5–2.2)

## 2014-05-06 MED ORDER — DEXTROSE 5 % IV SOLN
500.0000 mg | INTRAVENOUS | Status: DC
  Administered 2014-05-06: 500 mg via INTRAVENOUS
  Filled 2014-05-06 (×5): qty 500

## 2014-05-06 MED ORDER — CETYLPYRIDINIUM CHLORIDE 0.05 % MT LIQD
7.0000 mL | Freq: Two times a day (BID) | OROMUCOSAL | Status: DC
  Administered 2014-05-06 – 2014-05-07 (×3): 7 mL via OROMUCOSAL

## 2014-05-06 MED ORDER — NALOXONE HCL 0.4 MG/ML IJ SOLN
0.4000 mg | Freq: Once | INTRAMUSCULAR | Status: AC
  Administered 2014-05-06: 0.4 mg via INTRAVENOUS
  Filled 2014-05-06: qty 1

## 2014-05-06 MED ORDER — VANCOMYCIN HCL 1000 MG IV SOLR
INTRAVENOUS | Status: AC
  Filled 2014-05-06: qty 1000

## 2014-05-06 MED ORDER — SODIUM CHLORIDE 0.9 % IV SOLN
INTRAVENOUS | Status: AC
  Administered 2014-05-06: 100 mL/h via INTRAVENOUS

## 2014-05-06 MED ORDER — GABAPENTIN 300 MG PO CAPS
300.0000 mg | ORAL_CAPSULE | Freq: Three times a day (TID) | ORAL | Status: DC
  Administered 2014-05-07 – 2014-05-08 (×4): 300 mg via ORAL
  Filled 2014-05-06 (×4): qty 1

## 2014-05-06 MED ORDER — ONDANSETRON HCL 4 MG/2ML IJ SOLN
4.0000 mg | Freq: Four times a day (QID) | INTRAMUSCULAR | Status: DC | PRN
Start: 2014-05-06 — End: 2014-05-08

## 2014-05-06 MED ORDER — CHLORHEXIDINE GLUCONATE 0.12 % MT SOLN
15.0000 mL | Freq: Two times a day (BID) | OROMUCOSAL | Status: DC
  Administered 2014-05-06 – 2014-05-08 (×3): 15 mL via OROMUCOSAL
  Filled 2014-05-06 (×3): qty 15

## 2014-05-06 MED ORDER — PRAVASTATIN SODIUM 40 MG PO TABS
40.0000 mg | ORAL_TABLET | Freq: Every day | ORAL | Status: DC
  Administered 2014-05-07: 40 mg via ORAL
  Filled 2014-05-06: qty 1

## 2014-05-06 MED ORDER — ONDANSETRON HCL 4 MG PO TABS
4.0000 mg | ORAL_TABLET | Freq: Four times a day (QID) | ORAL | Status: DC | PRN
Start: 2014-05-06 — End: 2014-05-08

## 2014-05-06 MED ORDER — AMLODIPINE BESYLATE 5 MG PO TABS
5.0000 mg | ORAL_TABLET | Freq: Every day | ORAL | Status: DC
  Administered 2014-05-07 – 2014-05-08 (×2): 5 mg via ORAL
  Filled 2014-05-06 (×2): qty 1

## 2014-05-06 MED ORDER — SODIUM CHLORIDE 0.9 % IV SOLN
INTRAVENOUS | Status: DC
  Administered 2014-05-06: 19:00:00 via INTRAVENOUS
  Administered 2014-05-06: 1000 mL via INTRAVENOUS
  Administered 2014-05-07: 13:00:00 via INTRAVENOUS

## 2014-05-06 MED ORDER — METOPROLOL TARTRATE 25 MG PO TABS: 37.5000 mg | ORAL_TABLET | Freq: Two times a day (BID) | ORAL | Status: DC

## 2014-05-06 MED ORDER — SODIUM CHLORIDE 0.9 % IV BOLUS (SEPSIS): 1000.0000 mL | Freq: Once | INTRAVENOUS | Status: DC

## 2014-05-06 MED ORDER — MUPIROCIN 2 % EX OINT
1.0000 "application " | TOPICAL_OINTMENT | Freq: Two times a day (BID) | CUTANEOUS | Status: DC
  Administered 2014-05-06 – 2014-05-08 (×3): 1 via NASAL
  Filled 2014-05-06: qty 22

## 2014-05-06 MED ORDER — SODIUM CHLORIDE 0.9 % IV BOLUS (SEPSIS)
500.0000 mL | Freq: Once | INTRAVENOUS | Status: AC
  Administered 2014-05-06: 500 mL via INTRAVENOUS

## 2014-05-06 MED ORDER — PIPERACILLIN-TAZOBACTAM 3.375 G IVPB
INTRAVENOUS | Status: AC
  Filled 2014-05-06: qty 50

## 2014-05-06 MED ORDER — ALBUTEROL SULFATE (2.5 MG/3ML) 0.083% IN NEBU
2.5000 mg | INHALATION_SOLUTION | Freq: Once | RESPIRATORY_TRACT | Status: AC
  Administered 2014-05-06: 2.5 mg via RESPIRATORY_TRACT
  Filled 2014-05-06: qty 3

## 2014-05-06 MED ORDER — SODIUM CHLORIDE 0.9 % IV SOLN
INTRAVENOUS | Status: DC
  Administered 2014-05-06: 15:00:00 via INTRAVENOUS

## 2014-05-06 MED ORDER — VANCOMYCIN HCL IN DEXTROSE 1-5 GM/200ML-% IV SOLN
INTRAVENOUS | Status: AC
  Filled 2014-05-06: qty 200

## 2014-05-06 MED ORDER — TOPIRAMATE 100 MG PO TABS
100.0000 mg | ORAL_TABLET | Freq: Two times a day (BID) | ORAL | Status: DC
  Administered 2014-05-07 – 2014-05-08 (×3): 100 mg via ORAL
  Filled 2014-05-06 (×10): qty 1

## 2014-05-06 MED ORDER — DULOXETINE HCL 60 MG PO CPEP
60.0000 mg | ORAL_CAPSULE | Freq: Every day | ORAL | Status: DC
  Administered 2014-05-07 – 2014-05-08 (×2): 60 mg via ORAL
  Filled 2014-05-06 (×2): qty 1

## 2014-05-06 MED ORDER — VANCOMYCIN HCL IN DEXTROSE 1-5 GM/200ML-% IV SOLN
1000.0000 mg | Freq: Once | INTRAVENOUS | Status: AC
  Administered 2014-05-06: 1000 mg via INTRAVENOUS
  Filled 2014-05-06: qty 200

## 2014-05-06 MED ORDER — LEVOTHYROXINE SODIUM 50 MCG PO TABS
50.0000 ug | ORAL_TABLET | Freq: Every day | ORAL | Status: DC
  Administered 2014-05-07 – 2014-05-08 (×2): 50 ug via ORAL
  Filled 2014-05-06: qty 2
  Filled 2014-05-06: qty 1

## 2014-05-06 MED ORDER — LORAZEPAM 2 MG/ML IJ SOLN
2.0000 mg | INTRAMUSCULAR | Status: DC | PRN
  Administered 2014-05-07: 2 mg via INTRAVENOUS
  Filled 2014-05-06: qty 1

## 2014-05-06 MED ORDER — MIRTAZAPINE 30 MG PO TABS
15.0000 mg | ORAL_TABLET | Freq: Every day | ORAL | Status: DC
  Administered 2014-05-07: 15 mg via ORAL
  Filled 2014-05-06: qty 1

## 2014-05-06 MED ORDER — SODIUM CHLORIDE 0.9 % IJ SOLN
3.0000 mL | Freq: Two times a day (BID) | INTRAMUSCULAR | Status: DC
  Administered 2014-05-06 – 2014-05-07 (×2): 3 mL via INTRAVENOUS

## 2014-05-06 MED ORDER — CEFTRIAXONE SODIUM IN DEXTROSE 20 MG/ML IV SOLN: 1.0000 g | INTRAVENOUS | Status: DC

## 2014-05-06 MED ORDER — PIPERACILLIN-TAZOBACTAM 3.375 G IVPB
3.3750 g | Freq: Three times a day (TID) | INTRAVENOUS | Status: DC
  Administered 2014-05-07 (×3): 3.375 g via INTRAVENOUS
  Filled 2014-05-06 (×14): qty 50

## 2014-05-06 MED ORDER — AZITHROMYCIN 500 MG IV SOLR
INTRAVENOUS | Status: AC
  Filled 2014-05-06: qty 500

## 2014-05-06 MED ORDER — POLYETHYLENE GLYCOL 3350 17 G PO PACK: 17.0000 g | PACK | Freq: Every day | ORAL | Status: DC | PRN

## 2014-05-06 MED ORDER — HEPARIN SODIUM (PORCINE) 5000 UNIT/ML IJ SOLN
5000.0000 [IU] | Freq: Three times a day (TID) | INTRAMUSCULAR | Status: DC
  Administered 2014-05-06 – 2014-05-08 (×6): 5000 [IU] via SUBCUTANEOUS
  Filled 2014-05-06 (×6): qty 1

## 2014-05-06 MED ORDER — LIDOCAINE 5 % EX PTCH
1.0000 | MEDICATED_PATCH | Freq: Every day | CUTANEOUS | Status: DC | PRN
  Filled 2014-05-06: qty 1

## 2014-05-06 MED ORDER — VANCOMYCIN HCL 500 MG IV SOLR
500.0000 mg | Freq: Two times a day (BID) | INTRAVENOUS | Status: DC
  Administered 2014-05-07: 500 mg via INTRAVENOUS
  Filled 2014-05-06 (×9): qty 500

## 2014-05-06 MED ORDER — ALBUTEROL SULFATE (2.5 MG/3ML) 0.083% IN NEBU
2.5000 mg | INHALATION_SOLUTION | RESPIRATORY_TRACT | Status: DC
  Administered 2014-05-06 (×2): 2.5 mg via RESPIRATORY_TRACT
  Filled 2014-05-06 (×2): qty 3

## 2014-05-06 MED ORDER — METOPROLOL TARTRATE 1 MG/ML IV SOLN
5.0000 mg | Freq: Four times a day (QID) | INTRAVENOUS | Status: DC
  Administered 2014-05-06 – 2014-05-07 (×4): 5 mg via INTRAVENOUS
  Filled 2014-05-06 (×5): qty 5

## 2014-05-06 MED ORDER — SODIUM BICARBONATE 8.4 % IV SOLN
1.0000 meq/kg | Freq: Once | INTRAVENOUS | Status: AC
  Administered 2014-05-06: 64.8 meq via INTRAVENOUS

## 2014-05-06 MED ORDER — SENNA 8.6 MG PO TABS
1.0000 | ORAL_TABLET | Freq: Two times a day (BID) | ORAL | Status: DC
  Administered 2014-05-07 – 2014-05-08 (×2): 8.6 mg via ORAL
  Filled 2014-05-06 (×2): qty 1

## 2014-05-06 MED ORDER — PANTOPRAZOLE SODIUM 40 MG PO TBEC
40.0000 mg | DELAYED_RELEASE_TABLET | Freq: Every day | ORAL | Status: DC
  Administered 2014-05-07 – 2014-05-08 (×2): 40 mg via ORAL
  Filled 2014-05-06 (×2): qty 1

## 2014-05-06 MED ORDER — CHLORHEXIDINE GLUCONATE CLOTH 2 % EX PADS
6.0000 | MEDICATED_PAD | Freq: Every day | CUTANEOUS | Status: DC
  Administered 2014-05-07 – 2014-05-08 (×2): 6 via TOPICAL

## 2014-05-06 MED ORDER — PIPERACILLIN-TAZOBACTAM 3.375 G IVPB
3.3750 g | Freq: Once | INTRAVENOUS | Status: AC
  Administered 2014-05-06: 3.375 g via INTRAVENOUS
  Filled 2014-05-06: qty 50

## 2014-05-06 NOTE — ED Notes (Signed)
Critical Trop reported to Dr. Thurnell Garbe. Trop 0.45.

## 2014-05-06 NOTE — H&P (Addendum)
[facility] History and Physical  EVALEE GERARD VOJ:[id number] DOB: 1945/06/03 DOA: 05/06/2014  Referring physician: Dr Thurnell Garbe - [facility] PCP: Maricela Curet, MD   Chief Complaint: found down  HPI: ROBINA Hall is a 68 y.o. female  Pt found down this morning by home nursing aid surounded by empty medication bottles, empty alcohol bottles, confused, and covered in feces and urine. EMS was called for emergency transport. Pt was last seen in her normal state the prior evening. Pt  Unable to give history at the time of presentation but POC, Leann ANgel, was contacted to discuss further. She states that the pt was recently started on a Zpack for respiratory infection. She has a h/o ETOH abuse and was using ETOH to control back pain. She falls frequently and has been becoming progressively weaker over the past few weeks.    Review of Systems:  Pt unable to give due to mental state.    Past Medical History  Diagnosis Date  . Arthritis   . Chronic back pain     Dr Merlene Laughter  . Chronic constipation   . S/P colonoscopy 12/28/05    Dr Catalina Antigua prep, lipoma left colon  . Sinus drainage   . Alcohol abuse, in remission   . COPD (chronic obstructive pulmonary disease)   . Sedative, hypnotic or anxiolytic dependence   . Neurotic depression   . Personality disorder    Past Surgical History  Procedure Laterality Date  . Complete hysterectomy    . Appendectomy     Social History:  reports that she has quit smoking. Her smoking use included Cigarettes. She has a 72 pack-year smoking history. She does not have any smokeless tobacco history on file. She reports that she drinks alcohol. She reports that she does not use illicit drugs.  Allergies  Allergen Reactions  . Pregabalin     REACTION: "couldn't move"    Family History  Problem Relation Age of Onset  . Family history unknown: Yes     Prior to Admission medications   Medication Sig Start Date End Date Taking?  Authorizing Provider  Ascorbic Acid (VITAMIN C) 1000 MG tablet Take 1,000 mg by mouth daily.     Yes Historical Provider, MD  B Complex-C (SUPER B COMPLEX PO) Take 1 tablet by mouth daily.   Yes Historical Provider, MD  Calcium Carbonate-Vitamin D (OYSTER SHELL CALCIUM 500 + D) 500-125 MG-UNIT TABS Take 1 tablet by mouth daily.    Yes Historical Provider, MD  Cholecalciferol (VITAMIN D) 1000 UNITS capsule Take 2,000 Units by mouth daily.    Yes Historical Provider, MD  cimetidine (TAGAMET) 400 MG tablet Take 400 mg by mouth 2 (two) times daily. 04/08/14  Yes Historical Provider, MD  ferrous gluconate (FERGON) 325 MG tablet Take 325 mg by mouth daily with breakfast.     Yes Historical Provider, MD  fish oil-omega-3 fatty acids 1000 MG capsule Take 2 g by mouth daily.     Yes Historical Provider, MD  levothyroxine (SYNTHROID, LEVOTHROID) 50 MCG tablet Take 50 mcg by mouth daily. 04/08/14  Yes Historical Provider, MD  metoprolol tartrate (LOPRESSOR) 25 MG tablet Take 37.5 mg by mouth 2 (two) times daily.  11/16/10  Yes Historical Provider, MD  Multiple Vitamin (MULTIVITAMIN) capsule Take 1 capsule by mouth daily.     Yes Historical Provider, MD  oxyCODONE-acetaminophen (PERCOCET) 10-325 MG per tablet Take 1 tablet by mouth 4 (four) times daily as needed. 04/14/14  Yes Historical Provider, MD  polyethylene glycol powder (GLYCOLAX/MIRALAX) powder Take 17 g by mouth daily. 04/08/14  Yes Historical Provider, MD  pravastatin (PRAVACHOL) 40 MG tablet Take 40 mg by mouth at bedtime. 04/08/14  Yes Historical Provider, MD  QC LORATADINE ALLERGY RELIEF 10 MG tablet Take 10 mg by mouth daily. 04/08/14  Yes Historical Provider, MD  traZODone (DESYREL) 150 MG tablet Take 300 mg by mouth 2 (two) times daily. 03/18/14  Yes Historical Provider, MD  vitamin A 10000 UNIT capsule Take 10,000 Units by mouth daily.   Yes Historical Provider, MD  vitamin E 400 UNIT capsule Take 400 Units by mouth daily.     Yes Historical  Provider, MD  albuterol (PROVENTIL HFA;VENTOLIN HFA) 108 (90 BASE) MCG/ACT inhaler Inhale 2 puffs into the lungs every 6 (six) hours as needed. For shortness of breath    Historical Provider, MD  albuterol (PROVENTIL) (2.5 MG/3ML) 0.083% nebulizer solution Take 2.5 mg by nebulization every 6 (six) hours as needed.      Historical Provider, MD  ALPRAZolam Duanne Moron) 1 MG tablet Take 1 mg by mouth 4 (four) times daily.    Historical Provider, MD  amLODipine (NORVASC) 5 MG tablet Take 5 mg by mouth daily.      Historical Provider, MD  celecoxib (CELEBREX) 200 MG capsule Take 200 mg by mouth 2 (two) times daily.      Historical Provider, MD  DULoxetine (CYMBALTA) 60 MG capsule Take 60 mg by mouth daily.      Historical Provider, MD  gabapentin (NEURONTIN) 300 MG capsule Take 300 mg by mouth Daily. 11/04/10   Historical Provider, MD  lidocaine (LIDODERM) 5 % Place 1 patch onto the skin daily. Remove & Discard patch within 12 hours or as directed by MD     Historical Provider, MD  mirtazapine (REMERON) 15 MG tablet Take 15 mg by mouth at bedtime.      Historical Provider, MD  pantoprazole (PROTONIX) 40 MG tablet Take 40 mg by mouth daily.      Historical Provider, MD  Potassium 99 MG TABS Take by mouth.      Historical Provider, MD  topiramate (TOPAMAX) 100 MG tablet Take 100 mg by mouth 2 (two) times daily.      Historical Provider, MD  traZODone (DESYREL) 100 MG tablet Take 100 mg by mouth at bedtime.      Historical Provider, MD   Physical Exam: Filed Vitals:   05/06/14 1530 05/06/14 1545 05/06/14 1600 05/06/14 1722  BP: 151/62 161/69 160/72   Pulse: 103 112 108   Temp:    99.7 F (37.6 C)  TempSrc:      Resp: 17 25 22    Height:      Weight:      SpO2: 94% 94% 94%     Wt Readings from Last 3 Encounters:  05/06/14 56.7 kg (125 lb)  07/03/12 56.7 kg (125 lb)  05/27/11 75.297 kg (166 lb)    General: In distress, sleeping Eyes:  PERRL, normal lids, irises & conjunctiva ENT: very dry mm,    Neck:  no LAD, masses or thyromegaly Cardiovascular:  RRR, no m/r/g. Trace LE edema Telemetry:  SR, no arrhythmias  Respiratory: course breath sounds in both lung bases w/ increased respiratory effort.  Abdomen:  soft, ntnd Skin: SKin abrasions on L hand, no rash seen on limited exam Musculoskeletal: Limited exam due to mental state. Moves all extremities in coordinated fashion Psychiatric: Pt wakes and tries to answer questions but is unable to  stay awake for very long Neurologic: Pt altered. Will follow commands while awake. Moves all 4 extremities.           Labs on Admission:  Basic Metabolic Panel:  Recent Labs Lab 05/06/14 1302  NA 146  K 3.9  CL 102  CO2 20  GLUCOSE 135*  BUN 23  CREATININE 1.03  CALCIUM 8.9   Liver Function Tests:  Recent Labs Lab 05/06/14 1302  AST 85*  ALT 28  ALKPHOS 94  BILITOT 0.2*  PROT 7.3  ALBUMIN 3.8   No results for input(s): LIPASE, AMYLASE in the last 168 hours. No results for input(s): AMMONIA in the last 168 hours. CBC:  Recent Labs Lab 05/06/14 1302  WBC 23.4*  NEUTROABS 20.0*  HGB 13.2  HCT 40.2  MCV 94.4  PLT 186   Cardiac Enzymes:  Recent Labs Lab 05/06/14 1302 05/06/14 1532  CKTOTAL 1549*  --   TROPONINI 0.45* 0.56*    BNP (last 3 results) No results for input(s): PROBNP in the last 8760 hours. CBG: No results for input(s): GLUCAP in the last 168 hours.  Radiological Exams on Admission: Ct Head Wo Contrast  05/06/2014   CLINICAL DATA:  Initial encounter for loss of consciousness this morning.  EXAM: CT HEAD WITHOUT CONTRAST  TECHNIQUE: Contiguous axial images were obtained from the base of the skull through the vertex without intravenous contrast.  COMPARISON:  09/16/2013.  FINDINGS: There is no evidence for acute hemorrhage, hydrocephalus, mass lesion, or abnormal extra-axial fluid collection. No definite CT evidence for acute infarction. Diffuse loss of parenchymal volume is consistent with atrophy.  The visualized paranasal sinuses and mastoid air cells are clear.  IMPRESSION: No acute intracranial abnormality. Mild generalized atrophy is unchanged.   Electronically Signed   By: Misty Stanley M.D.   On: 05/06/2014 14:56   Dg Chest Port 1 View  05/06/2014   CLINICAL DATA:  Altered mental status.  EXAM: PORTABLE CHEST - 1 VIEW  COMPARISON:  09/16/2013  FINDINGS: New opacity at the right lung base could reflect atelectasis or infiltrate. Left lung is clear. Heart is normal size. No effusions or acute bony abnormality.  IMPRESSION: Right basilar atelectasis or infiltrate/pneumonia.   Electronically Signed   By: Rolm Baptise M.D.   On: 05/06/2014 13:55    EKG:  Ordered @ 17:12  Assessment/Plan Principal Problem:   Altered mental state Active Problems:   Essential hypertension   COPD (chronic obstructive pulmonary disease)   Tobacco abuse   ETOH abuse   Drug overdose   Elevated troponin   CAP (community acquired pneumonia)   Lactic acidosis   Leukocytosis   Severe depression   Insomnia   Hypothyroidism   GERD (gastroesophageal reflux disease)   Rhabdomyolysis   Physical deconditioning   AMS: likely secondary to medication overdose adn ETOH use. Improvement w/ Narcan in ED. Likely compounded by respiratory decompensation. H/o ETOH abuse, and found w/ several bottles of medications. Unknown which medications taken. UDS showing benzos, ETOH level nml. Aspirin level slightly elevated to 22. CT head w/o acute process noted.   - admit to step down - tele - Repeat Aspirin level - Hold home xanax, oxycodone, trazodone  Metabolic acidosis: Anion gap 24. Likely secondary to lactic acid from Rhabdomyolisis, and Salicylate poisoning. 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  - Management as above  Acute Respiratory  failure: Likely secondary to chronic COPD w/ superimposed CAP  (ICU) and ASA poisoning. ABG pH 7.4, CO2 24.9, O2 88. Low CO2 likely compensation for metabolic acidosis. CXR w/ B basilar process noted - concern for aspiration. - O2 -  Zos, Azithro, Vanc - narrow as improves  Depression/Psych: concern for suicide attempt - Sitter in room - continue remeron and cymbalta in am - consider psych consult  Chronic pain: pt overly sedated on admission - continue neurontin in the am - resume lidoderm patch  prn  ETOH abuse: level <11 on admission.  - CIWA  HTN: stable on admission - continue home metoprolol  Chronic HA/Migraines:  - continue topomax in the am  Elevated troponin: likely demand ischemia. Trending up since admission - 0.45, 0.57. Discussed w/ cardiology who recommends monitoring. EKG w/o ischemia - Trend troponins - Cards consult in am if continues  Code Status: FULL DVT Prophylaxis: Hep Family Communication: Spoke to [employer] by phone (only family POC for Pt) Disposition Plan: pending improvement  Time spent: >70 min in direct pt care and coordination of critically ill pt.   [place], [place] [facility] [email or web address] Password [email or web address]

## 2014-05-06 NOTE — ED Notes (Signed)
Pt brought in via EMS. Reported unconscious by caregiver this am. Pt was found with mult bottles of alcohol around her, pills around her, under her and white residue around her mouth. Pt arouses to sternal rub. Pupils dilated but equal. Pt covered in feces and urine. Pt combative when aroused. Pt placed on nonrebreather, sats on RA in low 80s. ST on monitor. Pt resp rate 19.

## 2014-05-06 NOTE — Progress Notes (Addendum)
[facility] for Zosyn & Vancomycin Indication: pneumonia  Allergies  Allergen Reactions  . Pregabalin     REACTION: "couldn't move"    Patient Measurements: Height: 5\' 5"  (165.1 cm) Weight: 125 lb (56.7 kg) IBW/kg (Calculated) : 57  Vital Signs: Temp: 99.7 F (37.6 C) (12/09 1208) Temp Source: Rectal (12/09 1208) BP: 160/72 mmHg (12/09 1600) Pulse Rate: 108 (12/09 1600) Intake/Output from previous day:   Intake/Output from this shift:    Labs:  Recent Labs  05/06/14 1302  WBC 23.4*  HGB 13.2  PLT 186  CREATININE 1.03   Estimated Creatinine Clearance: 48.1 mL/min (by C-G formula based on Cr of 1.03). No results for input(s): VANCOTROUGH, VANCOPEAK, VANCORANDOM, GENTTROUGH, GENTPEAK, GENTRANDOM, TOBRATROUGH, TOBRAPEAK, TOBRARND, AMIKACINPEAK, AMIKACINTROU, AMIKACIN in the last 72 hours.   Microbiology: No results found for this or any previous visit (from the past 720 hour(s)).  Anti-infectives    Start     Dose/Rate Route Frequency Ordered Stop   05/06/14 1545  piperacillin-tazobactam (ZOSYN) IVPB 3.375 g     3.375 g12.5 mL/hr over 240 Minutes Intravenous  Once 05/06/14 1533 05/06/14 1623      Assessment: 68 yo F who was found unconscious.  CXR + PNA.   She is afebrile, but WBC and lactic acid levels are elevated.   Concern for aspiration.  Renal function is at patient's baseline.  Zosyn 12/9>>  Goal of Therapy:  Eradicate infection.  Plan:  Zosyn 3.375gm IV Q8h to be infused over 4hrs Monitor renal function and cx data   Biagio Borg 05/06/2014,4:53 PM  Vancomycin protocol added for pneumonia Vancomycin 1 GM IV loading dose, then 500 mg IV every 12 hours Vancomycin trough at steady state Labs per protocol  Maeola Sarah [facility] 05/06/2014   1830

## 2014-05-06 NOTE — ED Provider Notes (Signed)
CSN: [id number]     Arrival date & time 05/06/14  1211 History   First MD Initiated Contact with Patient 05/06/14 1319     Chief Complaint  Patient presents with  . Altered Mental Status     Patient is a 68 y.o. female presenting with altered mental status. The history is provided by the EMS personnel. The history is limited by the condition of the patient (AMS).  Altered Mental Status Pt was seen at 1335. Per EMS and caregiver report: Caregiver told EMS she found pt this morning with AMS, covered in feces and urine. Stated there were "multiple pills, pill bottles and alcohol bottles all around her." Pt was combative when EMS attempted to arouse her, VS were stable during transport.    Past Medical History  Diagnosis Date  . Arthritis   . Chronic back pain     Dr Merlene Laughter  . Chronic constipation   . S/P colonoscopy 12/28/05    Dr Catalina Antigua prep, lipoma left colon  . Sinus drainage   . Alcohol abuse, in remission   . COPD (chronic obstructive pulmonary disease)   . Sedative, hypnotic or anxiolytic dependence   . Neurotic depression   . Personality disorder    Past Surgical History  Procedure Laterality Date  . Complete hysterectomy    . Appendectomy     Family History  Problem Relation Age of Onset  . Family history unknown: Yes   History  Substance Use Topics  . Smoking status: Former Smoker -- 1.50 packs/day for 48 years    Types: Cigarettes  . Smokeless tobacco: Not on file     Comment: smoke the fake cigarettes now  . Alcohol Use: Yes     Comment: heavy etoh x71yrs, quit 2004    Review of Systems  Unable to perform ROS: Mental status change      Allergies  Pregabalin  Home Medications   Prior to Admission medications   Medication Sig Start Date End Date Taking? Authorizing Provider  Ascorbic Acid (VITAMIN C) 1000 MG tablet Take 1,000 mg by mouth daily.     Yes Historical Provider, MD  B Complex-C (SUPER B COMPLEX PO) Take 1 tablet by mouth daily.   Yes  Historical Provider, MD  Calcium Carbonate-Vitamin D (OYSTER SHELL CALCIUM 500 + D) 500-125 MG-UNIT TABS Take 1 tablet by mouth daily.    Yes Historical Provider, MD  Cholecalciferol (VITAMIN D) 1000 UNITS capsule Take 2,000 Units by mouth daily.    Yes Historical Provider, MD  cimetidine (TAGAMET) 400 MG tablet Take 400 mg by mouth 2 (two) times daily. 04/08/14  Yes Historical Provider, MD  ferrous gluconate (FERGON) 325 MG tablet Take 325 mg by mouth daily with breakfast.     Yes Historical Provider, MD  fish oil-omega-3 fatty acids 1000 MG capsule Take 2 g by mouth daily.     Yes Historical Provider, MD  levothyroxine (SYNTHROID, LEVOTHROID) 50 MCG tablet Take 50 mcg by mouth daily. 04/08/14  Yes Historical Provider, MD  metoprolol tartrate (LOPRESSOR) 25 MG tablet Take 37.5 mg by mouth 2 (two) times daily.  11/16/10  Yes Historical Provider, MD  Multiple Vitamin (MULTIVITAMIN) capsule Take 1 capsule by mouth daily.     Yes Historical Provider, MD  oxyCODONE-acetaminophen (PERCOCET) 10-325 MG per tablet Take 1 tablet by mouth 4 (four) times daily as needed. 04/14/14  Yes Historical Provider, MD  polyethylene glycol powder (GLYCOLAX/MIRALAX) powder Take 17 g by mouth daily. 04/08/14  Yes Historical Provider,  MD  pravastatin (PRAVACHOL) 40 MG tablet Take 40 mg by mouth at bedtime. 04/08/14  Yes Historical Provider, MD  QC LORATADINE ALLERGY RELIEF 10 MG tablet Take 10 mg by mouth daily. 04/08/14  Yes Historical Provider, MD  traZODone (DESYREL) 150 MG tablet Take 300 mg by mouth 2 (two) times daily. 03/18/14  Yes Historical Provider, MD  vitamin A 10000 UNIT capsule Take 10,000 Units by mouth daily.   Yes Historical Provider, MD  vitamin E 400 UNIT capsule Take 400 Units by mouth daily.     Yes Historical Provider, MD  albuterol (PROVENTIL HFA;VENTOLIN HFA) 108 (90 BASE) MCG/ACT inhaler Inhale 2 puffs into the lungs every 6 (six) hours as needed. For shortness of breath    Historical Provider, MD   albuterol (PROVENTIL) (2.5 MG/3ML) 0.083% nebulizer solution Take 2.5 mg by nebulization every 6 (six) hours as needed.      Historical Provider, MD  ALPRAZolam Duanne Moron) 1 MG tablet Take 1 mg by mouth 4 (four) times daily.    Historical Provider, MD  amLODipine (NORVASC) 5 MG tablet Take 5 mg by mouth daily.      Historical Provider, MD  celecoxib (CELEBREX) 200 MG capsule Take 200 mg by mouth 2 (two) times daily.      Historical Provider, MD  DULoxetine (CYMBALTA) 60 MG capsule Take 60 mg by mouth daily.      Historical Provider, MD  gabapentin (NEURONTIN) 300 MG capsule Take 300 mg by mouth Daily. 11/04/10   Historical Provider, MD  lidocaine (LIDODERM) 5 % Place 1 patch onto the skin daily. Remove & Discard patch within 12 hours or as directed by MD     Historical Provider, MD  mirtazapine (REMERON) 15 MG tablet Take 15 mg by mouth at bedtime.      Historical Provider, MD  pantoprazole (PROTONIX) 40 MG tablet Take 40 mg by mouth daily.      Historical Provider, MD  Potassium 99 MG TABS Take by mouth.      Historical Provider, MD  topiramate (TOPAMAX) 100 MG tablet Take 100 mg by mouth 2 (two) times daily.      Historical Provider, MD  traZODone (DESYREL) 100 MG tablet Take 100 mg by mouth at bedtime.      Historical Provider, MD   BP 151/68 mmHg  Pulse 111  Temp(Src) 99.7 F (37.6 C) (Rectal)  Resp 20  Ht 5\' 5"  (1.651 m)  Wt 125 lb (56.7 kg)  BMI 20.80 kg/m2  SpO2 95% Physical Exam 1340; Physical examination:  Nursing notes reviewed; Vital signs and O2 SAT reviewed;  Constitutional: Well developed, Well nourished, In no acute distress; Head:  Normocephalic, atraumatic; Eyes: EOMI, PERRL, No scleral icterus; ENMT: Mouth and pharynx normal, Mucous membranes dry and cracked.; Neck: Supple, Full range of motion, No lymphadenopathy; Cardiovascular: Tachycardic rate and rhythm, No gallop; Respiratory: Breath sounds coarse & equal bilaterally, No wheezes.  Speaking full sentences with ease, Normal  respiratory effort/excursion; Chest: Nontender, Movement normal; Abdomen: Soft, Nontender, Nondistended, Normal bowel sounds; Genitourinary: No CVA tenderness; Extremities: Pulses normal, No tenderness, No edema, No calf edema or asymmetry.; Neuro: Lethargic, moans and tries to talk when she is touched by ED staff. No facial droop. Moves all extremities spontaneously on stretcher.; Skin: Color normal, Warm, Dry. +scattered ecchymosis in various stages of healing.   ED Course  Procedures     EKG Interpretation None      MDM  MDM Reviewed: previous chart, nursing note and vitals Reviewed  previous: labs and ECG Interpretation: labs, ECG, x-ray and CT scan    Date: 05/06/2014  Rate: 104  Rhythm: sinus tachycardia  QRS Axis: normal  Intervals: normal  ST/T Wave abnormalities: nonspecific ST/T changes, artifact  Conduction Disutrbances:none  Narrative Interpretation:   Old EKG Reviewed: changes noted; TWA ant and inf leads on previous EKG dated 07/03/2012 is no longer present.  Results for orders placed or performed during the hospital encounter of 05/06/14  Urinalysis, Routine w reflex microscopic  Result Value Ref Range   Color, Urine YELLOW YELLOW   APPearance CLEAR CLEAR   Specific Gravity, Urine 1.025 1.005 - 1.030   pH 5.5 5.0 - 8.0   Glucose, UA NEGATIVE NEGATIVE mg/dL   Hgb urine dipstick LARGE (A) NEGATIVE   Bilirubin Urine NEGATIVE NEGATIVE   Ketones, ur 40 (A) NEGATIVE mg/dL   Protein, ur NEGATIVE NEGATIVE mg/dL   Urobilinogen, UA 0.2 0.0 - 1.0 mg/dL   Nitrite NEGATIVE NEGATIVE   Leukocytes, UA NEGATIVE NEGATIVE  Drug screen panel, emergency  Result Value Ref Range   Opiates NONE DETECTED NONE DETECTED   Cocaine NONE DETECTED NONE DETECTED   Benzodiazepines POSITIVE (A) NONE DETECTED   Amphetamines NONE DETECTED NONE DETECTED   Tetrahydrocannabinol NONE DETECTED NONE DETECTED   Barbiturates NONE DETECTED NONE DETECTED  CBC with Differential  Result Value Ref  Range   WBC 23.4 (H) 4.0 - 10.5 K/uL   RBC 4.26 3.87 - 5.11 MIL/uL   Hemoglobin 13.2 12.0 - 15.0 g/dL   HCT 40.2 36.0 - 46.0 %   MCV 94.4 78.0 - 100.0 fL   MCH 31.0 26.0 - 34.0 pg   MCHC 32.8 30.0 - 36.0 g/dL   RDW 13.9 11.5 - 15.5 %   Platelets 186 150 - 400 K/uL   Neutrophils Relative % 86 (H) 43 - 77 %   Neutro Abs 20.0 (H) 1.7 - 7.7 K/uL   Lymphocytes Relative 8 (L) 12 - 46 %   Lymphs Abs 1.9 0.7 - 4.0 K/uL   Monocytes Relative 6 3 - 12 %   Monocytes Absolute 1.5 (H) 0.1 - 1.0 K/uL   Eosinophils Relative 0 0 - 5 %   Eosinophils Absolute 0.0 0.0 - 0.7 K/uL   Basophils Relative 0 0 - 1 %   Basophils Absolute 0.0 0.0 - 0.1 K/uL  Comprehensive metabolic panel  Result Value Ref Range   Sodium 146 137 - 147 mEq/L   Potassium 3.9 3.7 - 5.3 mEq/L   Chloride 102 96 - 112 mEq/L   CO2 20 19 - 32 mEq/L   Glucose, Bld 135 (H) 70 - 99 mg/dL   BUN 23 6 - 23 mg/dL   Creatinine, Ser 1.03 0.50 - 1.10 mg/dL   Calcium 8.9 8.4 - 10.5 mg/dL   Total Protein 7.3 6.0 - 8.3 g/dL   Albumin 3.8 3.5 - 5.2 g/dL   AST 85 (H) 0 - 37 U/L   ALT 28 0 - 35 U/L   Alkaline Phosphatase 94 39 - 117 U/L   Total Bilirubin 0.2 (L) 0.3 - 1.2 mg/dL   GFR calc non Af Amer 55 (L) >90 mL/min   GFR calc Af Amer 64 (L) >90 mL/min   Anion gap 24 (H) 5 - 15  Ethanol  Result Value Ref Range   Alcohol, Ethyl (B) <11 0 - 11 mg/dL  Acetaminophen level  Result Value Ref Range   Acetaminophen (Tylenol), Serum <15.0 10 - 30 ug/mL  Troponin I  Result Value Ref Range   Troponin I 0.45 (HH) <1.47 ng/mL  Salicylate level  Result Value Ref Range   Salicylate Lvl 82.9 (H) 2.8 - 20.0 mg/dL  Protime-INR  Result Value Ref Range   Prothrombin Time 15.3 (H) 11.6 - 15.2 seconds   INR 1.20 0.00 - 1.49  Lactic acid, plasma  Result Value Ref Range   Lactic Acid, Venous 2.7 (H) 0.5 - 2.2 mmol/L  CK  Result Value Ref Range   Total CK 1549 (H) 7 - 177 U/L  Urine microscopic-add on  Result Value Ref Range   Squamous Epithelial  / LPF RARE RARE   RBC / HPF 3-6 <3 RBC/hpf   Ct Head Wo Contrast 05/06/2014   CLINICAL DATA:  Initial encounter for loss of consciousness this morning.  EXAM: CT HEAD WITHOUT CONTRAST  TECHNIQUE: Contiguous axial images were obtained from the base of the skull through the vertex without intravenous contrast.  COMPARISON:  09/16/2013.  FINDINGS: There is no evidence for acute hemorrhage, hydrocephalus, mass lesion, or abnormal extra-axial fluid collection. No definite CT evidence for acute infarction. Diffuse loss of parenchymal volume is consistent with atrophy. The visualized paranasal sinuses and mastoid air cells are clear.  IMPRESSION: No acute intracranial abnormality. Mild generalized atrophy is unchanged.   Electronically Signed   By: Misty Stanley M.D.   On: 05/06/2014 14:56   Dg Chest Port 1 View 05/06/2014   CLINICAL DATA:  Altered mental status.  EXAM: PORTABLE CHEST - 1 VIEW  COMPARISON:  09/16/2013  FINDINGS: New opacity at the right lung base could reflect atelectasis or infiltrate. Left lung is clear. Heart is normal size. No effusions or acute bony abnormality.  IMPRESSION: Right basilar atelectasis or infiltrate/pneumonia.   Electronically Signed   By: Rolm Baptise M.D.   On: 05/06/2014 13:55    1545  Judicious IVF given for clinical dehydration, elevated CK and lactic acid.  IV narcan given with good initial effect. RLL infiltrate on CXR, likely aspiration; will dose abx. Neb given for coarse lung sounds with improvement; Sats 94-95% on venti mask O2. Troponin elevated, but EKG with improved ST/T wave abnormalities compared to previous EKG; likely due to demand, will re-check 2nd level. Pt's ASA mildly elevated, will not give further ASA at this time. T/C to [facility]. Marily Memos, case discussed, including:  HPI, pertinent PM/SHx, VS/PE, dx testing, ED course and treatment:  Agreeable to admit, requests to write temporary orders, obtain stepdown bed to team APAdmits.  1620:  Pt had BM while  in the ED: stool heme positive per ED RN. Will collect ciff and GI pathogen panel.      Francine Graven, DO 05/10/14 1350

## 2014-05-06 NOTE — ED Notes (Signed)
Report given to ICU. Pt ready for transfer.

## 2014-05-07 ENCOUNTER — Encounter (HOSPITAL_COMMUNITY): Payer: Self-pay | Admitting: Adult Health

## 2014-05-07 DIAGNOSIS — I248 Other forms of acute ischemic heart disease: Secondary | ICD-10-CM

## 2014-05-07 LAB — COMPREHENSIVE METABOLIC PANEL
ALBUMIN: 3.2 g/dL — AB (ref 3.5–5.2)
ALK PHOS: 81 U/L (ref 39–117)
ALT: 30 U/L (ref 0–35)
ALT: 30 U/L (ref 0–35)
ALT: 35 U/L (ref 0–35)
ALT: 39 U/L — ABNORMAL HIGH (ref 0–35)
ALT: 39 U/L — ABNORMAL HIGH (ref 0–35)
ANION GAP: 19 — AB (ref 5–15)
ANION GAP: 16 — AB (ref 5–15)
ANION GAP: 13 (ref 5–15)
AST: 135 U/L — ABNORMAL HIGH (ref 0–37)
AST: 143 U/L — ABNORMAL HIGH (ref 0–37)
AST: 175 U/L — ABNORMAL HIGH (ref 0–37)
AST: 195 U/L — AB (ref 0–37)
AST: 189 U/L — ABNORMAL HIGH (ref 0–37)
Albumin: 3.4 g/dL — ABNORMAL LOW (ref 3.5–5.2)
Albumin: 3.1 g/dL — ABNORMAL LOW (ref 3.5–5.2)
Albumin: 3.1 g/dL — ABNORMAL LOW (ref 3.5–5.2)
Albumin: 3.3 g/dL — ABNORMAL LOW (ref 3.5–5.2)
Alkaline Phosphatase: 84 U/L (ref 39–117)
Alkaline Phosphatase: 79 U/L (ref 39–117)
Alkaline Phosphatase: 85 U/L (ref 39–117)
Alkaline Phosphatase: 85 U/L (ref 39–117)
Anion gap: 19 — ABNORMAL HIGH (ref 5–15)
Anion gap: 14 (ref 5–15)
BILIRUBIN TOTAL: 0.4 mg/dL (ref 0.3–1.2)
BUN: 18 mg/dL (ref 6–23)
BUN: 17 mg/dL (ref 6–23)
BUN: 12 mg/dL (ref 6–23)
BUN: 10 mg/dL (ref 6–23)
BUN: 8 mg/dL (ref 6–23)
CALCIUM: 8.4 mg/dL (ref 8.4–10.5)
CALCIUM: 8.7 mg/dL (ref 8.4–10.5)
CALCIUM: 8.7 mg/dL (ref 8.4–10.5)
CHLORIDE: 103 meq/L (ref 96–112)
CO2: 24 meq/L (ref 19–32)
CO2: 23 mEq/L (ref 19–32)
CO2: 25 mEq/L (ref 19–32)
CO2: 27 meq/L (ref 19–32)
CO2: 26 mEq/L (ref 19–32)
CREATININE: 0.94 mg/dL (ref 0.50–1.10)
Calcium: 8.4 mg/dL (ref 8.4–10.5)
Calcium: 8.4 mg/dL (ref 8.4–10.5)
Chloride: 102 mEq/L (ref 96–112)
Chloride: 104 mEq/L (ref 96–112)
Chloride: 100 mEq/L (ref 96–112)
Chloride: 102 mEq/L (ref 96–112)
Creatinine, Ser: 0.98 mg/dL (ref 0.50–1.10)
Creatinine, Ser: 0.84 mg/dL (ref 0.50–1.10)
Creatinine, Ser: 0.85 mg/dL (ref 0.50–1.10)
Creatinine, Ser: 0.82 mg/dL (ref 0.50–1.10)
GFR calc Af Amer: 72 mL/min — ABNORMAL LOW (ref >90)
GFR calc Af Amer: 82 mL/min — ABNORMAL LOW (ref >90)
GFR calc Af Amer: 85 mL/min — ABNORMAL LOW (ref >90)
GFR calc non Af Amer: 62 mL/min — ABNORMAL LOW (ref >90)
GFR calc non Af Amer: 71 mL/min — ABNORMAL LOW (ref >90)
GFR calc non Af Amer: 73 mL/min — ABNORMAL LOW (ref >90)
GFR, EST AFRICAN AMERICAN: 68 mL/min — AB (ref >90)
GFR, EST AFRICAN AMERICAN: 81 mL/min — AB (ref >90)
GFR, EST NON AFRICAN AMERICAN: 59 mL/min — AB (ref >90)
GFR, EST NON AFRICAN AMERICAN: 70 mL/min — AB (ref >90)
GLUCOSE: 147 mg/dL — AB (ref 70–99)
Glucose, Bld: 141 mg/dL — ABNORMAL HIGH (ref 70–99)
Glucose, Bld: 177 mg/dL — ABNORMAL HIGH (ref 70–99)
Glucose, Bld: 158 mg/dL — ABNORMAL HIGH (ref 70–99)
Glucose, Bld: 156 mg/dL — ABNORMAL HIGH (ref 70–99)
POTASSIUM: 2.9 meq/L — AB (ref 3.7–5.3)
Potassium: 2.9 mEq/L — CL (ref 3.7–5.3)
Potassium: 3.3 mEq/L — ABNORMAL LOW (ref 3.7–5.3)
Potassium: 3.2 mEq/L — ABNORMAL LOW (ref 3.7–5.3)
Potassium: 3.2 mEq/L — ABNORMAL LOW (ref 3.7–5.3)
SODIUM: 145 meq/L (ref 137–147)
SODIUM: 141 meq/L (ref 137–147)
SODIUM: 141 meq/L (ref 137–147)
Sodium: 146 mEq/L (ref 137–147)
Sodium: 144 mEq/L (ref 137–147)
TOTAL PROTEIN: 6.4 g/dL (ref 6.0–8.3)
TOTAL PROTEIN: 6.5 g/dL (ref 6.0–8.3)
Total Bilirubin: 0.2 mg/dL — ABNORMAL LOW (ref 0.3–1.2)
Total Bilirubin: 0.3 mg/dL (ref 0.3–1.2)
Total Bilirubin: 0.4 mg/dL (ref 0.3–1.2)
Total Bilirubin: 0.5 mg/dL (ref 0.3–1.2)
Total Protein: 6.8 g/dL (ref 6.0–8.3)
Total Protein: 6.4 g/dL (ref 6.0–8.3)
Total Protein: 6.7 g/dL (ref 6.0–8.3)

## 2014-05-07 LAB — TROPONIN I
TROPONIN I: 0.72 ng/mL — AB (ref <0.30)
TROPONIN I: 0.58 ng/mL — AB (ref <0.30)

## 2014-05-07 LAB — CBC
HEMATOCRIT: 34.1 % — AB (ref 36.0–46.0)
HEMOGLOBIN: 11.9 g/dL — AB (ref 12.0–15.0)
MCH: 32.5 pg (ref 26.0–34.0)
MCHC: 34.9 g/dL (ref 30.0–36.0)
MCV: 93.2 fL (ref 78.0–100.0)
Platelets: 156 10*3/uL (ref 150–400)
RBC: 3.66 MIL/uL — AB (ref 3.87–5.11)
RDW: 14.3 % (ref 11.5–15.5)
WBC: 25.1 10*3/uL — AB (ref 4.0–10.5)

## 2014-05-07 LAB — URINE CULTURE
CULTURE: NO GROWTH
Colony Count: NO GROWTH

## 2014-05-07 LAB — OCCULT BLOOD, POC DEVICE: Fecal Occult Bld: POSITIVE — AB

## 2014-05-07 LAB — MAGNESIUM: MAGNESIUM: 2.1 mg/dL (ref 1.5–2.5)

## 2014-05-07 LAB — HIV ANTIBODY (ROUTINE TESTING W REFLEX): HIV: NONREACTIVE

## 2014-05-07 LAB — STREP PNEUMONIAE URINARY ANTIGEN: Strep Pneumo Urinary Antigen: NEGATIVE

## 2014-05-07 MED ORDER — POTASSIUM CHLORIDE 10 MEQ/100ML IV SOLN
INTRAVENOUS | Status: AC
  Administered 2014-05-07: 10 meq
  Filled 2014-05-07: qty 400

## 2014-05-07 MED ORDER — ASPIRIN 325 MG PO TABS
325.0000 mg | ORAL_TABLET | Freq: Every day | ORAL | Status: DC
  Administered 2014-05-07 – 2014-05-08 (×2): 325 mg via ORAL
  Filled 2014-05-07 (×2): qty 1

## 2014-05-07 MED ORDER — POTASSIUM CHLORIDE 10 MEQ/100ML IV SOLN
10.0000 meq | INTRAVENOUS | Status: AC
  Administered 2014-05-07 (×4): 10 meq via INTRAVENOUS

## 2014-05-07 MED ORDER — ALUM & MAG HYDROXIDE-SIMETH 200-200-20 MG/5ML PO SUSP
30.0000 mL | Freq: Four times a day (QID) | ORAL | Status: DC | PRN
  Administered 2014-05-07: 30 mL via ORAL
  Filled 2014-05-07: qty 30

## 2014-05-07 MED ORDER — TRAMADOL HCL 50 MG PO TABS
50.0000 mg | ORAL_TABLET | Freq: Two times a day (BID) | ORAL | Status: DC | PRN
  Administered 2014-05-07 – 2014-05-08 (×3): 50 mg via ORAL
  Filled 2014-05-07 (×3): qty 1

## 2014-05-07 MED ORDER — POTASSIUM CHLORIDE 10 MEQ/100ML IV SOLN
10.0000 meq | INTRAVENOUS | Status: AC
  Administered 2014-05-07 (×5): 10 meq via INTRAVENOUS
  Filled 2014-05-07: qty 100

## 2014-05-07 MED ORDER — LORAZEPAM 0.5 MG PO TABS
0.5000 mg | ORAL_TABLET | Freq: Four times a day (QID) | ORAL | Status: DC | PRN
  Administered 2014-05-07 – 2014-05-08 (×3): 0.5 mg via ORAL
  Filled 2014-05-07 (×3): qty 1

## 2014-05-07 MED ORDER — ASPIRIN 300 MG RE SUPP
300.0000 mg | Freq: Every day | RECTAL | Status: DC
  Administered 2014-05-07: 300 mg via RECTAL
  Filled 2014-05-07: qty 1

## 2014-05-07 NOTE — Care Management Utilization Note (Signed)
UR complete 

## 2014-05-07 NOTE — Progress Notes (Signed)
Ashley Hall SJG:[id number] DOB: Oct 20, 1945 DOA: 05/06/2014 PCP: Maricela Curet, MD             Physical Exam: Blood pressure 141/51, pulse 84, temperature 98.9 F (37.2 C), temperature source Oral, resp. rate 19, height 5\' 4"  (1.626 m), weight 145 lb 11.6 oz (66.1 kg), SpO2 98 %. no JVD no carotid bruits no thyromegaly lungs diminished breath sounds at the bases no rales wheeze or rhonchi appreciable heart regular rhythm no S3-S4 no heaves thrills or rubs abdomen soft no right upper quadrant tenderness to guarding or rebound masses or megaly   Investigations:  Recent Results (from the past 240 hour(s))  MRSA PCR Screening     Status: Abnormal   Collection Time: 05/06/14  5:20 PM  Result Value Ref Range Status   MRSA by PCR POSITIVE (A) NEGATIVE Final    Comment:        The GeneXpert MRSA Assay (FDA approved for NASAL specimens only), is one component of a comprehensive MRSA colonization surveillance program. It is not intended to diagnose MRSA infection nor to guide or monitor treatment for MRSA infections. RESULT CALLED TO, READ BACK BY AND VERIFIED WITH: HAMMAOCK S AT 1954 ON [id number] BY FORSYTH K   Culture, blood (routine x 2) Call MD if unable to obtain prior to antibiotics being given     Status: None (Preliminary result)   Collection Time: 05/06/14  8:07 PM  Result Value Ref Range Status   Specimen Description BLOOD RIGHT HAND  Final   Special Requests   Final    BOTTLES DRAWN AEROBIC AND ANAEROBIC AEB 10CC ANA 5CC   Culture NO GROWTH 1 DAY  Final   Report Status PENDING  Incomplete  Culture, blood (routine x 2) Call MD if unable to obtain prior to antibiotics being given     Status: None (Preliminary result)   Collection Time: 05/06/14  8:07 PM  Result Value Ref Range Status   Specimen Description BLOOD LEFT HAND  Final   Special Requests BOTTLES DRAWN AEROBIC AND ANAEROBIC [place]  Final   Culture NO GROWTH 1 DAY  Final   Report Status PENDING   Incomplete     Basic Metabolic Panel:  Recent Labs  05/06/14 1534  05/07/14 0548 05/07/14 1040  NA  --   < > 146 141  K  --   < > 3.3* 2.9*  CL  --   < > 104 100  CO2  --   < > 23 25  GLUCOSE  --   < > 177* 158*  BUN  --   < > 17 12  CREATININE  --   < > 0.94 0.84  CALCIUM  --   < > 8.4 8.4  MG 2.4  --   --  2.1  PHOS 3.0  --   --   --   < > = values in this interval not displayed. Liver Function Tests:  Recent Labs  05/07/14 0548 05/07/14 1040  AST 143* 175*  ALT 30 35  ALKPHOS 79 81  BILITOT 0.3 0.4  PROT 6.4 6.4  ALBUMIN 3.1* 3.1*     CBC:  Recent Labs  05/06/14 1302 05/07/14 0548  WBC 23.4* 25.1*  NEUTROABS 20.0*  --   HGB 13.2 11.9*  HCT 40.2 34.1*  MCV 94.4 93.2  PLT 186 156    Ct Head Wo Contrast  05/06/2014   CLINICAL DATA:  Initial encounter for loss of consciousness this morning.  EXAM: CT HEAD WITHOUT CONTRAST  TECHNIQUE: Contiguous axial images were obtained from the base of the skull through the vertex without intravenous contrast.  COMPARISON:  09/16/2013.  FINDINGS: There is no evidence for acute hemorrhage, hydrocephalus, mass lesion, or abnormal extra-axial fluid collection. No definite CT evidence for acute infarction. Diffuse loss of parenchymal volume is consistent with atrophy. The visualized paranasal sinuses and mastoid air cells are clear.  IMPRESSION: No acute intracranial abnormality. Mild generalized atrophy is unchanged.   Electronically Signed   By: Misty Stanley M.D.   On: 05/06/2014 14:56   Dg Chest Port 1 View  05/06/2014   CLINICAL DATA:  Altered mental status.  EXAM: PORTABLE CHEST - 1 VIEW  COMPARISON:  09/16/2013  FINDINGS: New opacity at the right lung base could reflect atelectasis or infiltrate. Left lung is clear. Heart is normal size. No effusions or acute bony abnormality.  IMPRESSION: Right basilar atelectasis or infiltrate/pneumonia.   Electronically Signed   By: Rolm Baptise M.D.   On: 05/06/2014 13:55       Medications:   Impression:  Principal Problem:   Altered mental state Active Problems:   Essential hypertension   COPD (chronic obstructive pulmonary disease)   Tobacco abuse   ETOH abuse   Drug overdose   Elevated troponin   CAP (community acquired pneumonia)   Lactic acidosis   Leukocytosis   Severe depression   Insomnia   Hypothyroidism   GERD (gastroesophageal reflux disease)   Rhabdomyolysis   Physical deconditioning   Altered mental status   Aspiration pneumonia   Demand ischemia     Plan: Add Ativan 0.5 by mouth every 6 hours for possible withdrawal KCl 10 mEq IV 4 monitor B med LFTs and WBCs in a.m. continue troponins as per cardiology await 2-D echo  Consultants: Cardiology  Procedures   Antibiotics: Vancomycin and Zosyn IV                  Code Status: Full   Family Communication:  Niece and patient  Disposition Plan we'll look for SNF or assisted living  Time spent: 30 minutes   LOS: 1 day   Khair Chasteen M   05/07/2014, 12:42 PM

## 2014-05-07 NOTE — Progress Notes (Signed)
PT IS NOW ALERT AND ORIENTED. 1:1 SITTER HAS BEEN D/C'D. O2 AT 2L/MIN VIA [place]. NO RESP DISTRESS. HR 86 IN NSR. LT HAND NSL PATENT. LT AC IV PATENT. FOLEY CATH PATENT DRAINING CLEAR YELLOW URINE. PT HAS PERIODS OF CONFUSION. BED ALARM ON. NIECE AT BEDSIDE. OT TRANSFERRING TO ROOM 313 MED/SURG. TRANSFER REPORT CALLED TO JOANNA RN ON 300.

## 2014-05-07 NOTE — Progress Notes (Signed)
Tele psych eval completed this afternoon and it was suggested that pt wait for placement in geriatric facility

## 2014-05-07 NOTE — Care Management Note (Addendum)
    Page 1 of 1   05/08/2014     1:09:16 PM CARE MANAGEMENT NOTE 05/08/2014  Patient:  Scaff,Charmel A   Account Number:  [id number]  Date Initiated:  05/07/2014  Documentation initiated by:  CHILDRESS,JESSICA  Subjective/Objective Assessment:   Pt admitted following overdose. Pt is from home, lives alone in senior housing appartments. Pt has AID through [facility] that comes 7 days a week or approx 6 hours a day. Pt has rollator at home. Pt's neice in room during assessment.     Action/Plan:   CSW seeing pt also. Discharge plan pending, awaiting medical clearance, PT eval and TTS eval. Pt possibly interested in ALF placement at discharge. Will continue to follow for CM needs.   Anticipated DC Date:  05/10/2014   Anticipated DC Plan:  HOME/SELF CARE  In-house referral  Clinical Social Worker      DC Planning Services  CM consult      Choice offered to / List presented to:             Status of service:  Completed, signed off Medicare Important Message given?  YES (If response is "NO", the following Medicare IM given date fields will be blank) Date Medicare IM given:  05/08/2014 Medicare IM given by:  Vladimir Creeks Date Additional Medicare IM given:   Additional Medicare IM given by:    Discharge Disposition:  [place]  Per UR Regulation:    If discussed at Long Length of Stay Meetings, dates discussed:    Comments:  05/08/2014 [place], RN, MSN, [facility] Pt will be discharged to SNF. CSW aware of needs and will arrange for discharge. Pt has no further CM needs at this time.  05/07/2014 [place], RN, MSN, [employer]

## 2014-05-07 NOTE — BHH Counselor (Signed)
Assessment complete.  Ashley Hall, [facility] Triage Specialist

## 2014-05-07 NOTE — Progress Notes (Signed)
RN paged NP with elevated troponin level. Levels have been elevated since admission but trending upward. Likely demand ischemia due to pt being found down. After 2nd troponin, admitting MD spoke with cardiology who agreed with demand theory and advised to continue to trend enzymes and call cardio for consult in am. Last troponin .7. Pt is sedated and unable to c/o chest pain. EKG repeated and is without acute changes. Will go ahead and give pt an ASA suppository and report to attending in am for possible cardio consult.  Clance Boll, NP [facility]

## 2014-05-07 NOTE — Clinical Social Work Psychosocial (Signed)
Clinical Social Work Department BRIEF PSYCHOSOCIAL ASSESSMENT 05/07/2014  Patient:  Ashley Hall,Ashley Hall     Account Number:  [id number]     Admit date:  05/06/2014  Clinical Social Worker:  Legrand Como  Date/Time:  05/07/2014 12:35 PM  Referred by:  CSW  Date Referred:  05/07/2014 Referred for  Other - See comment   Other Referral:   Interview type:  Patient Other interview type:   Patient  Ashley Hall    PSYCHOSOCIAL DATA Living Status:  ALONE Admitted from facility:   Level of care:   Primary support name:  Leann Hall Primary support relationship to patient:  FAMILY Degree of support available:   Neice is supportive.    CURRENT CONCERNS Current Concerns  Post-Acute Placement   Other Concerns:    SOCIAL WORK ASSESSMENT / PLAN CSW met with patient and neice. Patient was oriented x4. Patient showed minimal participation as she appeared drowsy.  Ms. Archie Balboa indicated that patient lives alone in [place] in an apartment. She stated that patient receives six hours of aid from [facility] seven days per week.  Ms. Archie Balboa indicated Alvis Lemmings prepares patient's food, assist with bathing/dressing, cleans and transports her to doctor's appointments and the grocery store.  She indicated that patient ambulates with Hall rollator walker, but she falls often.  [name] indicated that patient has Hall poor quality of life living alone.  She stated that patient needed an assisted living facility and several months ago, she had looked into patient going to [facility].  She indicated that during this time Ashley Hall indicated that patient needed to go to skilled to regulate her medications prior to coming to [facility].  Ms. Archie Balboa indicated that patient was agreeable to go to an ALF during that time.  She stated that patient has Hall son, but they have not spoken in 10-15 years.  Ms. Archie Balboa indicated that patient use to be Hall heavy alcohol user, but she had been sober for 10  years until the day after Thanksgiving.  She stated that on the day after Thanksgiving client began drinking vodka again. Ms. Archie Balboa indictated that patient has been suicidal in the past and that she frequent makes statements about being suicidal.  She stated that patient "can be Hall handful" and has been discharged from several doctors.  Ms. Archie Balboa indicated that she visits patient about three times per months and talks to her on the phone Hall few times per week. Patient reported that she did not know why she was in the hospital. Patient indicated that she was had been drinking but has no memory of what lead her tothe hospital.  She stated that she drank Hall 5th of 90 proof vodka.  Paitent indicated that she has been hiding her vodka in her walker. Patient then fell asleep.   Assessment/plan status:  Information/Referral to [employer] Other assessment/ plan:   Information/referral to community resources:    PATIENT'S/FAMILY'S RESPONSE TO PLAN OF CARE: Ashley Hall is agreeable to ALF.  CSW  speak with patient  once she is rested about her desires upon discharge.     Ambrose Pancoast, [place]

## 2014-05-07 NOTE — Progress Notes (Signed)
Pt removed IV's x2 around 1645. Pt states that "they were hurting". Pt will not allow re-attempt until after dinner. Will continue to be monitor. Administration of anti-biotic will be delayed.

## 2014-05-07 NOTE — Progress Notes (Signed)
CARDIOLOGY CONSULT NOTE   Patient ID: Ashley Hall MRN: [id number] DOB/AGE: 66-Oct-1947 66 y.o.  Admit Date: 05/06/2014 Referring Physician: PTH Primary Physician: Maricela Curet, MD Consulting Cardiologist: Carlyle Dolly MD Primary Cardiologist: Minus Breeding MD Reason for Consultation: Positive Troponin   Clinical Summary Ashley Hall is a 68 y.o.female with known history of ETOH abuse, BiPolar, COPD,narcotic dependence,  with recent treatment for respiratory infection who was "found down" at home by visiting nurse surrounded by empty alcohol bottles, mediation bottles, covered with excrement and confused. Review of prior notes does not demonstrate prior cardiac hx. She remembers taking medications for pain, drinking a lot of Vodka. Complained of headache and chronic back pain.   On arrival to ER, BP 153/62, HR 110 bpm. Troponin 0.56;follow up levels, 0.70, 0.72, EKG NSR with septal; Q-waves, CXR right basilar pneumonia or infiltrate. CT head, no acute intracranial abnormality. Treated with narcan, albuterol, zosyn and IV fluids. Admitted to ICU for further management. She was found to have elevated CPK likely from rhabdomyolysis vs fall.   Patient is still somewhat confused and therefore a poor historian. Hx is obtained from past medical records. Saw Dr. Percival Spanish in 2012 had echo (reprt below) and planned for stress test, but do no see results in Epic.   Allergies  Allergen Reactions  . Pregabalin     REACTION: "couldn't move"    Medications Scheduled Medications: . amLODipine  5 mg Oral Daily  . antiseptic oral rinse  7 mL Mouth Rinse q12n4p  . aspirin  325 mg Oral Daily  . azithromycin  500 mg Intravenous Q24H  . chlorhexidine  15 mL Mouth Rinse BID  . Chlorhexidine Gluconate Cloth  6 each Topical Q0600  . DULoxetine  60 mg Oral Daily  . gabapentin  300 mg Oral TID  . heparin  5,000 Units Subcutaneous 3 times per day  . levothyroxine  50 mcg Oral QAC breakfast   . metoprolol  5 mg Intravenous 4 times per day  . mirtazapine  15 mg Oral QHS  . mupirocin ointment  1 application Nasal BID  . pantoprazole  40 mg Oral Daily  . piperacillin-tazobactam (ZOSYN)  IV  3.375 g Intravenous Q8H  . pravastatin  40 mg Oral QHS  . senna  1 tablet Oral BID  . sodium chloride  1,000 mL Intravenous Once  . sodium chloride  3 mL Intravenous Q12H  . topiramate  100 mg Oral BID  . vancomycin  500 mg Intravenous Q12H    Infusions: . sodium chloride 150 mL/hr at 05/07/14 0800    PRN Medications: alum & mag hydroxide-simeth, lidocaine, LORazepam, ondansetron **OR** ondansetron (ZOFRAN) IV, polyethylene glycol   Past Medical History  Diagnosis Date  . Arthritis   . Chronic back pain     Dr Merlene Laughter  . Chronic constipation   . S/P colonoscopy 12/28/05    Dr Catalina Antigua prep, lipoma left colon  . Sinus drainage   . Alcohol abuse, in remission   . COPD (chronic obstructive pulmonary disease)   . Sedative, hypnotic or anxiolytic dependence   . Neurotic depression   . Personality disorder   . ETOH abuse     Past Surgical History  Procedure Laterality Date  . Complete hysterectomy    . Appendectomy      Family History  Problem Relation Age of Onset  . Heart attack Mother     Deceased  . Diabetes Mother     Deceased  . Cancer Father  Deceased  . Schizophrenia Brother     Social History Ashley Hall reports that she has quit smoking. Her smoking use included Cigarettes. She has a 72 pack-year smoking history. She does not have any smokeless tobacco history on file. Ashley Hall reports that she drinks alcohol.  Review of Systems Complete review of systems are found to be negative unless outlined in H&P above.  Physical Examination Blood pressure 137/60, pulse 87, temperature 98.9 F (37.2 C), temperature source Oral, resp. rate 18, height 5\' 4"  (1.626 m), weight 145 lb 11.6 oz (66.1 kg), SpO2 95 %.  Intake/Output Summary (Last 24 hours) at  05/07/14 1023 Last data filed at 05/07/14 0908  Gross per 24 hour  Intake   3915 ml  Output   1200 ml  Net   2715 ml    Telemetry: NSR. Rates in the 80's   GEN: No acute distress HEENT: Conjunctiva and lids normal, oropharynx clear with moist mucosa. Neck: Supple, no elevated JVP or carotid bruits, no thyromegaly. Lungs: Crackles noted in the bases, R>L no wheezes Cardiac: Regular rate and rhythm,tachycardic, no S3 or significant systolic murmur, no pericardial rub. Abdomen: Soft, nontender, no hepatomegaly, bowel sounds present, no guarding or rebound. Extremities: No pitting edema, distal pulses 2+.Multiple bruises and lacerations on extremities. Skin: Warm and dry. Musculoskeletal: No kyphosis. Neuropsychiatric: Alert and mild confusion affect grossly appropriate.  Prior Cardiac Testing/Procedures 1.Echo 4.2006 1. Technically suboptimal but adequate echocardiographic study. 2. Normal left atrium, right atrium and right ventricle. 3. Normal mitral valve; mild annular calcification. 4. Mild to moderate aortic valvular sclerosis without stenosis; mild  annular calcification; trivial in this aortic insufficiency. 5. Normal tricuspid valve. 6. Normal left ventricular size; left ventricular wall thickness of the  upper limit of normal; normal regional and global function. 7. Normal IVC. Lab Results  Basic Metabolic Panel:  Recent Labs Lab 05/06/14 1302 05/06/14 1534 05/06/14 2217 05/07/14 0154 05/07/14 0548  NA 146  --  146 145 146  K 3.9  --  2.8* 2.9* 3.3*  CL 102  --  103 102 104  CO2 20  --  21 24 23   GLUCOSE 135*  --  193* 141* 177*  BUN 23  --  21 18 17   CREATININE 1.03  --  0.98 0.98 0.94  CALCIUM 8.9  --  8.2* 8.4 8.4  MG  --  2.4  --   --   --   PHOS  --  3.0  --   --   --     Liver Function Tests:  Recent Labs Lab 05/06/14 1302 05/06/14 2217 05/07/14 0154 05/07/14 0548  AST 85* 104* 135* 143*  ALT 28 27 30 30   ALKPHOS 94 81  84 79  BILITOT 0.2* 0.1* 0.2* 0.3  PROT 7.3 6.5 6.8 6.4  ALBUMIN 3.8 3.2* 3.4* 3.1*    CBC:  Recent Labs Lab 05/06/14 1302 05/07/14 0548  WBC 23.4* 25.1*  NEUTROABS 20.0*  --   HGB 13.2 11.9*  HCT 40.2 34.1*  MCV 94.4 93.2  PLT 186 156    Cardiac Enzymes:  Recent Labs Lab 05/06/14 1302 05/06/14 1532 05/06/14 2217 05/07/14 0548  CKTOTAL 1549*  --   --   --   TROPONINI 0.45* 0.56* 0.70* 0.72*    BNP: Invalid input(s): POCBNP   Radiology: Ct Head Wo Contrast  05/06/2014   CLINICAL DATA:  Initial encounter for loss of consciousness this morning.  EXAM: CT HEAD WITHOUT CONTRAST  TECHNIQUE: Contiguous axial  images were obtained from the base of the skull through the vertex without intravenous contrast.  COMPARISON:  09/16/2013.  FINDINGS: There is no evidence for acute hemorrhage, hydrocephalus, mass lesion, or abnormal extra-axial fluid collection. No definite CT evidence for acute infarction. Diffuse loss of parenchymal volume is consistent with atrophy. The visualized paranasal sinuses and mastoid air cells are clear.  IMPRESSION: No acute intracranial abnormality. Mild generalized atrophy is unchanged.   Electronically Signed   By: Misty Stanley M.D.   On: 05/06/2014 14:56   Dg Chest Port 1 View  05/06/2014   CLINICAL DATA:  Altered mental status.  EXAM: PORTABLE CHEST - 1 VIEW  COMPARISON:  09/16/2013  FINDINGS: New opacity at the right lung base could reflect atelectasis or infiltrate. Left lung is clear. Heart is normal size. No effusions or acute bony abnormality.  IMPRESSION: Right basilar atelectasis or infiltrate/pneumonia.   Electronically Signed   By: Rolm Baptise M.D.   On: 05/06/2014 13:55     ECG: NSR with incomplete RBBB, septal Q-waves.    Impression and Recommendations  1. Positive Troponins: Likely demand ischemia is the setting of drug overdose and ETOH abuse causing probable unconsciousness. She was found on the floor, incontinent of feces and urine.  Also positive for rhabdomyolysis.  She is receiving IV hydration. Continues to have complaints of headache and back pain. Will check echo in the setting of ETOH abuse for cardiomyopathy. If significant decrease in EF will discuss possible ischemic testing. With hx of ETOH and narcotic dependence, would worry about compliance with medication regimen if ischemia is noted. Likely treat medically instead of cardiac cath if EF is depressed, as antiplatelet therapy, if necessary, would be difficult in overall social and mental history.   2.COPD with pneumonia: Abx treatment per PTH  3. Bipolar disorder: Admissions to [facility] is noted in [place]. Consider this on discharge once stable   4. ETOH abuse: Alcohol level <11. She admits to drinking a lot of Vodka over the last couple of days. On DT protocol.   5. Hypokalemia: Currently being repleted. With ETOH abuse will check Mg level.    Signed: Phill Myron. Lawrence NP [place]  05/07/2014, 10:23 AM Co-Sign MD

## 2014-05-07 NOTE — Progress Notes (Signed)
Present with patient for emotional and spiritual support. Patient seemed at times confused but also shared much about a broken relationship with her son. A niece was present and shared some about the recent deaths of several family members. Discussed briefly grief support.

## 2014-05-07 NOTE — BH Assessment (Addendum)
Tele Assessment Note   Ashley Hall is an 68 y.o. female.The Pt was found unresponsive at home surrounded by her medications. Pt was covered in urine and feces as well. The Pt was transported to [facility] ED. The Pt is stable and alert at this time. The ED did not find pills in the Pt's system but alcohol was found in her system. Pt admits to mixing her medication with pills and alcohol. Pt reports that she was sober for 10 years but she recently began drinking again. The Pt has multiple physical health concerns and has been diagnosed with depression. The Pt was prescribed Cymbalta and Xanax for her depression. The Pt has [facility] come to her home six hours a day to take care of her needs. The Pt cannot complete all of her ADLs. The Pt's niece Ashley Hall reports that the Pt has difficulty taking care of herself when the nurses leave. According to Ashley Hall, the Pt falls frequently at home and has numerous bruises from falling. The Pt's nurse is concerned about the Pt's wellbeing. The Pt reports that she is not SI/HI at this time.Ashley Hall reports that the Pt makes statements such as: "I hurt so bad I just want to go away." Pt admits that when she gets lonely she has suicidal ideations. Pt reports 1 or 2 ideations a week.  The writer consulted with NP-Shelly. Per Shelly the Pt meets inpatient criteria. Per Shelly the Pt needs a Geriatric placement. Pt's RN informed of placement.  Axis I: Depressive Disorder NOS Axis II: Deferred Axis III:  Past Medical History  Diagnosis Date  . Arthritis   . Chronic back pain     Dr Merlene Laughter  . Chronic constipation   . S/P colonoscopy 12/28/05    Dr Catalina Antigua prep, lipoma left colon  . Sinus drainage   . Alcohol abuse, in remission   . COPD (chronic obstructive pulmonary disease)   . Sedative, hypnotic or anxiolytic dependence   . Neurotic depression   . Personality disorder   . ETOH abuse    Axis IV: economic problems, housing problems and  problems with primary support group Axis V: 51-60 moderate symptoms  Past Medical History:  Past Medical History  Diagnosis Date  . Arthritis   . Chronic back pain     Dr Merlene Laughter  . Chronic constipation   . S/P colonoscopy 12/28/05    Dr Catalina Antigua prep, lipoma left colon  . Sinus drainage   . Alcohol abuse, in remission   . COPD (chronic obstructive pulmonary disease)   . Sedative, hypnotic or anxiolytic dependence   . Neurotic depression   . Personality disorder   . ETOH abuse     Past Surgical History  Procedure Laterality Date  . Complete hysterectomy    . Appendectomy      Family History:  Family History  Problem Relation Age of Onset  . Heart attack Mother     Deceased  . Diabetes Mother     Deceased  . Cancer Father     Deceased  . Schizophrenia Brother     Social History:  reports that she has quit smoking. Her smoking use included Cigarettes. She has a 72 pack-year smoking history. She does not have any smokeless tobacco history on file. She reports that she drinks alcohol. She reports that she does not use illicit drugs.  Additional Social History:  Alcohol / Drug Use Pain Medications: Pt denies Prescriptions: Pt denies Over the Counter: Pt denies  History of alcohol / drug use?: Yes Longest period of sobriety (when/how long): 10 yrs Negative Consequences of Use: Financial, Legal, Personal relationships, Work / School Substance #1 Name of Substance 1: Alcohol 1 - Age of First Use: Unknown 1 - Amount (size/oz): Unknown 1 - Frequency: weekly 1 - Duration: ongoing 1 - Last Use / Amount: Unknown  CIWA: CIWA-Ar BP: (!) 147/66 mmHg Pulse Rate: 87 Nausea and Vomiting: no nausea and no vomiting Tactile Disturbances: none Tremor: no tremor Auditory Disturbances: not present Paroxysmal Sweats: no sweat visible Visual Disturbances: not present Anxiety: two Headache, Fullness in Head: mild Agitation: somewhat more than normal activity Orientation and  Clouding of Sensorium: cannot do serial additions or is uncertain about date CIWA-Ar Total: 5 COWS:    PATIENT STRENGTHS: (choose at least two) Communication skills Supportive family/friends  Allergies:  Allergies  Allergen Reactions  . Pregabalin     REACTION: "couldn't move"    Home Medications:  Medications Prior to Admission  Medication Sig Dispense Refill  . Ascorbic Acid (VITAMIN C) 1000 MG tablet Take 1,000 mg by mouth daily.      . B Complex-C (SUPER B COMPLEX PO) Take 1 tablet by mouth daily.    . Calcium Carbonate-Vitamin D (OYSTER SHELL CALCIUM 500 + D) 500-125 MG-UNIT TABS Take 1 tablet by mouth daily.     . Cholecalciferol (VITAMIN D) 1000 UNITS capsule Take 2,000 Units by mouth daily.     . cimetidine (TAGAMET) 400 MG tablet Take 400 mg by mouth 2 (two) times daily.  3  . ferrous gluconate (FERGON) 325 MG tablet Take 325 mg by mouth daily with breakfast.      . fish oil-omega-3 fatty acids 1000 MG capsule Take 2 g by mouth daily.      Marland Kitchen levothyroxine (SYNTHROID, LEVOTHROID) 50 MCG tablet Take 50 mcg by mouth daily.  11  . metoprolol tartrate (LOPRESSOR) 25 MG tablet Take 37.5 mg by mouth 2 (two) times daily.     . Multiple Vitamin (MULTIVITAMIN) capsule Take 1 capsule by mouth daily.      Marland Kitchen oxyCODONE-acetaminophen (PERCOCET) 10-325 MG per tablet Take 1 tablet by mouth 4 (four) times daily as needed.    . polyethylene glycol powder (GLYCOLAX/MIRALAX) powder Take 17 g by mouth daily.  3  . pravastatin (PRAVACHOL) 40 MG tablet Take 40 mg by mouth at bedtime.  5  . QC LORATADINE ALLERGY RELIEF 10 MG tablet Take 10 mg by mouth daily.  3  . traZODone (DESYREL) 150 MG tablet Take 300 mg by mouth 2 (two) times daily.  3  . vitamin A 10000 UNIT capsule Take 10,000 Units by mouth daily.    . vitamin E 400 UNIT capsule Take 400 Units by mouth daily.      Marland Kitchen albuterol (PROVENTIL HFA;VENTOLIN HFA) 108 (90 BASE) MCG/ACT inhaler Inhale 2 puffs into the lungs every 6 (six) hours as  needed. For shortness of breath    . albuterol (PROVENTIL) (2.5 MG/3ML) 0.083% nebulizer solution Take 2.5 mg by nebulization every 6 (six) hours as needed.      . ALPRAZolam (XANAX) 1 MG tablet Take 1 mg by mouth 4 (four) times daily.    Marland Kitchen amLODipine (NORVASC) 5 MG tablet Take 5 mg by mouth daily.      . celecoxib (CELEBREX) 200 MG capsule Take 200 mg by mouth 2 (two) times daily.      . DULoxetine (CYMBALTA) 60 MG capsule Take 60 mg by mouth daily.      Marland Kitchen  gabapentin (NEURONTIN) 300 MG capsule Take 300 mg by mouth Daily.    Marland Kitchen lidocaine (LIDODERM) 5 % Place 1 patch onto the skin daily. Remove & Discard patch within 12 hours or as directed by MD     . mirtazapine (REMERON) 15 MG tablet Take 15 mg by mouth at bedtime.      . pantoprazole (PROTONIX) 40 MG tablet Take 40 mg by mouth daily.      . Potassium 99 MG TABS Take by mouth.      . topiramate (TOPAMAX) 100 MG tablet Take 100 mg by mouth 2 (two) times daily.      . traZODone (DESYREL) 100 MG tablet Take 100 mg by mouth at bedtime.        OB/GYN Status:  No LMP recorded. Patient has had a hysterectomy.  General Assessment Data Location of Assessment: [facility] ED ACT Assessment: Yes Is this a Tele or Face-to-Face Assessment?: Tele Assessment Is this an Initial Assessment or a Re-assessment for this encounter?: Initial Assessment Living Arrangements: Alone Can pt return to current living arrangement?: Yes Admission Status: Voluntary Is patient capable of signing voluntary admission?: Yes Transfer from: Home Referral Source: Medical Floor Inpatient     [place] Living Arrangements: Alone Name of Psychiatrist: None Name of Therapist: None  Education Status Is patient currently in school?: No Current Grade: NA Highest grade of school patient has completed: Na Name of school: NA Contact person: NA  Risk to self with the past 6 months Suicidal Ideation: No Suicidal Intent: No Is patient at risk for suicide?: No Suicidal  Plan?: No Access to Means: No What has been your use of drugs/alcohol within the last 12 months?: Alcohol Previous Attempts/Gestures: Yes How many times?: 1 Other Self Harm Risks: Difficulty taking care of basic needs Triggers for Past Attempts: Other (Comment) (Lonely) Intentional Self Injurious Behavior: None Family Suicide History: Unknown Recent stressful life event(s): Other (Comment) (Lonely) Persecutory voices/beliefs?: No Depression: Yes Depression Symptoms: Tearfulness, Fatigue, Feeling worthless/self pity Substance abuse history and/or treatment for substance abuse?: Yes Suicide prevention information given to non-admitted patients: Not applicable  Risk to Others within the past 6 months Homicidal Ideation: No Thoughts of Harm to Others: No Current Homicidal Intent: No Current Homicidal Plan: No Access to Homicidal Means: No Identified Victim: None History of harm to others?: No Assessment of Violence: None Noted Violent Behavior Description: None Does patient have access to weapons?: No Criminal Charges Pending?: No Does patient have a court date: No  Psychosis Hallucinations: None noted Delusions: None noted  Mental Status Report Appear/Hygiene: In hospital gown, Unremarkable Eye Contact: Good Motor Activity: Unsteady Speech: Logical/coherent Level of Consciousness: Alert Mood: Euthymic Affect: Appropriate to circumstance Anxiety Level: Minimal Thought Processes: Coherent, Relevant Judgement: Unimpaired Orientation: Person, Place, Time, Situation Obsessive Compulsive Thoughts/Behaviors: None  Cognitive Functioning Concentration: Fair Memory: Recent Intact, Remote Intact IQ: Average Insight: Fair Impulse Control: Fair Appetite: Fair Weight Loss: 0 Weight Gain: 0 Sleep: No Change Total Hours of Sleep: 6 Vegetative Symptoms: Decreased grooming  ADLScreening [facility] Assessment Services) Patient's cognitive ability adequate to safely complete daily  activities?: No Patient able to express need for assistance with ADLs?: Yes Independently performs ADLs?: No  Prior Inpatient Therapy Prior Inpatient Therapy: No Prior Therapy Dates: Unknown Prior Therapy Facilty/Provider(s): Unknown Reason for Treatment: NA  Prior Outpatient Therapy Prior Outpatient Therapy: Yes Prior Therapy Dates: Unknown Prior Therapy Facilty/Provider(s): [facility] Reason for Treatment: Depression  ADL Screening (condition at time of admission) Patient's  cognitive ability adequate to safely complete daily activities?: No Is the patient deaf or have difficulty hearing?: Yes Does the patient have difficulty seeing, even when wearing glasses/contacts?: Yes Does the patient have difficulty concentrating, remembering, or making decisions?: Yes Patient able to express need for assistance with ADLs?: Yes Does the patient have difficulty dressing or bathing?: Yes Independently performs ADLs?: No Communication: Needs assistance Is this a change from baseline?: Pre-admission baseline Dressing (OT): Needs assistance Is this a change from baseline?: Pre-admission baseline Grooming: Needs assistance Is this a change from baseline?: Pre-admission baseline Feeding: Needs assistance Is this a change from baseline?: Pre-admission baseline Bathing: Needs assistance Is this a change from baseline?: Pre-admission baseline Toileting: Needs assistance Is this a change from baseline?: Pre-admission baseline In/Out Bed: Independent Is this a change from baseline?: Change from baseline, expected to last <3 days Walks in Home: Independent Does the patient have difficulty walking or climbing stairs?: Yes Weakness of Legs: Both Weakness of Arms/Hands: None  Home Assistive Devices/Equipment Home Assistive Devices/Equipment: None  Therapy Consults (therapy consults require a physician order) PT Evaluation Needed: Yes (Comment) OT Evalulation Needed: No SLP Evaluation Needed:  No Abuse/Neglect Assessment (Assessment to be complete while patient is alone) Physical Abuse: Denies Verbal Abuse: Denies Sexual Abuse: Denies Exploitation of patient/patient's resources: Denies Self-Neglect: Denies Values / Beliefs Cultural Requests During Hospitalization: None Spiritual Requests During Hospitalization: None Consults Spiritual Care Consult Needed: No Social Work Consult Needed: Yes (Comment) (overdose lives alone) Regulatory affairs officer (For Healthcare) Does patient have an advance directive?: No Would patient like information on creating an advanced directive?: No - patient declined information Nutrition Screen- [facility] Adult/[facility] Patient's home diet:  (unknown) Have you recently lost weight without trying?: Yes If yes, how much weight have you lost?: Patient is unsure Have you been eating poorly because of a decreased appetite?: No (does not cook, orders [employer], or neice brings over food) Malnutrition Screening Tool Score: 2  Additional Information 1:1 In Past 12 Months?: No CIRT Risk: No Elopement Risk: No Does patient have medical clearance?: No     Disposition:  Disposition Initial Assessment Completed for this Encounter: Yes  Christohper Dube D 05/07/2014 3:45 PM

## 2014-05-07 NOTE — Progress Notes (Signed)
CRITICAL VALUE ALERT  Critical value received:  K+ 2.9  Date of notification:  05/07/14  Time of notification:  1130  Critical value read back: yes   Nurse who received alert:  Lucilla Lame   MD notified (1st page):  DonDiego  Time of first page:  59  MD notified (2nd page):  Time of second page:  Responding MD:  Cindie Laroche   Time MD responded:  1132  Orders received

## 2014-05-07 NOTE — Progress Notes (Signed)
Patient seen and discussed with NP Purcell Nails, I agree with her documentation. 68 yo female hx of EtOH abuse, COPD, depression, personality disorder admitted with AMS. She reportedly was found home altered surrounded by empty medication and EtOH bottles. Upon workup she was found to have an elevated tropoin to 0.72 (has not peaked), and cardiology was consulted.    UDS + benzos, CK 1549, K 3.9, AST 85, Cr 1.03, anion gap 24, WBC 23.4, Hgb 13.2, lactic acidosis, salicylates 28.7, Mg 2.4,  ABG 7.47/25/18 CXR right basilar atelectasis vs pneumonia CT head no acute process EKG SR, reported very long QTc 646 (by manual measurement in several leads QTc 480), no acute ischemic changes.    Elevated troponin likely demand ischemia in setting of multiple metabolic abnormalities including + gap metabolic acidosis with respiratory compensation, hypoxia, severe leukocytosis with possible pneoumonia as source based on xray. Evidence of rhabdo with elevated CKD suggesting both skeletal and myocardial stress. EKGs without evidence of ischemia. Reports some stomach and epigastric pain but no specific chest pain. She reports prevoius history of ulcers, also reports heavy BC powder use, also possibility for gastritis with heavy EtOH use.Will obtain echo to further evaluate for cardiac dysfunction. Continue ASA for now, cycle trops until peak obtained. Initial EKG with reported very long QTc, on manual measurement QTc 480, on repeat EKG QTc 450. No evidence of long QT.    Zandra Abts MD

## 2014-05-07 NOTE — Plan of Care (Signed)
Problem: Consults Goal: General Medical Patient Education See Patient Education Module for specific education. Outcome: Progressing Patient admitted with AMS after apparent intoxication and use of pills Goal: Skin Care Protocol Initiated - if Braden Score 18 or less If consults are not indicated, leave blank or document N/A Outcome: Progressing Multiple bruising and ecchymotic areas.  Per niece, patient has fallen multiple times at home.  Abrasion to right knee, bright red.  Sacrum purple and pink foam dressing applied  Problem: Phase I Progression Outcomes Goal: Pain controlled with appropriate interventions Outcome: Completed/Met Date Met:  05/07/14 Goal: OOB as tolerated unless otherwise ordered Outcome: Progressing Goal: Initial discharge plan identified Outcome: Progressing Goal: Hemodynamically stable Outcome: Completed/Met Date Met:  05/07/14

## 2014-05-07 NOTE — [facility] Counselor (Signed)
Counselor contacted [facility] to complete assessment. There is difficulty with connecting the tele-assess machines.  Lorenza Cambridge, [facility] Triage Specialist

## 2014-05-07 NOTE — Progress Notes (Signed)
Patient refusing to have new IV started.  Explained the importance of the IV and the antibiotics that she is to get.  Patient stated that she does not care that she doesn't need the antibiotics and that they are her arms and she is not going to be stuck for another IV.  On call MD notified.  Order to leave IV out and hold IV antibiotics overnight and let the primary doctor address in the AM.  Will continue to monitor the patient.

## 2014-05-07 NOTE — Progress Notes (Signed)
Inpatient Diabetes Program Recommendations  AACE/ADA: New Consensus Statement on Inpatient Glycemic Control (2013)  Target Ranges:  Prepandial:   less than 140 mg/dL      Peak postprandial:   less than 180 mg/dL (1-2 hours)      Critically ill patients:  140 - 180 mg/dL   Results for ARMIDA, VICKROY (MRN [id number]) as of 05/07/2014 09:50  Ref. Range 05/06/2014 13:02 05/06/2014 22:17 05/07/2014 01:54 05/07/2014 05:48  Glucose Latest Range: 70-99 mg/dL 135 (H) 193 (H) 141 (H) 177 (H)    Diabetes history: No Outpatient Diabetes medications: NA Current orders for Inpatient glycemic control: None  Inpatient Diabetes Program Recommendations Correction (SSI): Noted fasting glucose is 177 mg/dl this morning.  Please consider ordering CBGs with Novolog sensitive correction ACHS. HgbA1C: Please consider ordering an A1C to evaluate glycemic control over the past 2-3 months.  Thanks, Barnie Alderman, RN, MSN, CCRN, CDE Diabetes Coordinator Inpatient Diabetes Program [phone] (Team Pager) [phone] ([facility] office) [phone] [facility] office)

## 2014-05-08 DIAGNOSIS — I359 Nonrheumatic aortic valve disorder, unspecified: Secondary | ICD-10-CM

## 2014-05-08 DIAGNOSIS — F332 Major depressive disorder, recurrent severe without psychotic features: Secondary | ICD-10-CM

## 2014-05-08 DIAGNOSIS — I248 Other forms of acute ischemic heart disease: Secondary | ICD-10-CM

## 2014-05-08 LAB — LEGIONELLA ANTIGEN, URINE

## 2014-05-08 LAB — HEPATIC FUNCTION PANEL
ALBUMIN: 3.3 g/dL — AB (ref 3.5–5.2)
ALT: 42 U/L — AB (ref 0–35)
AST: 162 U/L — AB (ref 0–37)
Alkaline Phosphatase: 92 U/L (ref 39–117)
Bilirubin, Direct: <0.2 mg/dL (ref 0.0–0.3)
TOTAL PROTEIN: 6.8 g/dL (ref 6.0–8.3)
Total Bilirubin: 0.5 mg/dL (ref 0.3–1.2)

## 2014-05-08 LAB — GI PATHOGEN PANEL BY PCR, STOOL
C difficile toxin A/B: NEGATIVE
Campylobacter by PCR: NEGATIVE
Cryptosporidium by PCR: NEGATIVE
E coli (ETEC) LT/ST: NEGATIVE
E coli (STEC): NEGATIVE
E coli 0157 by PCR: NEGATIVE
G LAMBLIA BY PCR: NEGATIVE
NOROVIRUS G1/G2: NEGATIVE
ROTAVIRUS A BY PCR: NEGATIVE
SHIGELLA BY PCR: NEGATIVE
Salmonella by PCR: NEGATIVE

## 2014-05-08 LAB — BASIC METABOLIC PANEL
ANION GAP: 13 (ref 5–15)
BUN: 6 mg/dL (ref 6–23)
CALCIUM: 9.4 mg/dL (ref 8.4–10.5)
CO2: 29 mEq/L (ref 19–32)
Chloride: 106 mEq/L (ref 96–112)
Creatinine, Ser: 0.8 mg/dL (ref 0.50–1.10)
GFR, EST AFRICAN AMERICAN: 87 mL/min — AB (ref >90)
GFR, EST NON AFRICAN AMERICAN: 75 mL/min — AB (ref >90)
Glucose, Bld: 120 mg/dL — ABNORMAL HIGH (ref 70–99)
POTASSIUM: 3.2 meq/L — AB (ref 3.7–5.3)
SODIUM: 148 meq/L — AB (ref 137–147)

## 2014-05-08 LAB — CBC WITH DIFFERENTIAL/PLATELET
BASOS ABS: 0 10*3/uL (ref 0.0–0.1)
Basophils Relative: 0 % (ref 0–1)
EOS PCT: 0 % (ref 0–5)
Eosinophils Absolute: 0 10*3/uL (ref 0.0–0.7)
HCT: 37 % (ref 36.0–46.0)
Hemoglobin: 12.3 g/dL (ref 12.0–15.0)
Lymphocytes Relative: 20 % (ref 12–46)
Lymphs Abs: 3.2 10*3/uL (ref 0.7–4.0)
MCH: 31.7 pg (ref 26.0–34.0)
MCHC: 33.2 g/dL (ref 30.0–36.0)
MCV: 95.4 fL (ref 78.0–100.0)
Monocytes Absolute: 1 10*3/uL (ref 0.1–1.0)
Monocytes Relative: 6 % (ref 3–12)
NEUTROS ABS: 11.6 10*3/uL — AB (ref 1.7–7.7)
NEUTROS PCT: 74 % (ref 43–77)
Platelets: 144 10*3/uL — ABNORMAL LOW (ref 150–400)
RBC: 3.88 MIL/uL (ref 3.87–5.11)
RDW: 14.5 % (ref 11.5–15.5)
WBC: 15.8 10*3/uL — AB (ref 4.0–10.5)

## 2014-05-08 MED ORDER — ALPRAZOLAM 1 MG PO TABS
1.0000 mg | ORAL_TABLET | Freq: Once | ORAL | Status: AC
  Administered 2014-05-08: 1 mg via ORAL
  Filled 2014-05-08: qty 1

## 2014-05-08 MED ORDER — POTASSIUM CHLORIDE CRYS ER 20 MEQ PO TBCR
40.0000 meq | EXTENDED_RELEASE_TABLET | Freq: Once | ORAL | Status: AC
  Administered 2014-05-08: 40 meq via ORAL
  Filled 2014-05-08: qty 2

## 2014-05-08 MED ORDER — OXYCODONE-ACETAMINOPHEN 5-325 MG PO TABS
1.0000 | ORAL_TABLET | Freq: Once | ORAL | Status: AC
  Administered 2014-05-08: 1 via ORAL
  Filled 2014-05-08: qty 1

## 2014-05-08 MED ORDER — PRAVASTATIN SODIUM 40 MG PO TABS: 40.0000 mg | ORAL_TABLET | Freq: Every day | ORAL | Status: DC

## 2014-05-08 MED ORDER — OXYCODONE HCL 5 MG PO TABS
5.0000 mg | ORAL_TABLET | Freq: Once | ORAL | Status: AC
  Administered 2014-05-08: 5 mg via ORAL
  Filled 2014-05-08: qty 1

## 2014-05-08 MED ORDER — POTASSIUM CHLORIDE CRYS ER 20 MEQ PO TBCR: 20.0000 meq | EXTENDED_RELEASE_TABLET | Freq: Every day | ORAL | Status: DC

## 2014-05-08 MED ORDER — ASPIRIN EC 81 MG PO TBEC: 81.0000 mg | DELAYED_RELEASE_TABLET | Freq: Every day | ORAL | Status: DC

## 2014-05-08 MED ORDER — OXYCODONE-ACETAMINOPHEN 10-325 MG PO TABS: 1.0000 | ORAL_TABLET | Freq: Four times a day (QID) | ORAL | Status: DC | PRN

## 2014-05-08 MED ORDER — AZITHROMYCIN 250 MG PO TABS
250.0000 mg | ORAL_TABLET | Freq: Every day | ORAL | Status: DC
  Administered 2014-05-08: 250 mg via ORAL
  Filled 2014-05-08: qty 1

## 2014-05-08 NOTE — Progress Notes (Signed)
Nutrition Brief Note  Patient identified on the Malnutrition Screening Tool (MST) Report  Wt Readings from Last 15 Encounters:  05/07/14 145 lb 11.6 oz (66.1 kg)  07/03/12 125 lb (56.7 kg)  05/27/11 166 lb (75.297 kg)  11/25/10 170 lb 9.6 oz (77.384 kg)  06/16/09 148 lb 8 oz (67.359 kg)    Body mass index is 25.82 kg/(m^2). Patient meets criteria for overweight based on current BMI. She has 20# gain over past 20 months.  Current diet order is 2 gr sodium. Labs and medications reviewed. She is waiting placement in psych vs geriatric facility.  No nutrition interventions warranted at this time. If nutrition issues arise, please consult RD.  Colman Cater MS,RD,CSG,LDN Office: [phone] Pager: [phone]

## 2014-05-08 NOTE — Clinical Social Work Placement (Signed)
Clinical Social Work Department CLINICAL SOCIAL WORK PLACEMENT NOTE 05/08/2014  Patient:  Collar,Ashley Hall  Account Number:  [id number] Admit date:  05/06/2014  Clinical Social Worker:  Benay Pike, LCSW  Date/time:  05/08/2014 03:20 PM  Clinical Social Work is seeking post-discharge placement for this patient at the following level of care:   SKILLED NURSING   (*CSW will update this form in Epic as items are completed)   05/08/2014  Patient/family provided with [place] Department of Clinical Social Work's list of facilities offering this level of care within the geographic area requested by the patient (or if unable, by the patient's family).  05/08/2014  Patient/family informed of their freedom to choose among providers that offer the needed level of care, that participate in Medicare, Medicaid or managed care program needed by the patient, have an available bed and are willing to accept the patient.  05/08/2014  Patient/family informed of [facility]' ownership interest in [facility], as well as of the fact that they are under no obligation to receive care at this facility.  PASARR submitted to EDS on 05/08/2014 PASARR number received on 05/08/2014  FL2 transmitted to all facilities in geographic area requested by pt/family on  05/08/2014 FL2 transmitted to all facilities within larger geographic area on   Patient informed that his/her managed care company has contracts with or will negotiate with  certain facilities, including the following:     Patient/family informed of bed offers received:  05/08/2014 Patient chooses bed at [facility] Physician recommends and patient chooses bed at  [facility]  Patient to be transferred to [facility] on  05/08/2014 Patient to be transferred to facility by niece- Leann Patient and family notified of transfer on 05/08/2014 Name of family member notified:  niece- Marylin Crosby  The  following physician request were entered in Epic:   Additional Comments: 21 day pasarr.  Benay Pike, [place]

## 2014-05-08 NOTE — Discharge Summary (Signed)
Physician Discharge Summary  Ashley Hall OEV:[id number] DOB: 11-26-1945 DOA: 05/06/2014  PCP: Maricela Curet, MD  Admit date: 05/06/2014 Discharge date: 05/08/2014   Recommendations for Outpatient Follow-up:  She'll follow up in the skilled nursing facility for gait strengthening and ambulation Discharge Diagnoses:  Principal Problem:   Altered mental state Active Problems:   Essential hypertension   COPD (chronic obstructive pulmonary disease)   Tobacco abuse   ETOH abuse   Drug overdose   Elevated troponin   CAP (community acquired pneumonia)   Lactic acidosis   Leukocytosis   Severe depression   Insomnia   Hypothyroidism   GERD (gastroesophageal reflux disease)   Rhabdomyolysis   Physical deconditioning   Altered mental status   Aspiration pneumonia   Demand ischemia   Discharge Condition: Good  Filed Weights   05/06/14 1208 05/06/14 1725 05/07/14 0500  Weight: 125 lb (56.7 kg) 142 lb 13.7 oz (64.8 kg) 145 lb 11.6 oz (66.1 kg)    History of present illness:  She was admitted with altered sensorium due to ingested alcohol and benzo diazepam she was also found to have bilateral lower lobe atelectasis by chest x-ray and occasionally with vancomycin and Zosyn during her entire housein consultation by cardiology ejection fraction of 65% with no wall motion abnormalities noted  Hospital Course:  She is surgically cleared by psychiatry as well as cardiology for skilled nursing facility rehabilitation stay for strengthening and ambulation  Procedures:  None  Consultations:  Cardiology and psychiatry  Discharge Instructions  Discharge Instructions    Discharge instructions    Complete by:  As directed             Medication List    STOP taking these medications        albuterol (2.5 MG/3ML) 0.083% nebulizer solution  Commonly known as:  PROVENTIL     albuterol 108 (90 BASE) MCG/ACT inhaler  Commonly known as:  PROVENTIL HFA;VENTOLIN HFA       celecoxib 200 MG capsule  Commonly known as:  CELEBREX     cimetidine 400 MG tablet  Commonly known as:  TAGAMET     lidocaine 5 %  Commonly known as:  LIDODERM     mirtazapine 15 MG tablet  Commonly known as:  REMERON     Potassium 99 MG Tabs     QC LORATADINE ALLERGY RELIEF 10 MG tablet  Generic drug:  loratadine     SUPER B COMPLEX PO     vitamin A 10000 UNIT capsule     Vitamin D 1000 UNITS capsule     vitamin E 400 UNIT capsule      TAKE these medications        ALPRAZolam 1 MG tablet  Commonly known as:  XANAX  Take 1 mg by mouth 4 (four) times daily.     amLODipine 5 MG tablet  Commonly known as:  NORVASC  Take 5 mg by mouth daily.     DULoxetine 60 MG capsule  Commonly known as:  CYMBALTA  Take 60 mg by mouth daily.     ferrous gluconate 325 MG tablet  Commonly known as:  FERGON  Take 325 mg by mouth daily with breakfast.     fish oil-omega-3 fatty acids 1000 MG capsule  Take 2 g by mouth daily.     gabapentin 300 MG capsule  Commonly known as:  NEURONTIN  Take 300 mg by mouth Daily.     levothyroxine 50 MCG tablet  Commonly known as:  SYNTHROID, LEVOTHROID  Take 50 mcg by mouth daily.     metoprolol tartrate 25 MG tablet  Commonly known as:  LOPRESSOR  Take 37.5 mg by mouth 2 (two) times daily.     multivitamin capsule  Take 1 capsule by mouth daily.     oxyCODONE-acetaminophen 10-325 MG per tablet  Commonly known as:  PERCOCET  Take 1 tablet by mouth every 6 (six) hours as needed.     OYSTER SHELL CALCIUM 500 + D 500-125 MG-UNIT Tabs  Generic drug:  Calcium Carbonate-Vitamin D  Take 1 tablet by mouth daily.     pantoprazole 40 MG tablet  Commonly known as:  PROTONIX  Take 40 mg by mouth daily.     polyethylene glycol powder powder  Commonly known as:  GLYCOLAX/MIRALAX  Take 17 g by mouth daily.     potassium chloride SA 20 MEQ tablet  Commonly known as:  K-DUR,KLOR-CON  Take 1 tablet (20 mEq total) by mouth daily.  Start  taking on:  05/09/2014     pravastatin 40 MG tablet  Commonly known as:  PRAVACHOL  Take 1 tablet (40 mg total) by mouth at bedtime.     topiramate 100 MG tablet  Commonly known as:  TOPAMAX  Take 100 mg by mouth 2 (two) times daily.     traZODone 100 MG tablet  Commonly known as:  DESYREL  Take 100 mg by mouth at bedtime.     vitamin C 1000 MG tablet  Take 1,000 mg by mouth daily.       Allergies  Allergen Reactions  . Pregabalin     REACTION: "couldn't move"       Follow-up Information    Follow up with Jory Sims, NP In 1 month.   Specialty:  Nurse Practitioner   Contact information:   [address] [place] [address] [phone]        The results of significant diagnostics from this hospitalization (including imaging, microbiology, ancillary and laboratory) are listed below for reference.    Significant Diagnostic Studies: Ct Head Wo Contrast  05/06/2014   CLINICAL DATA:  Initial encounter for loss of consciousness this morning.  EXAM: CT HEAD WITHOUT CONTRAST  TECHNIQUE: Contiguous axial images were obtained from the base of the skull through the vertex without intravenous contrast.  COMPARISON:  09/16/2013.  FINDINGS: There is no evidence for acute hemorrhage, hydrocephalus, mass lesion, or abnormal extra-axial fluid collection. No definite CT evidence for acute infarction. Diffuse loss of parenchymal volume is consistent with atrophy. The visualized paranasal sinuses and mastoid air cells are clear.  IMPRESSION: No acute intracranial abnormality. Mild generalized atrophy is unchanged.   Electronically Signed   By: Misty Stanley M.D.   On: 05/06/2014 14:56   Dg Chest Port 1 View  05/06/2014   CLINICAL DATA:  Altered mental status.  EXAM: PORTABLE CHEST - 1 VIEW  COMPARISON:  09/16/2013  FINDINGS: New opacity at the right lung base could reflect atelectasis or infiltrate. Left lung is clear. Heart is normal size. No effusions or acute bony abnormality.   IMPRESSION: Right basilar atelectasis or infiltrate/pneumonia.   Electronically Signed   By: Rolm Baptise M.D.   On: 05/06/2014 13:55    Microbiology: Recent Results (from the past 240 hour(s))  Urine culture     Status: None   Collection Time: 05/06/14 12:25 PM  Result Value Ref Range Status   Specimen Description URINE, CATHETERIZED  Final  Special Requests NONE  Final   Culture  Setup Time   Final    05/07/2014 00:42 Performed at [place] Performed at [facility]   Final   Culture NO GROWTH Performed at [facility]   Final   Report Status 05/07/2014 FINAL  Final  MRSA PCR Screening     Status: Abnormal   Collection Time: 05/06/14  5:20 PM  Result Value Ref Range Status   MRSA by PCR POSITIVE (A) NEGATIVE Final    Comment:        The GeneXpert MRSA Assay (FDA approved for NASAL specimens only), is one component of a comprehensive MRSA colonization surveillance program. It is not intended to diagnose MRSA infection nor to guide or monitor treatment for MRSA infections. RESULT CALLED TO, READ BACK BY AND VERIFIED WITH: HAMMAOCK S AT 1954 ON [id number] BY FORSYTH K   Culture, blood (routine x 2) Call MD if unable to obtain prior to antibiotics being given     Status: None (Preliminary result)   Collection Time: 05/06/14  8:07 PM  Result Value Ref Range Status   Specimen Description BLOOD RIGHT HAND  Final   Special Requests   Final    BOTTLES DRAWN AEROBIC AND ANAEROBIC AEB 10CC ANA 5CC   Culture NO GROWTH 1 DAY  Final   Report Status PENDING  Incomplete  Culture, blood (routine x 2) Call MD if unable to obtain prior to antibiotics being given     Status: None (Preliminary result)   Collection Time: 05/06/14  8:07 PM  Result Value Ref Range Status   Specimen Description BLOOD LEFT HAND  Final   Special Requests BOTTLES DRAWN AEROBIC AND ANAEROBIC [place]  Final   Culture NO GROWTH 1 DAY  Final   Report Status  PENDING  Incomplete     Labs: Basic Metabolic Panel:  Recent Labs Lab 05/06/14 1534  05/07/14 0548 05/07/14 1040 05/07/14 1348 05/07/14 1757 05/08/14 0731  NA  --   < > 146 141 144 141 148*  K  --   < > 3.3* 2.9* 3.2* 3.2* 3.2*  CL  --   < > 104 100 103 102 106  CO2  --   < > 23 25 27 26 29   GLUCOSE  --   < > 177* 158* 156* 147* 120*  BUN  --   < > 17 12 10 8 6   CREATININE  --   < > 0.94 0.84 0.85 0.82 0.80  CALCIUM  --   < > 8.4 8.4 8.7 8.7 9.4  MG 2.4  --   --  2.1  --   --   --   PHOS 3.0  --   --   --   --   --   --   < > = values in this interval not displayed. Liver Function Tests:  Recent Labs Lab 05/07/14 0548 05/07/14 1040 05/07/14 1348 05/07/14 1757 05/08/14 0731  AST 143* 175* 195* 189* 162*  ALT 30 35 39* 39* 42*  ALKPHOS 79 81 85 85 92  BILITOT 0.3 0.4 0.4 0.5 0.5  PROT 6.4 6.4 6.7 6.5 6.8  ALBUMIN 3.1* 3.1* 3.3* 3.2* 3.3*   No results for input(s): LIPASE, AMYLASE in the last 168 hours. No results for input(s): AMMONIA in the last 168 hours. CBC:  Recent Labs Lab 05/06/14 1302 05/07/14 0548 05/08/14 0731  WBC 23.4* 25.1* 15.8*  NEUTROABS  20.0*  --  11.6*  HGB 13.2 11.9* 12.3  HCT 40.2 34.1* 37.0  MCV 94.4 93.2 95.4  PLT 186 156 144*   Cardiac Enzymes:  Recent Labs Lab 05/06/14 1302 05/06/14 1532 05/06/14 2217 05/07/14 0548 05/07/14 1040  CKTOTAL 1549*  --   --   --   --   TROPONINI 0.45* 0.56* 0.70* 0.72* 0.58*   BNP: BNP (last 3 results) No results for input(s): PROBNP in the last 8760 hours. CBG: No results for input(s): GLUCAP in the last 168 hours.     Signed:  Delyla Sandeen Jerilynn Mages  [facility] Pager: [phone] 05/08/2014, 1:10 PM

## 2014-05-08 NOTE — Progress Notes (Signed)
*  PRELIMINARY RESULTS* Echocardiogram 2D Echocardiogram has been performed.  Ashley Hall 05/08/2014, 11:34 AM

## 2014-05-08 NOTE — Progress Notes (Signed)
Notified Dr. Cindie Laroche of the patients home dose of her xanax and percocet.  He stated that he would see the patient.  He was currently on the floor. ---Late entry  1200 05/08/2014  Also I notified the MD that the patient has not had her IV antibiotics because she lost her access last night and refuse to be re-stuck.  New orders given and followed.

## 2014-05-08 NOTE — Progress Notes (Signed)
Consulting cardiologist: Carlyle Dolly MD Primary Cardiologist: Minus Breeding MD  Cardiology Specific Problem List: 1. Positive Troponin  2. Hypertension   Subjective:    States she is in pain, and complains of not getting appropriate coverage.   Objective:   Temp:  [98.1 F (36.7 C)-99.2 F (37.3 C)] 98.8 F (37.1 C) (12/11 0659) Pulse Rate:  [73-88] 73 (12/11 0659) Resp:  [19-20] 20 (12/11 0659) BP: (128-165)/(51-67) 165/65 mmHg (12/11 0659) SpO2:  [92 %-98 %] 92 % (12/11 0659) Last BM Date: 05/07/14  Filed Weights   05/06/14 1208 05/06/14 1725 05/07/14 0500  Weight: 125 lb (56.7 kg) 142 lb 13.7 oz (64.8 kg) 145 lb 11.6 oz (66.1 kg)    Intake/Output Summary (Last 24 hours) at 05/08/14 5009 Last data filed at 05/08/14 0700  Gross per 24 hour  Intake   2480 ml  Output   4100 ml  Net  -1620 ml    Telemetry: SR-ST   Exam:  General: No acute distress.  HEENT: Conjunctiva and lids normal, oropharynx clear.  Lungs: Bilateral crackles, no wheezes. Productive coughing  Cardiac: No elevated JVP or bruits. RRR, no gallop or rub.   Abdomen: Normoactive bowel sounds, nontender, nondistended.  Extremities: No pitting edema, distal pulses full.  Neuropsychiatric: Alert and oriented x3, tearful affect at time.   Lab Results:  Basic Metabolic Panel:  Recent Labs Lab 05/06/14 1534  05/07/14 1040 05/07/14 1348 05/07/14 1757 05/08/14 0731  NA  --   < > 141 144 141 148*  K  --   < > 2.9* 3.2* 3.2* 3.2*  CL  --   < > 100 103 102 106  CO2  --   < > 25 27 26 29   GLUCOSE  --   < > 158* 156* 147* 120*  BUN  --   < > 12 10 8 6   CREATININE  --   < > 0.84 0.85 0.82 0.80  CALCIUM  --   < > 8.4 8.7 8.7 9.4  MG 2.4  --  2.1  --   --   --   < > = values in this interval not displayed.  Liver Function Tests:  Recent Labs Lab 05/07/14 1348 05/07/14 1757 05/08/14 0731  AST 195* 189* 162*  ALT 39* 39* 42*  ALKPHOS 85 85 92  BILITOT 0.4 0.5 0.5  PROT 6.7  6.5 6.8  ALBUMIN 3.3* 3.2* 3.3*    CBC:  Recent Labs Lab 05/06/14 1302 05/07/14 0548 05/08/14 0731  WBC 23.4* 25.1* 15.8*  HGB 13.2 11.9* 12.3  HCT 40.2 34.1* 37.0  MCV 94.4 93.2 95.4  PLT 186 156 144*    Cardiac Enzymes:  Recent Labs Lab 05/06/14 1302  05/06/14 2217 05/07/14 0548 05/07/14 1040  CKTOTAL 1549*  --   --   --   --   TROPONINI 0.45*  < > 0.70* 0.72* 0.58*  < > = values in this interval not displayed.  BNP: No results for input(s): PROBNP in the last 8760 hours.  Coagulation:  Recent Labs Lab 05/06/14 1322  INR 1.20    Radiology: Ct Head Wo Contrast  05/06/2014   CLINICAL DATA:  Initial encounter for loss of consciousness this morning.  EXAM: CT HEAD WITHOUT CONTRAST  TECHNIQUE: Contiguous axial images were obtained from the base of the skull through the vertex without intravenous contrast.  COMPARISON:  09/16/2013.  FINDINGS: There is no evidence for acute hemorrhage, hydrocephalus, mass lesion, or abnormal extra-axial fluid  collection. No definite CT evidence for acute infarction. Diffuse loss of parenchymal volume is consistent with atrophy. The visualized paranasal sinuses and mastoid air cells are clear.  IMPRESSION: No acute intracranial abnormality. Mild generalized atrophy is unchanged.   Electronically Signed   By: Misty Stanley M.D.   On: 05/06/2014 14:56   Dg Chest Port 1 View  05/06/2014   CLINICAL DATA:  Altered mental status.  EXAM: PORTABLE CHEST - 1 VIEW  COMPARISON:  09/16/2013  FINDINGS: New opacity at the right lung base could reflect atelectasis or infiltrate. Left lung is clear. Heart is normal size. No effusions or acute bony abnormality.  IMPRESSION: Right basilar atelectasis or infiltrate/pneumonia.   Electronically Signed   By: Rolm Baptise M.D.   On: 05/06/2014 13:55     Medications:   Scheduled Medications: . amLODipine  5 mg Oral Daily  . antiseptic oral rinse  7 mL Mouth Rinse q12n4p  . aspirin  325 mg Oral Daily  .  azithromycin  500 mg Intravenous Q24H  . chlorhexidine  15 mL Mouth Rinse BID  . Chlorhexidine Gluconate Cloth  6 each Topical Q0600  . DULoxetine  60 mg Oral Daily  . gabapentin  300 mg Oral TID  . heparin  5,000 Units Subcutaneous 3 times per day  . levothyroxine  50 mcg Oral QAC breakfast  . metoprolol  5 mg Intravenous 4 times per day  . mirtazapine  15 mg Oral QHS  . mupirocin ointment  1 application Nasal BID  . pantoprazole  40 mg Oral Daily  . piperacillin-tazobactam (ZOSYN)  IV  3.375 g Intravenous Q8H  . pravastatin  40 mg Oral QHS  . senna  1 tablet Oral BID  . sodium chloride  1,000 mL Intravenous Once  . sodium chloride  3 mL Intravenous Q12H  . topiramate  100 mg Oral BID  . vancomycin  500 mg Intravenous Q12H    Infusions: . sodium chloride 150 mL/hr at 05/07/14 1302    PRN Medications: alum & mag hydroxide-simeth, lidocaine, LORazepam, ondansetron **OR** ondansetron (ZOFRAN) IV, polyethylene glycol, traMADol   Assessment and Plan:   1. Positive Troponin:  Trending down, no values ordered this am. She is without complaints of chest pain specifically, but does complaint of generalized pain. Still hypokalemic. Will place on daily dosing. Can consider OP stress test. Awaiting echo results.   2. Hypertension: Currently not well controlled but labile on review of trends. May be related to chronic pain, emotional instability. Reluctant to increase dose of amlodipine.    Phill Myron. Lawrence NP [facility]  05/08/2014, 9:23 AM   Patient seen and discussed with NP Purcell Nails, agree with her documentation above. Likely demand ischemia in setting of multiple metabolic abnormalities including + gap metabolic acidosis with respiratory compensation, hypoxia, severe leukocytosis with possible pneoumonia as source based on xray. Peak trop of 0.72 now trending down, EKG without evidence of ischemia, patient has not had any chest pain. If echo normal no further plans for cardiac testing,  f/u results today. BP somewhat up and down, if persistently elevated restart home beta blocker, room to titrate up norvasc as needed. If no significant abnormalities on echo will sign off inpatient care, no need for card outpatient f/u at this point.  Zandra Abts MD

## 2014-05-08 NOTE — Progress Notes (Signed)
Attempted to give report to [facility].  I was transferred to a extention, but no one answered.  I will try again shortly.  This is prior to the patient being discharged.

## 2014-05-08 NOTE — Clinical Social Work Note (Signed)
CSW presented bed offers and pt and niece, Leann choose [facility]. D/C today. Facility notified. Family to provide transport. D/C summary faxed. 30 day pasarr received and facility is aware.   Ashley Hall, [place]

## 2014-05-08 NOTE — Consult Note (Signed)
Telepsych Consultation   Reason for Consult:  Depression found unresponsive  Referring Physician:  Attending  Ashley Hall is an 68 y.o. female.  Assessment: AXIS I:  Major Depression, Recurrent severe AXIS II:  Deferred AXIS III:   Past Medical History  Diagnosis Date  . Arthritis   . Chronic back pain     Dr Merlene Laughter  . Chronic constipation   . S/P colonoscopy 12/28/05    Dr Catalina Antigua prep, lipoma left colon  . Sinus drainage   . Alcohol abuse, in remission   . COPD (chronic obstructive pulmonary disease)   . Sedative, hypnotic or anxiolytic dependence   . Neurotic depression   . Personality disorder   . ETOH abuse    AXIS IV:  housing problems, problems related to social environment and problems with primary support group AXIS V:  51-60 moderate symptoms  Plan:  Supportive therapy provided about ongoing stressors.  Subjective:   Ashley Hall is a 68 y.o. female patient who looks older than stated age.The Pt was found unresponsive at home surrounded by her medications. Pt was covered in urine and feces as well. The Pt was transported to [facility]. The Pt is stable and alert at this time.HPI:   The ED did not find pills in the Pt's system but alcohol was found in her system.   Patient reports long history of depression managed with Cymbalta 1m which patient feels has been ineffective in managing current symptoms. .  Patient endorses the following symptoms sadness, low energy, feelings of hopelessness, and difficulty sleeping.  Patient denies current suicidal ideation but does endorse a death wish related to chronic pain and loneliness.  States "I hurt so bad that life isn't worth it"  Patient denies that she has suicidal ideation or plan.  Endorses significant isolation secondary to recent loss of brother who passed earlier this year and ongoing estrangement from son who has placed a restraining order against patient to prevent her from contacting him.  Patient sole source of  emotional support [place] as she [place].  Pt admits to mixing her medication with pills and alcohol. Pt reports that she was sober for 10 years but she recently began drinking again. Patient reports that she calls friends to bring her alcohol and drinks 1/2 bottle of vodka every other day. Patient denies s/s of withdrawal.  The Pt has multiple physical health concerns and has been diagnosed with depression.  The Pt [place] [facility] to [place] home six hours a day to take care of her needs.  The Pt's niece Mrs. GArchie Balboareports that the Pt has difficulty taking care of herself when the nurses leave. According to [name] the Pt falls frequently at home and has numerous bruises from falling. The Pt's nurse is concerned about the Pt's wellbeing. The Pt reports that she is not SI/HI at this time.  Denies Auditory or Visual hallucinations.  Patient is homebound and requires in home assistance to complete adls and IADLs.  Patient admits that she has difficultly remembering to take medications and will miss doses.  Niece verbalizes concern that current level of home support is not sufficient to meet patient's current needs.  Patient is open to skilled nursing or assisted living which would provide the patient structure and routine as well as a social outlet to relieve loneliness.   HPI Elements:   Location:  generalized. Quality:  acute. Severity:  moderate. Timing:  ongoing. Duration:  exacerbation over past few weeks . Context:  loneliness, holiday stress.  Past Psychiatric History: Past Medical History  Diagnosis Date  . Arthritis   . Chronic back pain     Dr Merlene Laughter  . Chronic constipation   . S/P colonoscopy 12/28/05    Dr Catalina Antigua prep, lipoma left colon  . Sinus drainage   . Alcohol abuse, in remission   . COPD (chronic obstructive pulmonary disease)   . Sedative, hypnotic or anxiolytic dependence   . Neurotic depression   . Personality  disorder   . ETOH abuse     reports that she has quit smoking. Her smoking use included Cigarettes. She has a 72 pack-year smoking history. She does not have any smokeless tobacco history on file. She reports that she drinks alcohol. She reports that she does not use illicit drugs. Family History  Problem Relation Age of Onset  . Heart attack Mother     Deceased  . Diabetes Mother     Deceased  . Cancer Father     Deceased  . Schizophrenia Brother    Family History Substance Abuse: No Family Supports: No Living Arrangements: Alone Can pt return to current living arrangement?: Yes Allergies:   Allergies  Allergen Reactions  . Pregabalin     REACTION: "couldn't move"    ACT Assessment Complete:  Yes:    Educational Status    Risk to Self: Risk to self with the past 6 months Suicidal Ideation: No Suicidal Intent: No Is patient at risk for suicide?: No Suicidal Plan?: No Access to Means: No What has been your use of drugs/alcohol within the last 12 months?: Alcohol Previous Attempts/Gestures: Yes How many times?: 1 Other Self Harm Risks: Difficulty taking care of basic needs Triggers for Past Attempts: Other (Comment) (Lonely) Intentional Self Injurious Behavior: None Family Suicide History: Unknown Recent stressful life event(s): Other (Comment) (Lonely) Persecutory voices/beliefs?: No Depression: Yes Depression Symptoms: Tearfulness, Fatigue, Feeling worthless/self pity Substance abuse history and/or treatment for substance abuse?: Yes Suicide prevention information given to non-admitted patients: Not applicable  Risk to Others: Risk to Others within the past 6 months Homicidal Ideation: No Thoughts of Harm to Others: No Current Homicidal Intent: No Current Homicidal Plan: No Access to Homicidal Means: No Identified Victim: None History of harm to others?: No Assessment of Violence: None Noted Violent Behavior Description: None Does patient have access to  weapons?: No Criminal Charges Pending?: No Does patient have a court date: No  Abuse: Abuse/Neglect Assessment (Assessment to be complete while patient is alone) Physical Abuse: Denies Verbal Abuse: Denies Sexual Abuse: Denies Exploitation of patient/patient's resources: Denies Self-Neglect: Denies  Prior Inpatient Therapy: Prior Inpatient Therapy Prior Inpatient Therapy: No Prior Therapy Dates: Unknown Prior Therapy Facilty/Provider(s): Unknown Reason for Treatment: NA  Prior Outpatient Therapy: Prior Outpatient Therapy Prior Outpatient Therapy: Yes Prior Therapy Dates: Unknown Prior Therapy Facilty/Provider(s): [facility] Reason for Treatment: Depression  Additional Information: Additional Information 1:1 In Past 12 Months?: No CIRT Risk: No Elopement Risk: No Does patient have medical clearance?: No                  Objective: Blood pressure 165/65, pulse 73, temperature 98.8 F (37.1 C), temperature source Oral, resp. rate 20, height $RemoveBe'5\' 3"'[id number]$  (1.6 m), weight 66.1 kg (145 lb 11.6 oz), SpO2 92 %.Body mass index is 25.82 kg/(m^2). Results for orders placed or performed during the hospital encounter of 05/06/14 (from the past 72 hour(s))  Urinalysis, Routine w  reflex microscopic     Status: Abnormal   Collection Time: 05/06/14 12:25 PM  Result Value Ref Range   Color, Urine YELLOW YELLOW   APPearance CLEAR CLEAR   Specific Gravity, Urine 1.025 1.005 - 1.030   pH 5.5 5.0 - 8.0   Glucose, UA NEGATIVE NEGATIVE mg/dL   Hgb urine dipstick LARGE (A) NEGATIVE   Bilirubin Urine NEGATIVE NEGATIVE   Ketones, ur 40 (A) NEGATIVE mg/dL   Protein, ur NEGATIVE NEGATIVE mg/dL   Urobilinogen, UA 0.2 0.0 - 1.0 mg/dL   Nitrite NEGATIVE NEGATIVE   Leukocytes, UA NEGATIVE NEGATIVE  Drug screen panel, emergency     Status: Abnormal   Collection Time: 05/06/14 12:25 PM  Result Value Ref Range   Opiates NONE DETECTED NONE DETECTED   Cocaine NONE DETECTED NONE DETECTED    Benzodiazepines POSITIVE (A) NONE DETECTED   Amphetamines NONE DETECTED NONE DETECTED   Tetrahydrocannabinol NONE DETECTED NONE DETECTED   Barbiturates NONE DETECTED NONE DETECTED    Comment:        DRUG SCREEN FOR MEDICAL PURPOSES ONLY.  IF CONFIRMATION IS NEEDED FOR ANY PURPOSE, NOTIFY LAB WITHIN 5 DAYS.        LOWEST DETECTABLE LIMITS FOR URINE DRUG SCREEN Drug Class       Cutoff (ng/mL) Amphetamine      1000 Barbiturate      200 Benzodiazepine   811 Tricyclics       914 Opiates          300 Cocaine          300 THC              50   Urine culture     Status: None   Collection Time: 05/06/14 12:25 PM  Result Value Ref Range   Specimen Description URINE, CATHETERIZED    Special Requests NONE    Culture  Setup Time      05/07/2014 00:42 Performed at [facility] Performed at [facility]     Culture NO GROWTH Performed at [facility]     Report Status 05/07/2014 FINAL   Urine microscopic-add on     Status: None   Collection Time: 05/06/14 12:25 PM  Result Value Ref Range   Squamous Epithelial / LPF RARE RARE   RBC / HPF 3-6 <3 RBC/hpf  CBC with Differential     Status: Abnormal   Collection Time: 05/06/14  1:02 PM  Result Value Ref Range   WBC 23.4 (H) 4.0 - 10.5 K/uL   RBC 4.26 3.87 - 5.11 MIL/uL   Hemoglobin 13.2 12.0 - 15.0 g/dL   HCT 40.2 36.0 - 46.0 %   MCV 94.4 78.0 - 100.0 fL   MCH 31.0 26.0 - 34.0 pg   MCHC 32.8 30.0 - 36.0 g/dL   RDW 13.9 11.5 - 15.5 %   Platelets 186 150 - 400 K/uL   Neutrophils Relative % 86 (H) 43 - 77 %   Neutro Abs 20.0 (H) 1.7 - 7.7 K/uL   Lymphocytes Relative 8 (L) 12 - 46 %   Lymphs Abs 1.9 0.7 - 4.0 K/uL   Monocytes Relative 6 3 - 12 %   Monocytes Absolute 1.5 (H) 0.1 - 1.0 K/uL   Eosinophils Relative 0 0 - 5 %   Eosinophils Absolute 0.0 0.0 - 0.7 K/uL   Basophils Relative 0 0 - 1 %   Basophils Absolute 0.0 0.0 - 0.1  K/uL  Comprehensive metabolic panel     Status:  Abnormal   Collection Time: 05/06/14  1:02 PM  Result Value Ref Range   Sodium 146 137 - 147 mEq/L   Potassium 3.9 3.7 - 5.3 mEq/L   Chloride 102 96 - 112 mEq/L   CO2 20 19 - 32 mEq/L   Glucose, Bld 135 (H) 70 - 99 mg/dL   BUN 23 6 - 23 mg/dL   Creatinine, Ser 1.03 0.50 - 1.10 mg/dL   Calcium 8.9 8.4 - 10.5 mg/dL   Total Protein 7.3 6.0 - 8.3 g/dL   Albumin 3.8 3.5 - 5.2 g/dL   AST 85 (H) 0 - 37 U/L   ALT 28 0 - 35 U/L   Alkaline Phosphatase 94 39 - 117 U/L   Total Bilirubin 0.2 (L) 0.3 - 1.2 mg/dL   GFR calc non Af Amer 55 (L) >90 mL/min   GFR calc Af Amer 64 (L) >90 mL/min    Comment: (NOTE) The eGFR has been calculated using the CKD EPI equation. This calculation has not been validated in all clinical situations. eGFR's persistently <90 mL/min signify possible Chronic Kidney Disease.    Anion gap 24 (H) 5 - 15  Ethanol     Status: None   Collection Time: 05/06/14  1:02 PM  Result Value Ref Range   Alcohol, Ethyl (B) <11 0 - 11 mg/dL    Comment:        LOWEST DETECTABLE LIMIT FOR SERUM ALCOHOL IS 11 mg/dL FOR MEDICAL PURPOSES ONLY   Acetaminophen level     Status: None   Collection Time: 05/06/14  1:02 PM  Result Value Ref Range   Acetaminophen (Tylenol), Serum <15.0 10 - 30 ug/mL    Comment:        THERAPEUTIC CONCENTRATIONS VARY SIGNIFICANTLY. A RANGE OF 10-30 ug/mL MAY BE AN EFFECTIVE CONCENTRATION FOR MANY PATIENTS. HOWEVER, SOME ARE BEST TREATED AT CONCENTRATIONS OUTSIDE THIS RANGE. ACETAMINOPHEN CONCENTRATIONS >150 ug/mL AT 4 HOURS AFTER INGESTION AND >50 ug/mL AT 12 HOURS AFTER INGESTION ARE OFTEN ASSOCIATED WITH TOXIC REACTIONS.   Troponin I     Status: Abnormal   Collection Time: 05/06/14  1:02 PM  Result Value Ref Range   Troponin I 0.45 (HH) <0.30 ng/mL    Comment:        Due to the release kinetics of cTnI, a negative result within the first hours of the onset of symptoms does not rule out myocardial infarction with certainty. If myocardial  infarction is still suspected, repeat the test at appropriate intervals. CRITICAL RESULT CALLED TO, READ BACK BY AND VERIFIED WITH: CRUISE,J. AT 1416 ON 05/06/2014 BY BAUGHAM,M.   CK     Status: Abnormal   Collection Time: 05/06/14  1:02 PM  Result Value Ref Range   Total CK 1549 (H) 7 - 177 U/L  Lactic acid, plasma     Status: Abnormal   Collection Time: 05/06/14  1:10 PM  Result Value Ref Range   Lactic Acid, Venous 2.7 (H) 0.5 - 2.2 mmol/L  Salicylate level     Status: Abnormal   Collection Time: 05/06/14  1:22 PM  Result Value Ref Range   Salicylate Lvl 29.2 (H) 2.8 - 20.0 mg/dL  Protime-INR     Status: Abnormal   Collection Time: 05/06/14  1:22 PM  Result Value Ref Range   Prothrombin Time 15.3 (H) 11.6 - 15.2 seconds   INR 1.20 0.00 - 1.49  Troponin I  Status: Abnormal   Collection Time: 05/06/14  3:32 PM  Result Value Ref Range   Troponin I 0.56 (HH) <0.30 ng/mL    Comment:        Due to the release kinetics of cTnI, a negative result within the first hours of the onset of symptoms does not rule out myocardial infarction with certainty. If myocardial infarction is still suspected, repeat the test at appropriate intervals. CRITICAL VALUE NOTED.  VALUE IS CONSISTENT WITH PREVIOUSLY REPORTED AND CALLED VALUE.   Salicylate level     Status: Abnormal   Collection Time: 05/06/14  3:32 PM  Result Value Ref Range   Salicylate Lvl 27.7 (H) 2.8 - 20.0 mg/dL  Magnesium     Status: None   Collection Time: 05/06/14  3:34 PM  Result Value Ref Range   Magnesium 2.4 1.5 - 2.5 mg/dL  Phosphorus     Status: None   Collection Time: 05/06/14  3:34 PM  Result Value Ref Range   Phosphorus 3.0 2.3 - 4.6 mg/dL  Occult blood, poc device     Status: Abnormal   Collection Time: 05/06/14  4:24 PM  Result Value Ref Range   Fecal Occult Bld POSITIVE (A) NEGATIVE  MRSA PCR Screening     Status: Abnormal   Collection Time: 05/06/14  5:20 PM  Result Value Ref Range   MRSA by PCR  POSITIVE (A) NEGATIVE    Comment:        The GeneXpert MRSA Assay (FDA approved for NASAL specimens only), is one component of a comprehensive MRSA colonization surveillance program. It is not intended to diagnose MRSA infection nor to guide or monitor treatment for MRSA infections. RESULT CALLED TO, READ BACK BY AND VERIFIED WITH: HAMMAOCK S AT 1954 ON [id number] BY FORSYTH K   Blood gas, arterial     Status: Abnormal   Collection Time: 05/06/14  5:34 PM  Result Value Ref Range   FIO2 55.00 %   Delivery systems OXYGEN MASK    pH, Arterial 7.471 (H) 7.350 - 7.450   pCO2 arterial 24.9 (L) 35.0 - 45.0 mmHg   pO2, Arterial 88.9 80.0 - 100.0 mmHg   Bicarbonate 18.0 (L) 20.0 - 24.0 mEq/L   TCO2 15.9 0 - 100 mmol/L   Acid-base deficit 5.0 (H) 0.0 - 2.0 mmol/L   O2 Saturation 96.5 %   Patient temperature 37.0    Collection site RIGHT BRACHIAL    Drawn by COLLECTED BY RT    Sample type ARTERIAL    Allens test (pass/fail) NOT INDICATED (A) PASS  Culture, blood (routine x 2) Call MD if unable to obtain prior to antibiotics being given     Status: None (Preliminary result)   Collection Time: 05/06/14  8:07 PM  Result Value Ref Range   Specimen Description BLOOD RIGHT HAND    Special Requests      BOTTLES DRAWN AEROBIC AND ANAEROBIC AEB 10CC ANA 5CC   Culture NO GROWTH 1 DAY    Report Status PENDING   Culture, blood (routine x 2) Call MD if unable to obtain prior to antibiotics being given     Status: None (Preliminary result)   Collection Time: 05/06/14  8:07 PM  Result Value Ref Range   Specimen Description BLOOD LEFT HAND    Special Requests BOTTLES DRAWN AEROBIC AND ANAEROBIC 6CC EACH    Culture NO GROWTH 1 DAY    Report Status PENDING   HIV antibody     Status: None  Collection Time: 05/06/14  8:07 PM  Result Value Ref Range   HIV 1&2 Ab, 4th Generation NONREACTIVE NONREACTIVE    Comment: (NOTE) A NONREACTIVE HIV Ag/Ab result does not exclude HIV infection since the time  frame for seroconversion is variable. If acute HIV infection is suspected, a HIV-1 RNA Qualitative TMA test is recommended. HIV-1/2 Antibody Diff         Not indicated. HIV-1 RNA, Qual TMA           Not indicated. PLEASE NOTE: This information has been disclosed to you from records whose confidentiality may be protected by state law. If your state requires such protection, then the state law prohibits you from making any further disclosure of the information without the specific written consent of the person to whom it pertains, or as otherwise permitted by law. A general authorization for the release of medical or other information is NOT sufficient for this purpose. The performance of this assay has not been clinically validated in patients less than 1 years old. Performed at [facility]   Strep pneumoniae urinary antigen     Status: None   Collection Time: 05/06/14 10:15 PM  Result Value Ref Range   Strep Pneumo Urinary Antigen NEGATIVE NEGATIVE    Comment:        Infection due to S. pneumoniae cannot be absolutely ruled out since the antigen present may be below the detection limit of the test. PERFORMED AT [facility] Performed at [facility]   Troponin I (q 6hr x 3)     Status: Abnormal   Collection Time: 05/06/14 10:17 PM  Result Value Ref Range   Troponin I 0.70 (HH) <0.30 ng/mL    Comment:        Due to the release kinetics of cTnI, a negative result within the first hours of the onset of symptoms does not rule out myocardial infarction with certainty. If myocardial infarction is still suspected, repeat the test at appropriate intervals. RESULT REPEATED AND VERIFIED CRITICAL RESULT CALLED TO, READ BACK BY AND VERIFIED WITH: BREWER,D ON 05/06/2014 AT 2300 BY ISLEY,B   Comprehensive metabolic panel     Status: Abnormal   Collection Time: 05/06/14 10:17 PM  Result Value Ref Range   Sodium 146 137 - 147 mEq/L   Potassium 2.8 (LL) 3.7 - 5.3  mEq/L    Comment: RESULT REPEATED AND VERIFIED CRITICAL RESULT CALLED TO, READ BACK BY AND VERIFIED WITH: BREWER,D AT 2300 ON 05/06/2014 BY ISLEY,B DELTA CHECK NOTED    Chloride 103 96 - 112 mEq/L   CO2 21 19 - 32 mEq/L   Glucose, Bld 193 (H) 70 - 99 mg/dL   BUN 21 6 - 23 mg/dL   Creatinine, Ser 3.11 0.50 - 1.10 mg/dL   Calcium 8.2 (L) 8.4 - 10.5 mg/dL   Total Protein 6.5 6.0 - 8.3 g/dL   Albumin 3.2 (L) 3.5 - 5.2 g/dL   AST 216 (H) 0 - 37 U/L   ALT 27 0 - 35 U/L   Alkaline Phosphatase 81 39 - 117 U/L   Total Bilirubin 0.1 (L) 0.3 - 1.2 mg/dL   GFR calc non Af Amer 59 (L) >90 mL/min   GFR calc Af Amer 68 (L) >90 mL/min    Comment: (NOTE) The eGFR has been calculated using the CKD EPI equation. This calculation has not been validated in all clinical situations. eGFR's persistently <90 mL/min signify possible Chronic Kidney Disease.    Anion gap  22 (H) 5 - 15  Comprehensive metabolic panel     Status: Abnormal   Collection Time: 05/07/14  1:54 AM  Result Value Ref Range   Sodium 145 137 - 147 mEq/L   Potassium 2.9 (LL) 3.7 - 5.3 mEq/L    Comment: DELTA CHECK NOTED HAMMOCK S AT 0225 ON [id number] BY FORSYTH K    Chloride 102 96 - 112 mEq/L   CO2 24 19 - 32 mEq/L   Glucose, Bld 141 (H) 70 - 99 mg/dL   BUN 18 6 - 23 mg/dL   Creatinine, Ser 0.98 0.50 - 1.10 mg/dL   Calcium 8.4 8.4 - 10.5 mg/dL   Total Protein 6.8 6.0 - 8.3 g/dL   Albumin 3.4 (L) 3.5 - 5.2 g/dL   AST 135 (H) 0 - 37 U/L   ALT 30 0 - 35 U/L   Alkaline Phosphatase 84 39 - 117 U/L   Total Bilirubin 0.2 (L) 0.3 - 1.2 mg/dL   GFR calc non Af Amer 59 (L) >90 mL/min   GFR calc Af Amer 68 (L) >90 mL/min    Comment: (NOTE) The eGFR has been calculated using the CKD EPI equation. This calculation has not been validated in all clinical situations. eGFR's persistently <90 mL/min signify possible Chronic Kidney Disease.    Anion gap 19 (H) 5 - 15  Comprehensive metabolic panel     Status: Abnormal   Collection Time:  05/07/14  5:48 AM  Result Value Ref Range   Sodium 146 137 - 147 mEq/L   Potassium 3.3 (L) 3.7 - 5.3 mEq/L   Chloride 104 96 - 112 mEq/L   CO2 23 19 - 32 mEq/L   Glucose, Bld 177 (H) 70 - 99 mg/dL   BUN 17 6 - 23 mg/dL   Creatinine, Ser 0.94 0.50 - 1.10 mg/dL   Calcium 8.4 8.4 - 10.5 mg/dL   Total Protein 6.4 6.0 - 8.3 g/dL   Albumin 3.1 (L) 3.5 - 5.2 g/dL   AST 143 (H) 0 - 37 U/L   ALT 30 0 - 35 U/L   Alkaline Phosphatase 79 39 - 117 U/L   Total Bilirubin 0.3 0.3 - 1.2 mg/dL   GFR calc non Af Amer 62 (L) >90 mL/min   GFR calc Af Amer 72 (L) >90 mL/min    Comment: (NOTE) The eGFR has been calculated using the CKD EPI equation. This calculation has not been validated in all clinical situations. eGFR's persistently <90 mL/min signify possible Chronic Kidney Disease.    Anion gap 19 (H) 5 - 15  CBC     Status: Abnormal   Collection Time: 05/07/14  5:48 AM  Result Value Ref Range   WBC 25.1 (H) 4.0 - 10.5 K/uL   RBC 3.66 (L) 3.87 - 5.11 MIL/uL   Hemoglobin 11.9 (L) 12.0 - 15.0 g/dL   HCT 34.1 (L) 36.0 - 46.0 %   MCV 93.2 78.0 - 100.0 fL   MCH 32.5 26.0 - 34.0 pg   MCHC 34.9 30.0 - 36.0 g/dL   RDW 14.3 11.5 - 15.5 %   Platelets 156 150 - 400 K/uL  Troponin I (q 6hr x 3)     Status: Abnormal   Collection Time: 05/07/14  5:48 AM  Result Value Ref Range   Troponin I 0.72 (HH) <0.30 ng/mL    Comment: CRITICAL RESULT CALLED TO, READ BACK BY AND VERIFIED WITH: BREWER,D AT 6:25AM ON 05/07/14 BY Derrill Memo  Due to the release kinetics of cTnI, a negative result within the first hours of the onset of symptoms does not rule out myocardial infarction with certainty. If myocardial infarction is still suspected, repeat the test at appropriate intervals.   Comprehensive metabolic panel     Status: Abnormal   Collection Time: 05/07/14 10:40 AM  Result Value Ref Range   Sodium 141 137 - 147 mEq/L   Potassium 2.9 (LL) 3.7 - 5.3 mEq/L    Comment: CRITICAL RESULT CALLED TO,  READ BACK BY AND VERIFIED WITH: MCDANIEL,M AT 11:30AM ON 05/07/14 BY FESTERMAN,C    Chloride 100 96 - 112 mEq/L   CO2 25 19 - 32 mEq/L   Glucose, Bld 158 (H) 70 - 99 mg/dL   BUN 12 6 - 23 mg/dL   Creatinine, Ser 0.84 0.50 - 1.10 mg/dL   Calcium 8.4 8.4 - 10.5 mg/dL   Total Protein 6.4 6.0 - 8.3 g/dL   Albumin 3.1 (L) 3.5 - 5.2 g/dL   AST 175 (H) 0 - 37 U/L   ALT 35 0 - 35 U/L   Alkaline Phosphatase 81 39 - 117 U/L   Total Bilirubin 0.4 0.3 - 1.2 mg/dL   GFR calc non Af Amer 71 (L) >90 mL/min   GFR calc Af Amer 82 (L) >90 mL/min    Comment: (NOTE) The eGFR has been calculated using the CKD EPI equation. This calculation has not been validated in all clinical situations. eGFR's persistently <90 mL/min signify possible Chronic Kidney Disease.    Anion gap 16 (H) 5 - 15  Troponin I (q 6hr x 3)     Status: Abnormal   Collection Time: 05/07/14 10:40 AM  Result Value Ref Range   Troponin I 0.58 (HH) <0.30 ng/mL    Comment: CRITICAL VALUE NOTED.  VALUE IS CONSISTENT WITH PREVIOUSLY REPORTED AND CALLED VALUE.        Due to the release kinetics of cTnI, a negative result within the first hours of the onset of symptoms does not rule out myocardial infarction with certainty. If myocardial infarction is still suspected, repeat the test at appropriate intervals.   Magnesium     Status: None   Collection Time: 05/07/14 10:40 AM  Result Value Ref Range   Magnesium 2.1 1.5 - 2.5 mg/dL  Comprehensive metabolic panel     Status: Abnormal   Collection Time: 05/07/14  1:48 PM  Result Value Ref Range   Sodium 144 137 - 147 mEq/L   Potassium 3.2 (L) 3.7 - 5.3 mEq/L   Chloride 103 96 - 112 mEq/L   CO2 27 19 - 32 mEq/L   Glucose, Bld 156 (H) 70 - 99 mg/dL   BUN 10 6 - 23 mg/dL   Creatinine, Ser 0.85 0.50 - 1.10 mg/dL   Calcium 8.7 8.4 - 10.5 mg/dL   Total Protein 6.7 6.0 - 8.3 g/dL   Albumin 3.3 (L) 3.5 - 5.2 g/dL   AST 195 (H) 0 - 37 U/L   ALT 39 (H) 0 - 35 U/L   Alkaline Phosphatase  85 39 - 117 U/L   Total Bilirubin 0.4 0.3 - 1.2 mg/dL   GFR calc non Af Amer 70 (L) >90 mL/min   GFR calc Af Amer 81 (L) >90 mL/min    Comment: (NOTE) The eGFR has been calculated using the CKD EPI equation. This calculation has not been validated in all clinical situations. eGFR's persistently <90 mL/min signify possible Chronic Kidney Disease.  Anion gap 14 5 - 15  Comprehensive metabolic panel     Status: Abnormal   Collection Time: 05/07/14  5:57 PM  Result Value Ref Range   Sodium 141 137 - 147 mEq/L   Potassium 3.2 (L) 3.7 - 5.3 mEq/L   Chloride 102 96 - 112 mEq/L   CO2 26 19 - 32 mEq/L   Glucose, Bld 147 (H) 70 - 99 mg/dL   BUN 8 6 - 23 mg/dL   Creatinine, Ser 0.82 0.50 - 1.10 mg/dL   Calcium 8.7 8.4 - 10.5 mg/dL   Total Protein 6.5 6.0 - 8.3 g/dL   Albumin 3.2 (L) 3.5 - 5.2 g/dL   AST 189 (H) 0 - 37 U/L   ALT 39 (H) 0 - 35 U/L   Alkaline Phosphatase 85 39 - 117 U/L   Total Bilirubin 0.5 0.3 - 1.2 mg/dL   GFR calc non Af Amer 73 (L) >90 mL/min   GFR calc Af Amer 85 (L) >90 mL/min    Comment: (NOTE) The eGFR has been calculated using the CKD EPI equation. This calculation has not been validated in all clinical situations. eGFR's persistently <90 mL/min signify possible Chronic Kidney Disease.    Anion gap 13 5 - 15  Basic metabolic panel     Status: Abnormal   Collection Time: 05/08/14  7:31 AM  Result Value Ref Range   Sodium 148 (H) 137 - 147 mEq/L   Potassium 3.2 (L) 3.7 - 5.3 mEq/L   Chloride 106 96 - 112 mEq/L   CO2 29 19 - 32 mEq/L   Glucose, Bld 120 (H) 70 - 99 mg/dL   BUN 6 6 - 23 mg/dL   Creatinine, Ser 0.80 0.50 - 1.10 mg/dL   Calcium 9.4 8.4 - 10.5 mg/dL   GFR calc non Af Amer 75 (L) >90 mL/min   GFR calc Af Amer 87 (L) >90 mL/min    Comment: (NOTE) The eGFR has been calculated using the CKD EPI equation. This calculation has not been validated in all clinical situations. eGFR's persistently <90 mL/min signify possible Chronic Kidney Disease.     Anion gap 13 5 - 15  CBC with Differential     Status: Abnormal   Collection Time: 05/08/14  7:31 AM  Result Value Ref Range   WBC 15.8 (H) 4.0 - 10.5 K/uL   RBC 3.88 3.87 - 5.11 MIL/uL   Hemoglobin 12.3 12.0 - 15.0 g/dL   HCT 37.0 36.0 - 46.0 %   MCV 95.4 78.0 - 100.0 fL   MCH 31.7 26.0 - 34.0 pg   MCHC 33.2 30.0 - 36.0 g/dL   RDW 14.5 11.5 - 15.5 %   Platelets 144 (L) 150 - 400 K/uL   Neutrophils Relative % 74 43 - 77 %   Neutro Abs 11.6 (H) 1.7 - 7.7 K/uL   Lymphocytes Relative 20 12 - 46 %   Lymphs Abs 3.2 0.7 - 4.0 K/uL   Monocytes Relative 6 3 - 12 %   Monocytes Absolute 1.0 0.1 - 1.0 K/uL   Eosinophils Relative 0 0 - 5 %   Eosinophils Absolute 0.0 0.0 - 0.7 K/uL   Basophils Relative 0 0 - 1 %   Basophils Absolute 0.0 0.0 - 0.1 K/uL  Hepatic function panel     Status: Abnormal   Collection Time: 05/08/14  7:31 AM  Result Value Ref Range   Total Protein 6.8 6.0 - 8.3 g/dL   Albumin 3.3 (L) 3.5 -  5.2 g/dL   AST 162 (H) 0 - 37 U/L   ALT 42 (H) 0 - 35 U/L   Alkaline Phosphatase 92 39 - 117 U/L   Total Bilirubin 0.5 0.3 - 1.2 mg/dL   Bilirubin, Direct <0.2 0.0 - 0.3 mg/dL   Indirect Bilirubin NOT CALCULATED 0.3 - 0.9 mg/dL   Labs are reviewed and are pertinent for medical issues being treated.  Current Facility-Administered Medications  Medication Dose Route Frequency Provider Last Rate Last Dose  . 0.9 %  sodium chloride infusion   Intravenous Continuous Waldemar Dickens, MD 150 mL/hr at 05/07/14 1302    . alum & mag hydroxide-simeth (MAALOX/MYLANTA) 200-200-20 MG/5ML suspension 30 mL  30 mL Oral Q6H PRN Phillips Grout, MD   30 mL at 05/07/14 0601  . amLODipine (NORVASC) tablet 5 mg  5 mg Oral Daily Waldemar Dickens, MD   5 mg at 05/08/14 0254  . antiseptic oral rinse (CPC / CETYLPYRIDINIUM CHLORIDE 0.05%) solution 7 mL  7 mL Mouth Rinse q12n4p Waldemar Dickens, MD   7 mL at 05/07/14 1600  . [START ON 05/09/2014] aspirin EC tablet 81 mg  81 mg Oral Daily Arnoldo Lenis, MD      . azithromycin (ZITHROMAX) 500 mg in dextrose 5 % 250 mL IVPB  500 mg Intravenous Q24H Waldemar Dickens, MD   500 mg at 05/06/14 2053  . chlorhexidine (PERIDEX) 0.12 % solution 15 mL  15 mL Mouth Rinse BID Waldemar Dickens, MD   15 mL at 05/08/14 0806  . Chlorhexidine Gluconate Cloth 2 % PADS 6 each  6 each Topical Q0600 Waldemar Dickens, MD   6 each at 05/08/14 [phone]  . DULoxetine (CYMBALTA) DR capsule 60 mg  60 mg Oral Daily Waldemar Dickens, MD   60 mg at 05/08/14 2376  . gabapentin (NEURONTIN) capsule 300 mg  300 mg Oral TID Waldemar Dickens, MD   300 mg at 05/08/14 2831  . heparin injection 5,000 Units  5,000 Units Subcutaneous 3 times per day Waldemar Dickens, MD   5,000 Units at 05/08/14 [phone]  . levothyroxine (SYNTHROID, LEVOTHROID) tablet 50 mcg  50 mcg Oral QAC breakfast Waldemar Dickens, MD   50 mcg at 05/08/14 1607  . lidocaine (LIDODERM) 5 % 1 patch  1 patch Transdermal Daily PRN Waldemar Dickens, MD      . LORazepam (ATIVAN) tablet 0.5 mg  0.5 mg Oral Q6H PRN Maricela Curet, MD   0.5 mg at 05/08/14 0806  . metoprolol (LOPRESSOR) injection 5 mg  5 mg Intravenous 4 times per day Gardiner Barefoot, NP   5 mg at 05/07/14 1659  . mirtazapine (REMERON) tablet 15 mg  15 mg Oral QHS Waldemar Dickens, MD   15 mg at 05/07/14 2226  . mupirocin ointment (BACTROBAN) 2 % 1 application  1 application Nasal BID Waldemar Dickens, MD   1 application at 37/10/62 0809  . ondansetron (ZOFRAN) tablet 4 mg  4 mg Oral Q6H PRN Waldemar Dickens, MD       Or  . ondansetron [facility]) injection 4 mg  4 mg Intravenous Q6H PRN Waldemar Dickens, MD      . pantoprazole (PROTONIX) EC tablet 40 mg  40 mg Oral Daily Waldemar Dickens, MD   40 mg at 05/08/14 6948  . piperacillin-tazobactam (ZOSYN) IVPB 3.375 g  3.375 g Intravenous Q8H Lavonia Drafts Lilliston, RPH   3.375 g  at 05/07/14 1703  . polyethylene glycol (MIRALAX / GLYCOLAX) packet 17 g  17 g Oral Daily PRN Waldemar Dickens, MD      . potassium chloride  SA (K-DUR,KLOR-CON) CR tablet 40 mEq  40 mEq Oral Once Lendon Colonel, NP       Followed by  . [START ON 05/09/2014] potassium chloride SA (K-DUR,KLOR-CON) CR tablet 20 mEq  20 mEq Oral Daily Lendon Colonel, NP      . pravastatin (PRAVACHOL) tablet 40 mg  40 mg Oral QHS Waldemar Dickens, MD   40 mg at 05/07/14 2227  . senna (SENOKOT) tablet 8.6 mg  1 tablet Oral BID Waldemar Dickens, MD   8.6 mg at 05/08/14 0807  . sodium chloride 0.9 % bolus 1,000 mL  1,000 mL Intravenous Once Nat Christen, MD      . sodium chloride 0.9 % injection 3 mL  3 mL Intravenous Q12H Waldemar Dickens, MD   3 mL at 05/07/14 1017  . topiramate (TOPAMAX) tablet 100 mg  100 mg Oral BID Waldemar Dickens, MD   100 mg at 05/08/14 9030  . traMADol (ULTRAM) tablet 50 mg  50 mg Oral BID PRN Maricela Curet, MD   50 mg at 05/08/14 0946  . vancomycin (VANCOCIN) 500 mg in sodium chloride 0.9 % 100 mL IVPB  500 mg Intravenous Q12H Waldemar Dickens, MD   500 mg at 05/07/14 0900    Psychiatric Specialty Exam:     Blood pressure 165/65, pulse 73, temperature 98.8 F (37.1 C), temperature source Oral, resp. rate 20, height 5' 3"  (1.6 m), weight 66.1 kg (145 lb 11.6 oz), SpO2 92 %.Body mass index is 25.82 kg/(m^2).  General Appearance: Casual  Eye Contact::  Good  Speech:  Clear and Coherent and Normal Rate  Volume:  Normal  Mood:  Anxious and Depressed  Affect:  Congruent  Thought Process:  Circumstantial, Coherent and Linear  Orientation:  Full (Time, Place, and Person)  Thought Content:  WDL  Suicidal Thoughts:  No  Homicidal Thoughts:  No  Memory:  Immediate;   Fair Recent;   Fair Remote;   Fair  Judgement:  Fair  Insight:  Fair  Psychomotor Activity:  Normal  Concentration:  Fair  Recall:  Fair  Akathisia:  No  Handed:  Right  AIMS (if indicated):     Assets:  Communication Skills Desire for Improvement Housing Leisure Time  Sleep:      Treatment Plan Summary: Patient is not suicidal or homicidal and is  not an imminent risk to her self or others.  However I feel patient would benefit from a structured envrionment to provide ongoing support for pain managent and social isolation.  Recommend increasing Cymbalta to 90 mg daily to maximize treatment effect for depression.  Referral for skilled or assisted living for ongoing assistnace with IADL and ADLs.  At this time I feel that Patient does not need inpatient psychiatric hositalization but does require a higher level of care to meet physical needs related to chonic pain, and limitations of mobility.  Disposition:  See above recommendations   Kennedy Bucker  PMH-NP  05/08/2014 12:21 PM

## 2014-05-08 NOTE — Progress Notes (Signed)
Met with the patient, patients niece Darius Bump, and the [place], SW about the patients care.  Answered questions about the positive MRSA PCR, antibiotics, and the isolation precautions.  Dr. Cindie Laroche was notified to clarify if he wanted the patient to continue to take the azithromax after discharge and he stated since she had been on 4 days of IV abx, then they could be discontinued at this time.  The patient also inquired to know if she could have a dose of her pain/anxitey prior to being discharged because she was not leaving until she got it.  New orders given and followed.

## 2014-05-08 NOTE — Evaluation (Signed)
Physical Therapy Evaluation Patient Details Name: Ashley Hall MRN: [id number] DOB: [birth year] Today's Date: 05/08/2014   History of Present Illness  Pt found down this morning by home nursing aid surounded by empty medication bottles, empty alcohol bottles, confused, and covered in feces and urine. EMS was called for emergency transport. Pt was last seen in her normal state the prior evening. Pt  Unable to give history at the time of presentation but POC, Leann ANgel, was contacted to discuss further. She states that the pt was recently started on a Zpack for respiratory infection. She has a h/o ETOH abuse and was using ETOH to control back pain. She falls frequently and has been becoming progressively weaker over the past few weeks.   Pt lives alone in a retirement apartment complex and has an aide 6 hours/day to assist with all ALDs and gait with a walker.  Pt reports that she has chronic pain "all over" with her back being the worst.  She takes 10 mg. Percocet every 6 hours by her report.  Niece states that pt has barely been able to stand up even with her walker at home.  Clinical Impression   Pt was seen for evaluation.  She was pleasant and cooperative but had difficulty focusing on task, was impulsive , and had difficulty following simple directions.   She demonstrated general weakness and deconditioning, RLE weaker than the LLE.  Her coordination is WNL.  The 5th toe of her right foot looks as if it might be fractured from a previous fall.  She was able to transfer with standby assist and she amblated 18' with a walker, min assist.  She stated that she felt too weak to walk any further.  Her niece was present for the evaluation and stated the pt's gait was [facility] improved today than it has been at home.  I would expect that she has been overmedicating herself with pain meds which results in the inability to function safely at home.  How much of this balance deficit is related to neurologic  dysfunction is not clear to me.  Her discharge disposition is being evaluated at this time.  I have recommended SNF for work on pt's physical disabilities.  There has also been a recommendation for inpatient psych placement.  This will have to be resolved with the medical staff and pt/family.    Follow Up Recommendations SNF (or HHPT if pt is transferred to a psych facility)    Equipment Recommendations  None recommended by PT    Recommendations for Other Services   OT    Precautions / Restrictions Precautions Precautions: Fall Restrictions Weight Bearing Restrictions: No      Mobility  Bed Mobility Overal bed mobility: Modified Independent             General bed mobility comments: sleeps in a lift chair...uses bedrail to help pull up to sitting  Transfers Overall transfer level: Needs assistance Equipment used: Rolling walker (2 wheeled) Transfers: Sit to/from Stand Sit to Stand: Min guard         General transfer comment: pt is able to stand from sitting with labored transfer and poor balance standing...needs to immediately hold onto walker  Ambulation/Gait Ambulation/Gait assistance: Min assist Ambulation Distance (Feet): 20 Feet Assistive device: Rolling walker (2 wheeled) Gait Pattern/deviations: Decreased step length - right;Decreased step length - left;Decreased weight shift to left Gait velocity: left knee buckles in stance      Stairs  Wheelchair Mobility    Modified Rankin (Stroke Patients Only)       Balance Overall balance assessment: History of Falls;Needs assistance Sitting-balance support: No upper extremity supported;Feet supported Sitting balance-Leahy Scale: Good Sitting balance - Comments: pt c/o dizziness on sitting   Standing balance support: No upper extremity supported Standing balance-Leahy Scale: Poor                               Pertinent Vitals/Pain Pain Assessment: 0-10 Pain Score: 10-Worst  pain ever Pain Location: pain is "all over", worst in the low back Pain Descriptors / Indicators: Aching Pain Intervention(s): Repositioned;Patient requesting pain meds-RN notified;RN gave pain meds during session    Home Living Family/patient expects to be discharged to:: Skilled nursing facility (possible discharge to a psychiatric hospital)                      Prior Function Level of Independence: Needs assistance   Gait / Transfers Assistance Needed: pt needed standby assist with transfers and gait with a walker  ADL's / Homemaking Assistance Needed: assist with bathing and dressing, all houselhold activities...Marland Kitchenpt has an aide from [place] 6 hours/day        Hand Dominance   Dominant Hand: Left    Extremity/Trunk Assessment   Upper Extremity Assessment: Generalized weakness           Lower Extremity Assessment: RLE deficits/detail;LLE deficits/detail RLE Deficits / Details: strength generally 3-/5 with severe bruising over the dorsum of the distal 5th metatarsal and 5th toe...it looks like it might be fractured...coordination is WNL LLE Deficits / Details: strength generally 3+/5, coordination WNL  Cervical / Trunk Assessment: Kyphotic;Other exceptions  Communication   Communication: HOH  Cognition Arousal/Alertness: Awake/alert Behavior During Therapy: WFL for tasks assessed/performed Overall Cognitive Status: History of cognitive impairments - at baseline (difficulty focusing on task, impulsive, difficulty following simple directions)                      General Comments      Exercises        Assessment/Plan    PT Assessment Patient needs continued PT services  PT Diagnosis Difficulty walking;Abnormality of gait;Generalized weakness (chronic pain)   PT Problem List Decreased strength;Decreased activity tolerance;Decreased balance;Decreased mobility;Decreased cognition;Decreased safety awareness;Pain  PT Treatment Interventions Gait  training;Functional mobility training;Therapeutic exercise;Balance training   PT Goals (Current goals can be found in the Care Plan section) Acute Rehab PT Goals Patient Stated Goal: none stated PT Goal Formulation: With patient/family Time For Goal Achievement: 05/22/14 Potential to Achieve Goals: Fair    Frequency Min 3X/week   Barriers to discharge Decreased caregiver support pt should have 24 hour support    Co-evaluation               End of Session Equipment Utilized During Treatment: Gait belt Activity Tolerance: Patient tolerated treatment well Patient left: in bed;with call bell/phone within reach;with bed alarm set Nurse Communication: Mobility status         Time: 0921-1009 PT Time Calculation (min) (ACUTE ONLY): 48 min   Charges:   PT Evaluation $Initial PT Evaluation Tier I: 1 Procedure     PT G CodesOwens Shark, Annalyce Lanpher L 05/08/2014, 10:30 AM

## 2014-05-08 NOTE — Progress Notes (Signed)
Echo reviewed, normal LVEF 60-65% with no wall motion abnormalities. 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 Appears to be demand ischemia.  Will sign off inpatient care, will have her f/u with NP Purcell Nails in 1 month to establish with our outpatient office.  Zandra Abts MD

## 2014-05-11 LAB — CULTURE, BLOOD (ROUTINE X 2)
CULTURE: NO GROWTH
Culture: NO GROWTH

## 2014-06-08 ENCOUNTER — Encounter: Payer: Self-pay | Admitting: Adult Health

## 2014-06-08 ENCOUNTER — Encounter: Payer: PRIVATE HEALTH INSURANCE | Admitting: Adult Health

## 2014-06-08 NOTE — Progress Notes (Signed)
    Error NO show

## 2014-10-03 ENCOUNTER — Emergency Department (HOSPITAL_COMMUNITY)
Admission: EM | Admit: 2014-10-03 | Discharge: 2014-10-03 | Disposition: A | Discharge status: Home / Self Care | Payer: Medicare Other | Attending: Emergency Medicine | Admitting: Emergency Medicine

## 2014-10-03 ENCOUNTER — Encounter (HOSPITAL_COMMUNITY): Payer: Self-pay | Admitting: *Deleted

## 2014-10-03 ENCOUNTER — Emergency Department (HOSPITAL_COMMUNITY): Payer: Medicare Other

## 2014-10-03 DIAGNOSIS — Z79899 Other long term (current) drug therapy: Secondary | ICD-10-CM | POA: Diagnosis not present

## 2014-10-03 DIAGNOSIS — J449 Chronic obstructive pulmonary disease, unspecified: Secondary | ICD-10-CM | POA: Diagnosis not present

## 2014-10-03 DIAGNOSIS — F319 Bipolar disorder, unspecified: Secondary | ICD-10-CM | POA: Diagnosis not present

## 2014-10-03 DIAGNOSIS — I10 Essential (primary) hypertension: Secondary | ICD-10-CM | POA: Diagnosis not present

## 2014-10-03 DIAGNOSIS — I952 Hypotension due to drugs: Secondary | ICD-10-CM

## 2014-10-03 DIAGNOSIS — R001 Bradycardia, unspecified: Secondary | ICD-10-CM | POA: Insufficient documentation

## 2014-10-03 DIAGNOSIS — W01198A Fall on same level from slipping, tripping and stumbling with subsequent striking against other object, initial encounter: Secondary | ICD-10-CM | POA: Diagnosis not present

## 2014-10-03 DIAGNOSIS — Z791 Long term (current) use of non-steroidal anti-inflammatories (NSAID): Secondary | ICD-10-CM | POA: Insufficient documentation

## 2014-10-03 DIAGNOSIS — G8929 Other chronic pain: Secondary | ICD-10-CM | POA: Diagnosis not present

## 2014-10-03 DIAGNOSIS — M1990 Unspecified osteoarthritis, unspecified site: Secondary | ICD-10-CM | POA: Diagnosis not present

## 2014-10-03 DIAGNOSIS — Y998 Other external cause status: Secondary | ICD-10-CM | POA: Insufficient documentation

## 2014-10-03 DIAGNOSIS — Z720 Tobacco use: Secondary | ICD-10-CM | POA: Insufficient documentation

## 2014-10-03 DIAGNOSIS — K5900 Constipation, unspecified: Secondary | ICD-10-CM | POA: Diagnosis not present

## 2014-10-03 DIAGNOSIS — Y9389 Activity, other specified: Secondary | ICD-10-CM | POA: Insufficient documentation

## 2014-10-03 DIAGNOSIS — S0990XA Unspecified injury of head, initial encounter: Secondary | ICD-10-CM | POA: Diagnosis present

## 2014-10-03 DIAGNOSIS — Y92008 Other place in unspecified non-institutional (private) residence as the place of occurrence of the external cause: Secondary | ICD-10-CM | POA: Diagnosis not present

## 2014-10-03 DIAGNOSIS — K219 Gastro-esophageal reflux disease without esophagitis: Secondary | ICD-10-CM | POA: Insufficient documentation

## 2014-10-03 DIAGNOSIS — W19XXXA Unspecified fall, initial encounter: Secondary | ICD-10-CM

## 2014-10-03 DIAGNOSIS — G40909 Epilepsy, unspecified, not intractable, without status epilepticus: Secondary | ICD-10-CM | POA: Insufficient documentation

## 2014-10-03 DIAGNOSIS — T447X5A Adverse effect of beta-adrenoreceptor antagonists, initial encounter: Secondary | ICD-10-CM | POA: Diagnosis not present

## 2014-10-03 HISTORY — DX: Gastro-esophageal reflux disease without esophagitis: K21.9

## 2014-10-03 HISTORY — DX: Depression, unspecified: F32.A

## 2014-10-03 HISTORY — DX: Essential (primary) hypertension: I10

## 2014-10-03 HISTORY — DX: Major depressive disorder, single episode, unspecified: F32.9

## 2014-10-03 HISTORY — DX: Bipolar disorder, unspecified: F31.9

## 2014-10-03 HISTORY — DX: Unspecified convulsions: R56.9

## 2014-10-03 NOTE — ED Notes (Signed)
Pt fall this morning at 0200; reports sitting on toilet and thinks she feel asleep and hit head on counter; denies LOC

## 2014-10-03 NOTE — ED Notes (Signed)
CT called, 45 min ETA for CT.

## 2014-10-03 NOTE — Discharge Instructions (Signed)
Fall Prevention and Home Safety Falls cause injuries and can affect all age groups. It is possible to use preventive measures to significantly decrease the likelihood of falls. There are many simple measures which can make your home safer and prevent falls. OUTDOORS  Repair cracks and edges of walkways and driveways.  Remove high doorway thresholds.  Trim shrubbery on the main path into your home.  Have good outside lighting.  Clear walkways of tools, rocks, debris, and clutter.  Check that handrails are not broken and are securely fastened. Both sides of steps should have handrails.  Have leaves, snow, and ice cleared regularly.  Use sand or salt on walkways during winter months.  In the garage, clean up grease or oil spills. BATHROOM  Install night lights.  Install grab bars by the toilet and in the tub and shower.  Use non-skid mats or decals in the tub or shower.  Place a plastic non-slip stool in the shower to sit on, if needed.  Keep floors dry and clean up all water on the floor immediately.  Remove soap buildup in the tub or shower on a regular basis.  Secure bath mats with non-slip, double-sided rug tape.  Remove throw rugs and tripping hazards from the floors. BEDROOMS  Install night lights.  Make sure a bedside light is easy to reach.  Do not use oversized bedding.  Keep a telephone by your bedside.  Have a firm chair with side arms to use for getting dressed.  Remove throw rugs and tripping hazards from the floor. KITCHEN  Keep handles on pots and pans turned toward the center of the stove. Use back burners when possible.  Clean up spills quickly and allow time for drying.  Avoid walking on wet floors.  Avoid hot utensils and knives.  Position shelves so they are not too high or low.  Place commonly used objects within easy reach.  If necessary, use a sturdy step stool with a grab bar when reaching.  Keep electrical cables out of the  way.  Do not use floor polish or wax that makes floors slippery. If you must use wax, use non-skid floor wax.  Remove throw rugs and tripping hazards from the floor. STAIRWAYS  Never leave objects on stairs.  Place handrails on both sides of stairways and use them. Fix any loose handrails. Make sure handrails on both sides of the stairways are as long as the stairs.  Check carpeting to make sure it is firmly attached along stairs. Make repairs to worn or loose carpet promptly.  Avoid placing throw rugs at the top or bottom of stairways, or properly secure the rug with carpet tape to prevent slippage. Get rid of throw rugs, if possible.  Have an electrician put in a light switch at the top and bottom of the stairs. OTHER FALL PREVENTION TIPS  Wear low-heel or rubber-soled shoes that are supportive and fit well. Wear closed toe shoes.  When using a stepladder, make sure it is fully opened and both spreaders are firmly locked. Do not climb a closed stepladder.  Add color or contrast paint or tape to grab bars and handrails in your home. Place contrasting color strips on first and last steps.  Learn and use mobility aids as needed. Install an electrical emergency response system.  Turn on lights to avoid dark areas. Replace light bulbs that burn out immediately. Get light switches that glow.  Arrange furniture to create clear pathways. Keep furniture in the same place.  Firmly attach carpet with non-skid or double-sided tape.  Eliminate uneven floor surfaces.  Select a carpet pattern that does not visually hide the edge of steps.  Be aware of all pets. OTHER HOME SAFETY TIPS  Set the water temperature for 120 F (48.8 C).  Keep emergency numbers on or near the telephone.  Keep smoke detectors on every level of the home and near sleeping areas. Document Released: 05/05/2002 Document Revised: 11/14/2011 Document Reviewed: 08/04/2011 [facility] Patient Information 2015  [place], [place]. This information is not intended to replace advice given to you by your health care provider. Make sure you discuss any questions you have with your health care provider.  Head Injury You have a head injury. Headaches and throwing up (vomiting) are common after a head injury. It should be easy to wake up from sleeping. Sometimes you must stay in the hospital. Most problems happen within the first 24 hours. Side effects may occur up to 7-10 days after the injury.  WHAT ARE THE TYPES OF HEAD INJURIES? Head injuries can be as minor as a bump. Some head injuries can be more severe. More severe head injuries include:  A jarring injury to the brain (concussion).  A bruise of the brain (contusion). This mean there is bleeding in the brain that can cause swelling.  A cracked skull (skull fracture).  Bleeding in the brain that collects, clots, and forms a bump (hematoma). WHEN SHOULD I GET HELP RIGHT AWAY?   You are confused or sleepy.  You cannot be woken up.  You feel sick to your stomach (nauseous) or keep throwing up (vomiting).  Your dizziness or unsteadiness is getting worse.  You have very bad, lasting headaches that are not helped by medicine. Take medicines only as told by your doctor.  You cannot use your arms or legs like normal.  You cannot walk.  You notice changes in the black spots in the center of the colored part of your eye (pupil).  You have clear or bloody fluid coming from your nose or ears.  You have trouble seeing. During the next 24 hours after the injury, you must stay with someone who can watch you. This person should get help right away (call 911 in the [place] if you start to shake and are not able to control it (have seizures), you pass out, or you are unable to wake up. HOW CAN I PREVENT A HEAD INJURY IN THE FUTURE?  Wear seat belts.  Wear a helmet while bike riding and playing sports like football.  Stay away from dangerous activities  around the house. WHEN CAN I RETURN TO NORMAL ACTIVITIES AND ATHLETICS? See your doctor before doing these activities. You should not do normal activities or play contact sports until 1 week after the following symptoms have stopped:  Headache that does not go away.  Dizziness.  Poor attention.  Confusion.  Memory problems.  Sickness to your stomach or throwing up.  Tiredness.  Fussiness.  Bothered by bright lights or loud noises.  Anxiousness or depression.  Restless sleep. MAKE SURE YOU:   Understand these instructions.  Will watch your condition.  Will get help right away if you are not doing well or get worse. Document Released: 04/27/2008 Document Revised: 09/29/2013 Document Reviewed: 01/20/2013 [facility] Patient Information 2015 [place], [place]. This information is not intended to replace advice given to you by your health care provider. Make sure you discuss any questions you have with your health care provider.  Hypotension As  your heart beats, it forces blood through your body. This force is called blood pressure. If you have hypotension, you have low blood pressure. When your blood pressure is too low, you may not get enough blood to your brain. You may feel weak, feel lightheaded, have a fast heartbeat, or even pass out (faint). HOME CARE  Drink enough fluids to keep your pee (urine) clear or pale yellow.  Take all medicines as told by your doctor.  Get up slowly after sitting or lying down.  Wear support stockings as told by your doctor.  Maintain a healthy diet by including foods such as fruits, vegetables, nuts, whole grains, and lean meats. GET HELP IF:  You are throwing up (vomiting) or have watery poop (diarrhea).  You have a fever for more than 2-3 days.  You feel more thirsty than usual.  You feel weak and tired. GET HELP RIGHT AWAY IF:   You pass out (faint).  You have chest pain or a fast or irregular heartbeat.  You lose feeling in  part of your body.  You cannot move your arms or legs.  You have trouble speaking.  You get sweaty or feel lightheaded. MAKE SURE YOU:   Understand these instructions.  Will watch your condition.  Will get help right away if you are not doing well or get worse. Document Released: 08/09/2009 Document Revised: 01/15/2013 Document Reviewed: 11/15/2012 [facility] Patient Information 2015 [place], [place]. This information is not intended to replace advice given to you by your health care provider. Make sure you discuss any questions you have with your health care provider.    Your blood pressure and your pulse has been low since you have been here, and is possibly related to your metoprolol blood pressure medicine.  Stop taking this medicine until you talk to your doctor on Monday.

## 2014-10-03 NOTE — ED Notes (Signed)
CNA -P[place]y [phone].

## 2014-10-05 NOTE — ED Provider Notes (Signed)
CSN: [id number]     Arrival date & time 10/03/14  1225 History   First MD Initiated Contact with Patient 10/03/14 1240     Chief Complaint  Patient presents with  . Fall     (Consider location/radiation/quality/duration/timing/severity/associated sxs/prior Treatment) The history is provided by the patient.   Ashley BEYERSDORF is a 69 y.o. female with a history of HTN, seizure disorder, COPD,  history of etoh abuse, denies current use and chronic pain treated with oxycodone presenting with fall and injury to her head occuring around 2 am today.  She uses a walker at baseline for ambulation which she used at 2 am today to get to her bathroom. She reports possibly falling asleep while sitting on the commode, falling and striking her head on the edge of the counter.  She did however hit the floor, and woke immediately with the impact.  She reports persistent localized pain and swelling at the site, but is otherwise without complaint.  She has taken her oxycodone for the chronic back pain but has not relieved her headache.  She also mentions a fall she had last week, but denies sequelae from this fall. She denies dizziness, focal weakness, nausea, vomiting, visual changes, nausea or vomiting since she fell today.  She does endorse generalized fatigue, but not worsened today.   Past Medical History  Diagnosis Date  . Arthritis   . Chronic back pain     Dr Merlene Laughter  . Chronic constipation   . S/P colonoscopy 12/28/05    Dr Catalina Antigua prep, lipoma left colon  . Sinus drainage   . Alcohol abuse, in remission   . COPD (chronic obstructive pulmonary disease)   . Sedative, hypnotic or anxiolytic dependence   . Neurotic depression   . Personality disorder   . ETOH abuse   . Seizures   . Bipolar 1 disorder   . Acid reflux   . Depression   . Hypertension    Past Surgical History  Procedure Laterality Date  . Complete hysterectomy    . Appendectomy     Family History  Problem Relation Age of  Onset  . Heart attack Mother     Deceased  . Diabetes Mother     Deceased  . Cancer Father     Deceased  . Schizophrenia Brother    History  Substance Use Topics  . Smoking status: Heavy Tobacco Smoker -- 0.50 packs/day for 48 years    Types: Cigarettes  . Smokeless tobacco: Not on file     Comment: smoke the fake cigarettes now  . Alcohol Use: Yes     Comment: heavy etoh x47yr, quit 2004   OB History    No data available     Review of Systems  Constitutional: Positive for fatigue. Negative for fever and activity change.  HENT: Negative for congestion and sore throat.   Eyes: Negative.  Negative for visual disturbance.  Respiratory: Negative for chest tightness and shortness of breath.   Cardiovascular: Negative for chest pain.  Gastrointestinal: Negative for nausea and abdominal pain.  Genitourinary: Negative.   Musculoskeletal: Positive for arthralgias. Negative for back pain, joint swelling and neck pain.  Skin: Negative.  Negative for rash and wound.  Neurological: Positive for headaches. Negative for dizziness, tremors, seizures, weakness, light-headedness and numbness.  Psychiatric/Behavioral: Negative.       Allergies  Pregabalin  Home Medications   Prior to Admission medications   Medication Sig Start Date End Date Taking? Authorizing Provider  ALPRAZolam (XANAX) 1 MG tablet Take 1 mg by mouth 4 (four) times daily.   Yes Historical Provider, MD  amLODipine (NORVASC) 5 MG tablet Take 5 mg by mouth daily.     Yes Historical Provider, MD  Ascorbic Acid (VITAMIN C) 1000 MG tablet Take 1,000 mg by mouth daily.     Yes Historical Provider, MD  Calcium Carbonate-Vitamin D (OYSTER SHELL CALCIUM 500 + D) 500-125 MG-UNIT TABS Take 1 tablet by mouth daily.    Yes Historical Provider, MD  DULoxetine (CYMBALTA) 60 MG capsule Take 60 mg by mouth daily.     Yes Historical Provider, MD  ferrous gluconate (FERGON) 325 MG tablet Take 325 mg by mouth daily with breakfast.      Yes Historical Provider, MD  fish oil-omega-3 fatty acids 1000 MG capsule Take 2 g by mouth daily.     Yes Historical Provider, MD  gabapentin (NEURONTIN) 300 MG capsule Take 300 mg by mouth Daily. 11/04/10  Yes Historical Provider, MD  levothyroxine (SYNTHROID, LEVOTHROID) 50 MCG tablet Take 50 mcg by mouth daily. 04/08/14  Yes Historical Provider, MD  metoprolol tartrate (LOPRESSOR) 25 MG tablet Take 37.5 mg by mouth 2 (two) times daily.  11/16/10  Yes Historical Provider, MD  Multiple Vitamin (MULTIVITAMIN) capsule Take 1 capsule by mouth daily.     Yes Historical Provider, MD  naproxen (NAPROSYN) 500 MG tablet Take 500 mg by mouth 2 (two) times daily. 10/01/14  Yes Historical Provider, MD  oxyCODONE-acetaminophen (PERCOCET) 10-325 MG per tablet Take 1 tablet by mouth every 6 (six) hours as needed. Patient taking differently: Take 1 tablet by mouth every 6 (six) hours as needed for pain.  05/08/14  Yes Lucia Gaskins, MD  pantoprazole (PROTONIX) 40 MG tablet Take 40 mg by mouth daily.     Yes Historical Provider, MD  polyethylene glycol powder (GLYCOLAX/MIRALAX) powder Take 17 g by mouth daily. 04/08/14  Yes Historical Provider, MD  pravastatin (PRAVACHOL) 40 MG tablet Take 1 tablet (40 mg total) by mouth at bedtime. 05/08/14  Yes Lucia Gaskins, MD  PROAIR HFA 108 (90 BASE) MCG/ACT inhaler Inhale 2 puffs into the lungs 4 (four) times daily as needed for wheezing or shortness of breath.  09/15/14  Yes Historical Provider, MD  topiramate (TOPAMAX) 100 MG tablet Take 100 mg by mouth 2 (two) times daily.     Yes Historical Provider, MD  traZODone (DESYREL) 150 MG tablet Take 2 tablets by mouth at bedtime. 09/30/14  Yes Historical Provider, MD  potassium chloride SA (K-DUR,KLOR-CON) 20 MEQ tablet Take 1 tablet (20 mEq total) by mouth daily. Patient not taking: Reported on 10/03/2014 05/09/14   Lucia Gaskins, MD   BP 111/51 mmHg  Pulse 48  Temp(Src) 98 F (36.7 C) (Oral)  Resp 13  Ht '5\' 3"'$  (1.6 m)   Wt 130 lb (58.968 kg)  BMI 23.03 kg/m2  SpO2 100% Physical Exam  Constitutional: She appears well-developed and well-nourished.  HENT:  Head: Normocephalic and atraumatic.  Eyes: Conjunctivae and EOM are normal. Pupils are equal, round, and reactive to light.  Neck: Normal range of motion. Neck supple.  Cardiovascular: Regular rhythm, normal heart sounds and intact distal pulses.  Bradycardia present.   Pulmonary/Chest: Effort normal and breath sounds normal. She has no decreased breath sounds. She has no wheezes. She has no rhonchi.  Abdominal: Soft. Bowel sounds are normal. There is no tenderness.  Musculoskeletal: Normal range of motion.  Neurological: She is alert. She has normal strength. No  cranial nerve deficit or sensory deficit. GCS eye subscore is 4. GCS verbal subscore is 5. GCS motor subscore is 6.  Skin: Skin is warm and dry.  Psychiatric: She has a normal mood and affect.  Nursing note and vitals reviewed.   ED Course  Procedures (including critical care time) Labs Review Labs Reviewed - No data to display  Imaging Review No results found.   EKG Interpretation None      MDM   Final diagnoses:  Fall  Minor head injury without loss of consciousness, initial encounter  Hypotension due to drugs    Patients labs and/or radiological studies were reviewed and considered during the medical decision making and disposition process.  Results were also discussed with patient.  Pt had persistent bradycardia and hypotension during this ed visit, however had no c/o orthostasis when orthostatic VS obtained and when ambulated in dept.   Reports her metroprolol was recently increased to 37.5 mg bid for control of nocturnal muscle spasm (?).  Advised pt to hold this medication until Monday and to call her pcp for further management and advice regarding this med and recheck bp's early this week.  Pt understands and agrees with plan.   Discussed pt with Dr. Betsey Holiday who also saw  pt during this ed visit.    Evalee Jefferson, PA-C 10/05/14 1813  Orpah Greek, MD 10/08/14 [phone]

## 2015-01-28 ENCOUNTER — Other Ambulatory Visit (HOSPITAL_COMMUNITY): Payer: Self-pay | Admitting: Family Medicine

## 2015-01-28 ENCOUNTER — Ambulatory Visit (HOSPITAL_COMMUNITY)
Admission: RE | Admit: 2015-01-28 | Discharge: 2015-01-28 | Disposition: A | Discharge status: Home / Self Care | Payer: Medicare Other | Source: Ambulatory Visit | Attending: Family Medicine | Admitting: Family Medicine

## 2015-01-28 DIAGNOSIS — M25511 Pain in right shoulder: Secondary | ICD-10-CM | POA: Diagnosis present

## 2015-01-28 DIAGNOSIS — R531 Weakness: Secondary | ICD-10-CM | POA: Insufficient documentation

## 2015-01-28 DIAGNOSIS — R200 Anesthesia of skin: Secondary | ICD-10-CM | POA: Insufficient documentation

## 2015-01-28 DIAGNOSIS — W19XXXA Unspecified fall, initial encounter: Secondary | ICD-10-CM

## 2015-09-06 ENCOUNTER — Encounter: Payer: Self-pay | Admitting: Physical Medicine & Rehabilitation

## 2015-10-21 ENCOUNTER — Ambulatory Visit (HOSPITAL_COMMUNITY)
Admission: RE | Admit: 2015-10-21 | Discharge: 2015-10-21 | Disposition: A | Discharge status: Home / Self Care | Payer: Medicare Other | Source: Ambulatory Visit | Attending: Family Medicine | Admitting: Family Medicine

## 2015-10-21 ENCOUNTER — Other Ambulatory Visit (HOSPITAL_COMMUNITY): Payer: Self-pay | Admitting: Family Medicine

## 2015-10-21 DIAGNOSIS — M1990 Unspecified osteoarthritis, unspecified site: Secondary | ICD-10-CM | POA: Diagnosis present

## 2015-10-21 DIAGNOSIS — R937 Abnormal findings on diagnostic imaging of other parts of musculoskeletal system: Secondary | ICD-10-CM | POA: Insufficient documentation

## 2015-10-21 DIAGNOSIS — T148XXA Other injury of unspecified body region, initial encounter: Secondary | ICD-10-CM

## 2016-05-25 ENCOUNTER — Encounter (INDEPENDENT_AMBULATORY_CARE_PROVIDER_SITE_OTHER): Payer: Self-pay | Admitting: Internal Medicine

## 2016-05-25 ENCOUNTER — Encounter (INDEPENDENT_AMBULATORY_CARE_PROVIDER_SITE_OTHER): Payer: Self-pay

## 2016-06-13 ENCOUNTER — Encounter (INDEPENDENT_AMBULATORY_CARE_PROVIDER_SITE_OTHER): Payer: Self-pay | Admitting: Internal Medicine

## 2016-06-13 ENCOUNTER — Ambulatory Visit (INDEPENDENT_AMBULATORY_CARE_PROVIDER_SITE_OTHER): Payer: Medicare Other | Admitting: Internal Medicine

## 2016-06-13 ENCOUNTER — Encounter (INDEPENDENT_AMBULATORY_CARE_PROVIDER_SITE_OTHER): Payer: Self-pay | Admitting: *Deleted

## 2016-06-13 ENCOUNTER — Other Ambulatory Visit (INDEPENDENT_AMBULATORY_CARE_PROVIDER_SITE_OTHER): Payer: Self-pay | Admitting: Internal Medicine

## 2016-06-13 VITALS — BP 112/60 | HR 56 | Temp 98.4°F | Ht 62.0 in | Wt 122.8 lb

## 2016-06-13 DIAGNOSIS — K219 Gastro-esophageal reflux disease without esophagitis: Secondary | ICD-10-CM

## 2016-06-13 NOTE — Progress Notes (Signed)
Subjective:    Patient ID: Ashley Hall, female    DOB: 1946-01-07, 71 y.o.   MRN: [id number]  HPI  Referred by Dr. Cindie Laroche for dysphagia. She has pain in her chest. She thinks she has an ulcer. She takes Protonix for acid reflux. She has pain when she eats certain things. She eats soft foods. She cannot eat bacon or sausage because of the pain in her esophagus.  She says she has hx of PUD 17 yrs ago.   12/28/2005 Colonoscopy: average risk. Dr. Oneida Alar:   FINDINGS:  1. Moderate amount of retained particulates liquid stool seen throughout      the colon.  The amount of stool limited the exam.  Polyps less than 1      cm could have been easily missed.  2. Submucosal lesion seen in the left colon.  It had a yellow hue and      pillow-like consistency when depressed with the cold forcep and is      likely a lipoma.  Biopsies were obtained with cold forceps.  3. No masses, inflammatory changes, vascular ectasias or diverticula were      seen.  COLON, BIOPSIES: BENIGN COLONIC MUCOSA. NO ACTIVE INFLAMMATION, CHRONIC CHANGES OR GRANULOMAS.  COMMENT Multiple deeper levels are performed and the findings are similar. (JDP:mj 01/01/06)   Review of Systems     Past Medical History:  Diagnosis Date  . Acid reflux   . Alcohol abuse, in remission   . Arthritis   . Bipolar 1 disorder ([place])   . Chronic back pain    Dr Merlene Laughter  . Chronic constipation   . COPD (chronic obstructive pulmonary disease) ([place])   . Depression   . ETOH abuse   . Hypertension   . Neurotic depression   . Personality disorder   . S/P colonoscopy 12/28/05   Dr Catalina Antigua prep, lipoma left colon  . Sedative, hypnotic or anxiolytic dependence ([place])   . Seizures ([place])   . Sinus drainage     Past Surgical History:  Procedure Laterality Date  . APPENDECTOMY    . complete hysterectomy      Allergies  Allergen Reactions  . Pregabalin     REACTION: "couldn't move"    Current Outpatient Prescriptions  on File Prior to Visit  Medication Sig Dispense Refill  . ALPRAZolam (XANAX) 1 MG tablet Take 1 mg by mouth 4 (four) times daily.    Marland Kitchen amLODipine (NORVASC) 5 MG tablet Take 5 mg by mouth daily.      . Ascorbic Acid (VITAMIN C) 1000 MG tablet Take 1,000 mg by mouth daily.      . DULoxetine (CYMBALTA) 60 MG capsule Take 60 mg by mouth daily.      . ferrous gluconate (FERGON) 325 MG tablet Take 325 mg by mouth daily with breakfast.      . fish oil-omega-3 fatty acids 1000 MG capsule Take 2 g by mouth daily.      Marland Kitchen gabapentin (NEURONTIN) 300 MG capsule Take 300 mg by mouth Daily.    Marland Kitchen levothyroxine (SYNTHROID, LEVOTHROID) 50 MCG tablet Take 50 mcg by mouth daily.  11  . metoprolol tartrate (LOPRESSOR) 25 MG tablet Take 37.5 mg by mouth 2 (two) times daily.     . Multiple Vitamin (MULTIVITAMIN) capsule Take 1 capsule by mouth daily.      . naproxen (NAPROSYN) 500 MG tablet Take 500 mg by mouth 2 (two) times daily.  4  . oxyCODONE-acetaminophen (PERCOCET) 10-325  MG per tablet Take 1 tablet by mouth every 6 (six) hours as needed. (Patient taking differently: Take 1 tablet by mouth every 6 (six) hours as needed for pain. ) 120 tablet 0  . pantoprazole (PROTONIX) 40 MG tablet Take 40 mg by mouth daily.      . pravastatin (PRAVACHOL) 40 MG tablet Take 1 tablet (40 mg total) by mouth at bedtime. 30 tablet 2  . PROAIR HFA 108 (90 BASE) MCG/ACT inhaler Inhale 2 puffs into the lungs 4 (four) times daily as needed for wheezing or shortness of breath.   5  . topiramate (TOPAMAX) 100 MG tablet Take 100 mg by mouth 2 (two) times daily.      . traZODone (DESYREL) 150 MG tablet Take 2 tablets by mouth at bedtime.  3   No current facility-administered medications on file prior to visit.     Objective:   Physical Exam Blood pressure 112/60, pulse (!) 56, temperature 98.4 F (36.9 C), height '5\' 2"'$  (1.575 m), weight 122 lb 12.8 oz (55.7 kg). Alert and oriented. Skin warm and dry. Oral mucosa is moist.   . Sclera  anicteric, conjunctivae is pink. Thyroid not enlarged. No cervical lymphadenopathy. Lungs clear. Heart regular rate and rhythm.  Abdomen is soft. Bowel sounds are positive. No hepatomegaly. No abdominal masses felt. No tenderness.  No edema to lower extremities.          Assessment & Plan:  GERD. PUD needs to be ruled out.  EGD.  The risks and benefits such as perforation, bleeding, and infection were reviewed with the patient and is agreeable.

## 2016-06-13 NOTE — Patient Instructions (Signed)
Increase Protonix to twice a day. EGD. The risks and benefits such as perforation, bleeding, and infection were reviewed with the patient and is agreeable.

## 2016-06-15 NOTE — OR Nursing (Signed)
Patient sounded very weak and thready when called to see if she would be making her esophagogastroduodenoscopy on tomorrow.  Patient stated she had only ate a frozen dinner, a swiss roll , and a small 3 musketeer.  Patient also said her nurse from [place] had not been there in two days because of the snow.I gathered she had not eaten in three days  I was very concerned because she stated she has seizures and she stated she had took 25 pills on an empty stomach.  I called her niece who would be bringing her in am for her procedure and she said this is normal behavior for her. Niece stated she is a "different type of patient"., and that she was fine.

## 2016-06-16 ENCOUNTER — Encounter (HOSPITAL_COMMUNITY): Payer: Self-pay

## 2016-06-16 ENCOUNTER — Encounter (HOSPITAL_COMMUNITY)
Admission: RE | Disposition: A | Discharge status: Home / Self Care | Payer: Self-pay | Source: Ambulatory Visit | Attending: Internal Medicine

## 2016-06-16 ENCOUNTER — Ambulatory Visit (HOSPITAL_COMMUNITY)
Admission: RE | Admit: 2016-06-16 | Discharge: 2016-06-16 | Disposition: A | Discharge status: Home / Self Care | Payer: Medicare Other | Source: Ambulatory Visit | Attending: Internal Medicine | Admitting: Internal Medicine

## 2016-06-16 DIAGNOSIS — F609 Personality disorder, unspecified: Secondary | ICD-10-CM | POA: Insufficient documentation

## 2016-06-16 DIAGNOSIS — Z888 Allergy status to other drugs, medicaments and biological substances status: Secondary | ICD-10-CM | POA: Insufficient documentation

## 2016-06-16 DIAGNOSIS — F17210 Nicotine dependence, cigarettes, uncomplicated: Secondary | ICD-10-CM | POA: Insufficient documentation

## 2016-06-16 DIAGNOSIS — F319 Bipolar disorder, unspecified: Secondary | ICD-10-CM | POA: Diagnosis not present

## 2016-06-16 DIAGNOSIS — K219 Gastro-esophageal reflux disease without esophagitis: Secondary | ICD-10-CM | POA: Insufficient documentation

## 2016-06-16 DIAGNOSIS — J449 Chronic obstructive pulmonary disease, unspecified: Secondary | ICD-10-CM | POA: Diagnosis not present

## 2016-06-16 DIAGNOSIS — I10 Essential (primary) hypertension: Secondary | ICD-10-CM | POA: Insufficient documentation

## 2016-06-16 DIAGNOSIS — R1314 Dysphagia, pharyngoesophageal phase: Secondary | ICD-10-CM | POA: Diagnosis not present

## 2016-06-16 DIAGNOSIS — M1990 Unspecified osteoarthritis, unspecified site: Secondary | ICD-10-CM | POA: Insufficient documentation

## 2016-06-16 DIAGNOSIS — K5909 Other constipation: Secondary | ICD-10-CM | POA: Diagnosis not present

## 2016-06-16 DIAGNOSIS — K254 Chronic or unspecified gastric ulcer with hemorrhage: Secondary | ICD-10-CM | POA: Diagnosis not present

## 2016-06-16 DIAGNOSIS — F1011 Alcohol abuse, in remission: Secondary | ICD-10-CM | POA: Insufficient documentation

## 2016-06-16 DIAGNOSIS — K259 Gastric ulcer, unspecified as acute or chronic, without hemorrhage or perforation: Secondary | ICD-10-CM | POA: Diagnosis not present

## 2016-06-16 DIAGNOSIS — R1013 Epigastric pain: Secondary | ICD-10-CM | POA: Diagnosis not present

## 2016-06-16 HISTORY — PX: ESOPHAGOGASTRODUODENOSCOPY: SHX5428

## 2016-06-16 SURGERY — EGD (ESOPHAGOGASTRODUODENOSCOPY)
Anesthesia: Moderate Sedation

## 2016-06-16 MED ORDER — SODIUM CHLORIDE 0.9 % IV SOLN: INTRAVENOUS | Status: DC

## 2016-06-16 MED ORDER — STERILE WATER FOR IRRIGATION IR SOLN
Status: DC | PRN
  Administered 2016-06-16: 100 mL

## 2016-06-16 MED ORDER — MIDAZOLAM HCL 5 MG/5ML IJ SOLN
INTRAMUSCULAR | Status: DC | PRN
  Administered 2016-06-16 (×2): 2 mg via INTRAVENOUS
  Administered 2016-06-16: 1 mg via INTRAVENOUS

## 2016-06-16 MED ORDER — BUTAMBEN-TETRACAINE-BENZOCAINE 2-2-14 % EX AERO
INHALATION_SPRAY | CUTANEOUS | Status: DC | PRN
  Administered 2016-06-16: 1 via TOPICAL

## 2016-06-16 MED ORDER — MIDAZOLAM HCL 5 MG/5ML IJ SOLN
INTRAMUSCULAR | Status: AC
  Filled 2016-06-16: qty 10

## 2016-06-16 MED ORDER — MEPERIDINE HCL 50 MG/ML IJ SOLN
INTRAMUSCULAR | Status: DC | PRN
  Administered 2016-06-16: 25 mg via INTRAVENOUS

## 2016-06-16 MED ORDER — PANTOPRAZOLE SODIUM 40 MG PO TBEC: 40.0000 mg | DELAYED_RELEASE_TABLET | Freq: Two times a day (BID) | ORAL | 2 refills | Status: DC

## 2016-06-16 MED ORDER — MEPERIDINE HCL 50 MG/ML IJ SOLN
INTRAMUSCULAR | Status: AC
  Filled 2016-06-16: qty 1

## 2016-06-16 NOTE — Op Note (Signed)
[facility] Patient Name: Ashley Hall Procedure Date: 06/16/2016 10:59 AM MRN: [id number] Date of Birth: 1945/08/08 Attending MD: Hildred Laser , MD CSN: [id number] Age: 71 Admit Type: Outpatient Procedure:                Upper GI endoscopy Indications:              Epigastric abdominal pain, Esophageal dysphagia Providers:                Hildred Laser, MD, Otis Peak B. Sharon Seller, RN, Rosina Lowenstein, RN, Aram Candela Referring MD:             Ralene Bathe. Dondiego, MD Medicines:                Cetacaine spray, Meperidine 25 mg IV, Midazolam 5                            mg IV Complications:            No immediate complications. Estimated Blood Loss:     Estimated blood loss was minimal. Procedure:                Pre-Anesthesia Assessment:                           - Prior to the procedure, a History and Physical                            was performed, and patient medications and                            allergies were reviewed. The patient's tolerance of                            previous anesthesia was also reviewed. The risks                            and benefits of the procedure and the sedation                            options and risks were discussed with the patient.                            All questions were answered, and informed consent                            was obtained. Prior Anticoagulants: The patient                            last took naproxen 1 day prior to the procedure.                            ASA Grade Assessment: III - A patient with severe  systemic disease. After reviewing the risks and                            benefits, the patient was deemed in satisfactory                            condition to undergo the procedure.                           After obtaining informed consent, the endoscope was                            passed under direct vision. Throughout the         procedure, the patient's blood pressure, pulse, and                            oxygen saturations were monitored continuously. The                            EG-[id number] ([id number]) scope was introduced through the                            mouth, and advanced to the second part of duodenum.                            The upper GI endoscopy was accomplished without                            difficulty. The patient tolerated the procedure                            well. Scope In: 11:43:04 AM Scope Out: 11:54:01 AM Total Procedure Duration: 0 hours 10 minutes 57 seconds  Findings:      The examined esophagus was normal.      The Z-line was regular and was found 38 cm from the incisors.      A small amount of food (residue) was found in the gastric body.      One non-bleeding surrounded by scar gastric ulcer with no stigmata of       bleeding was found in the gastric antrum. The lesion was 3 mm in largest       dimension. Biopsies were taken with a cold forceps for histology.      The exam of the stomach was otherwise normal.      The duodenal bulb and second portion of the duodenum were normal.      No endoscopic abnormality was evident in the esophagus to explain the       patient's complaint of dysphagia. It was decided, however, to proceed       with dilation of the entire esophagus. The scope was withdrawn. Dilation       was performed with a Maloney dilator with no resistance at 29 Fr. The       dilation site was examined following endoscope reinsertion and showed no       change. Impression:               -  Normal esophagus.                           - Z-line regular, 38 cm from the incisors.                           - A small amount of food (residue) in the stomach.                           - Non-bleeding gastric ulcer with near-complete                            healing and no stigmata of bleeding. Biopsied.                           - Normal duodenal bulb and second  portion of the                            duodenum.                           - No endoscopic esophageal abnormality to explain                            patient's dysphagia. Esophagus dilated. Dilated. Moderate Sedation:      Moderate (conscious) sedation was administered by the endoscopy nurse       and supervised by the endoscopist. The following parameters were       monitored: oxygen saturation, heart rate, blood pressure, CO2       capnography and response to care. Total physician intraservice time was       19 minutes. Recommendation:           - Patient has a contact number available for                            emergencies. The signs and symptoms of potential                            delayed complications were discussed with the                            patient. Return to normal activities tomorrow.                            Written discharge instructions were provided to the                            patient.                           - Patient has a contact number available for                            emergencies. The signs and symptoms of potential  delayed complications were discussed with the                            patient. Return to normal activities tomorrow.                            Written discharge instructions were provided to the                            patient.                           - Resume previous diet today.                           - Continue present medications.                           - decrease pantoprazole to 40 mg by mouth twice a                            day.                           - Discontinue Naprosyn.                           - Await pathology results.                           - Return to GI clinic in 3 months. Procedure Code(s):        --- Professional ---                           [phone], Esophagogastroduodenoscopy, flexible,                            transoral; with biopsy, single or  multiple                           43450, Dilation of esophagus, by unguided sound or                            bougie, single or multiple passes                           99152, Moderate sedation services provided by the                            same physician or other qualified health care                            professional performing the diagnostic or                            therapeutic service that the sedation supports,  requiring the presence of an independent trained                            observer to assist in the monitoring of the                            patient's level of consciousness and physiological                            status; initial 15 minutes of intraservice time,                            patient age 39 years or older Diagnosis Code(s):        --- Professional ---                           K25.9, Gastric ulcer, unspecified as acute or                            chronic, without hemorrhage or perforation                           R10.13, Epigastric pain                           R13.14, Dysphagia, pharyngoesophageal phase CPT copyright 2016 American [employer] Association. All rights reserved. The codes documented in this report are preliminary and upon coder review may  be revised to meet current compliance requirements. Hildred Laser, MD Hildred Laser, MD 06/16/2016 12:08:44 PM This report has been signed electronically. Number of Addenda: 0

## 2016-06-16 NOTE — Discharge Instructions (Signed)
Discontinue Naprosyn. Increase pantoprazole to 40 mg by mouth 30 minutes before breakfast and evening meal daily for 12 weeks. Resume other medications and diet as before. Physician will call with biopsy results. Office visit in 3 months.  Esophagogastroduodenoscopy, Care After Introduction Refer to this sheet in the next few weeks. These instructions provide you with information about caring for yourself after your procedure. Your health care provider may also give you more specific instructions. Your treatment has been planned according to current medical practices, but problems sometimes occur. Call your health care provider if you have any problems or questions after your procedure. What can I expect after the procedure? After the procedure, it is common to have:  A sore throat.  Nausea.  Bloating.  Dizziness.  Fatigue. Follow these instructions at home:  Do not eat or drink anything until the numbing medicine (local anesthetic) has worn off and your gag reflex has returned. You will know that the local anesthetic has worn off when you can swallow comfortably.  Do not drive for 24 hours if you received a medicine to help you relax (sedative).  If your health care provider took a tissue sample for testing during the procedure, make sure to get your test results. This is your responsibility. Ask your health care provider or the department performing the test when your results will be ready.  Keep all follow-up visits as told by your health care provider. This is important. Contact a health care provider if:  You cannot stop coughing.  You are not urinating.  You are urinating less than usual. Get help right away if:  You have trouble swallowing.  You cannot eat or drink.  You have throat or chest pain that gets worse.  You are dizzy or light-headed.  You faint.  You have nausea or vomiting.  You have chills.  You have a fever.  You have severe abdominal  pain.  You have black, tarry, or bloody stools. This information is not intended to replace advice given to you by your health care provider. Make sure you discuss any questions you have with your health care provider.   Some food in the stomach.  Healing ulcer.

## 2016-06-16 NOTE — H&P (Signed)
Ashley Hall is an 71 y.o. female.   Chief Complaint: Patient is here for EGD and ED. HPI: Patient is 71 year old Caucasian female with multiple medical problems: Chronic GERD who presents with few months history of dysphagia to solids. She also complains of epigastric pain which she eats. She believes her ulcer has recurred. She is on Naprosyn. She says heartburn is well controlled with therapy. She states she has lost 18 pounds in the last 6 months because she cannot eat. As nausea vomiting hematemesis melena or rectal bleeding.  Past Medical History:  Diagnosis Date  . Acid reflux   . Alcohol abuse, in remission   . Arthritis   . Bipolar 1 disorder ([place])   . Chronic back pain    Dr Merlene Laughter  . Chronic constipation   . COPD (chronic obstructive pulmonary disease) ([place])   . Depression       . Hypertension   . Neurotic depression   . Personality disorder   . S/P colonoscopy 12/28/05   Dr Catalina Antigua prep, lipoma left colon  . 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 Seizures ([place])         Past Surgical History:  Procedure Laterality Date  . APPENDECTOMY    . complete hysterectomy      Family History  Problem Relation Age of Onset  . Heart attack Mother     Deceased  . Diabetes Mother     Deceased  . Cancer Father     Deceased  . Schizophrenia Brother    Social History:  reports that she has been smoking Cigarettes.  She has a 24.00 pack-year smoking history. She has never used smokeless tobacco. She reports that she drinks alcohol. She reports that she does not use drugs.  Allergies:  Allergies  Allergen Reactions  . Pregabalin     REACTION: "couldn't move"    Medications Prior to Admission  Medication Sig Dispense Refill  . ALPRAZolam (XANAX) 1 MG tablet Take 1 mg by mouth 4 (four) times daily.    Marland Kitchen amLODipine (NORVASC) 5 MG tablet Take 5 mg by mouth daily.      . Ascorbic Acid (VITAMIN C) 1000 MG tablet Take 1,000 mg by mouth daily.      .  DULoxetine (CYMBALTA) 60 MG capsule Take 60 mg by mouth daily.      . fish oil-omega-3 fatty acids 1000 MG capsule Take 2 g by mouth daily.      Marland Kitchen gabapentin (NEURONTIN) 300 MG capsule Take 300 mg by mouth Daily.    Marland Kitchen glucosamine-chondroitin 500-400 MG tablet Take 1 tablet by mouth 3 (three) times daily.    Marland Kitchen levothyroxine (SYNTHROID, LEVOTHROID) 50 MCG tablet Take 50 mcg by mouth daily.  11  . magnesium 30 MG tablet Take 500 mg by mouth 2 (two) times daily.    . metoprolol tartrate (LOPRESSOR) 25 MG tablet Take 37.5 mg by mouth 2 (two) times daily.     . Multiple Vitamin (MULTIVITAMIN) capsule Take 1 capsule by mouth daily.      . naproxen (NAPROSYN) 500 MG tablet Take 500 mg by mouth 2 (two) times daily.  4  . oxyCODONE-acetaminophen (PERCOCET) 10-325 MG per tablet Take 1 tablet by mouth every 6 (six) hours as needed. (Patient taking differently: Take 1 tablet by mouth every 6 (six) hours as needed for pain. ) 120 tablet 0  . pantoprazole (PROTONIX) 40 MG tablet Take 40 mg by mouth daily.      Marland Kitchen  pravastatin (PRAVACHOL) 40 MG tablet Take 1 tablet (40 mg total) by mouth at bedtime. 30 tablet 2  . PROAIR HFA 108 (90 BASE) MCG/ACT inhaler Inhale 2 puffs into the lungs 4 (four) times daily as needed for wheezing or shortness of breath.   5  . topiramate (TOPAMAX) 100 MG tablet Take 100 mg by mouth 2 (two) times daily.      . traZODone (DESYREL) 150 MG tablet Take 2 tablets by mouth at bedtime.  3  . vitamin A 10000 UNIT capsule Take 8,000 Units by mouth daily.    . vitamin E 200 UNIT capsule Take 400 Units by mouth daily.    . ferrous gluconate (FERGON) 325 MG tablet Take 325 mg by mouth daily with breakfast.        No results found for this or any previous visit (from the past 48 hour(s)). No results found.  ROS  Blood pressure 119/75, pulse (!) 55, temperature 97.9 F (36.6 C), temperature source Oral, resp. rate 12, SpO2 100 %. Physical Exam  Constitutional:  Well-developed thin  Caucasian female in NAD.  HENT:  Mouth/Throat: Oropharynx is clear and moist.  Patient is edentulous. She does have dentures.  Eyes: Conjunctivae are normal. No scleral icterus.  Neck: No thyromegaly present.  Cardiovascular: Normal rate, regular rhythm and normal heart sounds.   No murmur heard. Respiratory: Effort normal and breath sounds normal.  GI:  Abdomen is symmetrical and soft with moderate midepigastric tenderness. No organomegaly or masses.  Musculoskeletal: She exhibits no edema.  Neurological: She is alert.  Skin: Skin is warm and dry.     Assessment/Plan Solid food dysphagia in patient with chronic GERD. The gastric pain.  Hildred Laser, MD 06/16/2016, 11:27 AM

## 2016-06-19 ENCOUNTER — Telehealth (INDEPENDENT_AMBULATORY_CARE_PROVIDER_SITE_OTHER): Payer: Self-pay | Admitting: *Deleted

## 2016-06-19 NOTE — Telephone Encounter (Signed)
Per op note, patient needs 3 month OV

## 2016-06-20 ENCOUNTER — Encounter (HOSPITAL_COMMUNITY): Payer: Self-pay | Admitting: Internal Medicine

## 2016-06-28 ENCOUNTER — Encounter (INDEPENDENT_AMBULATORY_CARE_PROVIDER_SITE_OTHER): Payer: Self-pay | Admitting: Internal Medicine

## 2016-06-28 NOTE — Telephone Encounter (Signed)
Patient was given an appointment for 09/14/16 at 11:30am with Deberah Castle, NP

## 2016-07-04 ENCOUNTER — Ambulatory Visit (INDEPENDENT_AMBULATORY_CARE_PROVIDER_SITE_OTHER): Payer: Medicare Other | Admitting: Orthopedic Surgery

## 2016-07-04 ENCOUNTER — Ambulatory Visit (INDEPENDENT_AMBULATORY_CARE_PROVIDER_SITE_OTHER): Payer: Medicare Other

## 2016-07-04 ENCOUNTER — Encounter: Payer: Self-pay | Admitting: Orthopedic Surgery

## 2016-07-04 VITALS — BP 100/54 | HR 69 | Wt 120.0 lb

## 2016-07-04 DIAGNOSIS — M4150 Other secondary scoliosis, site unspecified: Secondary | ICD-10-CM

## 2016-07-04 DIAGNOSIS — M5441 Lumbago with sciatica, right side: Secondary | ICD-10-CM

## 2016-07-04 DIAGNOSIS — M5442 Lumbago with sciatica, left side: Secondary | ICD-10-CM | POA: Diagnosis not present

## 2016-07-04 DIAGNOSIS — M419 Scoliosis, unspecified: Secondary | ICD-10-CM | POA: Diagnosis not present

## 2016-07-04 DIAGNOSIS — M47816 Spondylosis without myelopathy or radiculopathy, lumbar region: Secondary | ICD-10-CM | POA: Diagnosis not present

## 2016-07-04 DIAGNOSIS — I878 Other specified disorders of veins: Secondary | ICD-10-CM | POA: Diagnosis not present

## 2016-07-04 NOTE — Patient Instructions (Signed)
Start physical therapy 3 times a week 6 weeks for lower back and upper back pain  Symptomatic treatment

## 2016-07-04 NOTE — Progress Notes (Signed)
HISTORY AND PHYSICAL   Chief Complaint  Patient presents with  . Back Pain    BACK PAIN WITH BILATERAL LEG PAIN   71 year old female referred to [place] by Dr. Laural Golden with long history of lower back pain bilateral leg pain difficulty with gait giving way symptoms of both legs, status post cervical disc surgery by Dr. Trenton Gammon many years ago which was unsuccessful according to the patient.  She has lower back pain giving way of both legs with tenderness in both pretibial areas with swelling and intermittent erythema      Review of Systems  Constitutional: Negative for chills, fever and weight loss.  Musculoskeletal: Positive for back pain, falls, joint pain, myalgias and neck pain.  Neurological: Negative for tingling.    Past Medical History:  Diagnosis Date  . Acid reflux   . Alcohol abuse, in remission   . Arthritis   . Bipolar 1 disorder ([place])   . Chronic back pain    Dr Merlene Laughter  . Chronic constipation   . COPD (chronic obstructive pulmonary disease) ([place])   . Depression   . ETOH abuse   . Hypertension   . Neurotic depression   . Personality disorder   . S/P colonoscopy 12/28/05   Dr Catalina Antigua prep, lipoma left colon  . 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 Seizures ([place])   . Sinus drainage     Past Surgical History:  Procedure Laterality Date  . APPENDECTOMY    . complete hysterectomy    . ESOPHAGOGASTRODUODENOSCOPY N/A 06/16/2016   Procedure: ESOPHAGOGASTRODUODENOSCOPY (EGD);  Surgeon: Rogene Houston, MD;  Location: [facility];  Service: Endoscopy;  Laterality: N/A;  1200   Family History  Problem Relation Age of Onset  . Heart attack Mother     Deceased  . Diabetes Mother     Deceased  . Cancer Father     Deceased  . Schizophrenia Brother    Social History  Substance Use Topics  . Smoking status: Heavy Tobacco Smoker    Packs/day: 0.50    Years: 48.00    Types: Cigarettes  . Smokeless tobacco: Never Used     Comment: smoke the fake  cigarettes now  . Alcohol use Yes     Comment: heavy etoh x49yr, quit 2004   Current Meds  Medication Sig  . ALPRAZolam (XANAX) 1 MG tablet Take 1 mg by mouth 4 (four) times daily.  .Marland KitchenamLODipine (NORVASC) 5 MG tablet Take 5 mg by mouth daily.    . Ascorbic Acid (VITAMIN C) 1000 MG tablet Take 1,000 mg by mouth daily.    . B Complex-C (SUPER B COMPLEX PO) Take by mouth.  . DULoxetine (CYMBALTA) 60 MG capsule Take 60 mg by mouth daily.    . fish oil-omega-3 fatty acids 1000 MG capsule Take 2 g by mouth daily.    .Marland Kitchengabapentin (NEURONTIN) 300 MG capsule Take 300 mg by mouth Daily.  .Marland Kitchenglucosamine-chondroitin 500-400 MG tablet Take 1 tablet by mouth 3 (three) times daily.  .Marland Kitchenlevothyroxine (SYNTHROID, LEVOTHROID) 50 MCG tablet Take 50 mcg by mouth daily.  . magnesium 30 MG tablet Take 500 mg by mouth 2 (two) times daily.  . metoprolol tartrate (LOPRESSOR) 25 MG tablet Take 37.5 mg by mouth 2 (two) times daily.   .Marland KitchenoxyCODONE-acetaminophen (PERCOCET) 10-325 MG per tablet Take 1 tablet by mouth every 6 (six) hours as needed. (Patient taking differently: Take 1 tablet by mouth every 6 (six) hours as needed  for pain. )  . pantoprazole (PROTONIX) 40 MG tablet Take 1 tablet (40 mg total) by mouth 2 (two) times daily before a meal.  . pravastatin (PRAVACHOL) 40 MG tablet Take 1 tablet (40 mg total) by mouth at bedtime.  Marland Kitchen PROAIR HFA 108 (90 BASE) MCG/ACT inhaler Inhale 2 puffs into the lungs 4 (four) times daily as needed for wheezing or shortness of breath.   . topiramate (TOPAMAX) 100 MG tablet Take 100 mg by mouth 2 (two) times daily.    . traZODone (DESYREL) 150 MG tablet Take 2 tablets by mouth at bedtime.  . vitamin A 10000 UNIT capsule Take 8,000 Units by mouth daily.  . vitamin E 200 UNIT capsule Take 400 Units by mouth daily.    BP (!) 100/54   Pulse 69   Wt 120 lb (54.4 kg)   BMI 21.95 kg/m   Physical Exam The patient has cervical deformity with kyphosis and some torticollis to the  left. Tenderness between the shoulder blades as well with decreased range of motion in all planes especially extension of the cervical spine  She appears very frail but well groomed.  She is oriented to person place and time  Mood and affect are pleasant/flat Ortho Exam  Lower extremities no malalignment issues. Hip and knee right and left are normal in terms of range of motion and stability. No muscle weakness or atrophy in the right or left leg in the skin in both legs normal. Good distal pulses are noted but she does have edema erythema and tenderness in the pretibial region. Sensation is normal there are no pathologic reflexes. Knee reflexes 2+ ankle reflexes 1+.  ASSESSMENT: My personal interpretation of the images:  Plain films lumbar spine  Encounter Diagnoses  Name Primary?  . Bilateral low back pain with bilateral sciatica, unspecified chronicity Yes  . Venous stasis      PLAN  Encounter Diagnoses  Name Primary?  . Bilateral low back pain with bilateral sciatica, unspecified chronicity Yes  . Venous stasis   . Degenerative scoliosis   . Lumbar spondylosis    X-ray show degenerative scoliosis and spondylosis   Diagnosis #1 venous stasis disease recommend that she is compliant with her stockings for compression  Diagnoses #2 lower back pain with scoliosis degenerative disc disease  Recommend physical therapy  Arther Abbott, MD 3:28 PM 07/04/2016

## 2016-07-07 ENCOUNTER — Encounter (INDEPENDENT_AMBULATORY_CARE_PROVIDER_SITE_OTHER): Payer: Self-pay

## 2016-08-01 ENCOUNTER — Telehealth: Payer: Self-pay | Admitting: Orthopedic Surgery

## 2016-08-01 NOTE — Telephone Encounter (Signed)
Orlie Pollen ( PT Therapist) with [facility] left message on voicemail. He stated for a notification only on Friday 07/28/16 the patient requested cancellation of her therapy appointment. She did not want to have therapy that day. He stated his number is [phone], if we have any questions.

## 2016-08-25 ENCOUNTER — Other Ambulatory Visit (INDEPENDENT_AMBULATORY_CARE_PROVIDER_SITE_OTHER): Payer: Self-pay | Admitting: Internal Medicine

## 2016-09-14 ENCOUNTER — Ambulatory Visit (INDEPENDENT_AMBULATORY_CARE_PROVIDER_SITE_OTHER): Payer: Self-pay | Admitting: Internal Medicine

## 2016-09-21 ENCOUNTER — Ambulatory Visit (INDEPENDENT_AMBULATORY_CARE_PROVIDER_SITE_OTHER): Payer: Self-pay | Admitting: Internal Medicine

## 2017-01-11 ENCOUNTER — Ambulatory Visit (HOSPITAL_COMMUNITY)
Admission: RE | Admit: 2017-01-11 | Discharge: 2017-01-11 | Disposition: A | Discharge status: Home / Self Care | Payer: Medicare Other | Source: Ambulatory Visit | Attending: Family Medicine | Admitting: Family Medicine

## 2017-01-11 ENCOUNTER — Other Ambulatory Visit (HOSPITAL_COMMUNITY): Payer: Self-pay | Admitting: Family Medicine

## 2017-01-11 DIAGNOSIS — W19XXXA Unspecified fall, initial encounter: Secondary | ICD-10-CM | POA: Insufficient documentation

## 2017-01-11 DIAGNOSIS — R0781 Pleurodynia: Secondary | ICD-10-CM | POA: Diagnosis present

## 2017-01-11 DIAGNOSIS — T1490XA Injury, unspecified, initial encounter: Secondary | ICD-10-CM | POA: Diagnosis present

## 2017-02-28 ENCOUNTER — Other Ambulatory Visit (INDEPENDENT_AMBULATORY_CARE_PROVIDER_SITE_OTHER): Payer: Self-pay | Admitting: Internal Medicine

## 2017-03-31 ENCOUNTER — Emergency Department (HOSPITAL_COMMUNITY)
Admission: EM | Admit: 2017-03-31 | Discharge: 2017-04-01 | Disposition: A | Discharge status: Home / Self Care | Payer: Medicare Other | Attending: Emergency Medicine | Admitting: Emergency Medicine

## 2017-03-31 ENCOUNTER — Encounter (HOSPITAL_COMMUNITY): Payer: Self-pay | Admitting: *Deleted

## 2017-03-31 ENCOUNTER — Emergency Department (HOSPITAL_COMMUNITY): Payer: Medicare Other

## 2017-03-31 DIAGNOSIS — I10 Essential (primary) hypertension: Secondary | ICD-10-CM | POA: Insufficient documentation

## 2017-03-31 DIAGNOSIS — D539 Nutritional anemia, unspecified: Secondary | ICD-10-CM

## 2017-03-31 DIAGNOSIS — D649 Anemia, unspecified: Secondary | ICD-10-CM | POA: Diagnosis not present

## 2017-03-31 DIAGNOSIS — G8929 Other chronic pain: Secondary | ICD-10-CM | POA: Insufficient documentation

## 2017-03-31 DIAGNOSIS — F17210 Nicotine dependence, cigarettes, uncomplicated: Secondary | ICD-10-CM | POA: Diagnosis not present

## 2017-03-31 DIAGNOSIS — W19XXXA Unspecified fall, initial encounter: Secondary | ICD-10-CM

## 2017-03-31 DIAGNOSIS — J449 Chronic obstructive pulmonary disease, unspecified: Secondary | ICD-10-CM | POA: Insufficient documentation

## 2017-03-31 DIAGNOSIS — M25572 Pain in left ankle and joints of left foot: Secondary | ICD-10-CM | POA: Diagnosis present

## 2017-03-31 DIAGNOSIS — D696 Thrombocytopenia, unspecified: Secondary | ICD-10-CM | POA: Insufficient documentation

## 2017-03-31 DIAGNOSIS — Z79899 Other long term (current) drug therapy: Secondary | ICD-10-CM | POA: Diagnosis not present

## 2017-03-31 DIAGNOSIS — Y92009 Unspecified place in unspecified non-institutional (private) residence as the place of occurrence of the external cause: Secondary | ICD-10-CM

## 2017-03-31 LAB — CBC
HCT: 34.7 % — ABNORMAL LOW (ref 36.0–46.0)
HEMOGLOBIN: 11.1 g/dL — AB (ref 12.0–15.0)
MCH: 32.2 pg (ref 26.0–34.0)
MCHC: 32 g/dL (ref 30.0–36.0)
MCV: 100.6 fL — ABNORMAL HIGH (ref 78.0–100.0)
Platelets: 131 10*3/uL — ABNORMAL LOW (ref 150–400)
RBC: 3.45 MIL/uL — ABNORMAL LOW (ref 3.87–5.11)
RDW: 13.6 % (ref 11.5–15.5)
WBC: 5.6 10*3/uL (ref 4.0–10.5)

## 2017-03-31 LAB — DIFFERENTIAL
BASOS PCT: 0 %
Basophils Absolute: 0 10*3/uL (ref 0.0–0.1)
EOS ABS: 0.1 10*3/uL (ref 0.0–0.7)
EOS PCT: 3 %
Lymphocytes Relative: 54 %
Lymphs Abs: 3.1 10*3/uL (ref 0.7–4.0)
MONO ABS: 0.4 10*3/uL (ref 0.1–1.0)
Monocytes Relative: 8 %
NEUTROS ABS: 2 10*3/uL (ref 1.7–7.7)
Neutrophils Relative %: 35 %

## 2017-03-31 LAB — CBG MONITORING, ED: GLUCOSE-CAPILLARY: 80 mg/dL (ref 65–99)

## 2017-03-31 NOTE — ED Triage Notes (Signed)
Pt c/o pain to back and left hip and ankle after a fall earlier in the day; pt states she has been "blacking out a lot lately"; pt states she also had an episode of chest pain after she fell today located to the left of her chest with no radiation

## 2017-03-31 NOTE — ED Provider Notes (Addendum)
[facility] EMERGENCY DEPARTMENT Provider Note   CSN: [id number] Arrival date & time: 03/31/17  2254     History   Chief Complaint Chief Complaint  Patient presents with  . Fall    HPI Ashley Hall is a 71 y.o. female.  The history is provided by the patient.  She is a somewhat vague historian.  Significant history includes COPD, hypertension, seizures, bipolar disorder, chronic back pain.  She says that she fell off of her commode this morning.  She fell on her left side and is complaining of pain in her left ankle.  She also states that she hit her ahead.  She denies loss of consciousness.  She was unable to get off the floor.  She states that she has falls like this fairly frequently.  She also states that she had seen her PCP and had been told that her right knee was broken in 3 places.  She tells me that she has chronic pain and that her primary care physician had taken her off of Percocet and is giving her oxycodone, but she states oxycodone does not help her the way Percocet did.  She lives in an assisted living facility and does have help that stays with her 6 hours a day, but they have been on vacation for the last week.  Past Medical History:  Diagnosis Date  . Acid reflux   . Alcohol abuse, in remission   . Arthritis   . Bipolar 1 disorder ([place])   . Chronic back pain    Dr Merlene Laughter  . Chronic constipation   . COPD (chronic obstructive pulmonary disease) ([place])   . Depression   . ETOH abuse   . Hypertension   . Neurotic depression   . Personality disorder ([place])   . S/P colonoscopy 12/28/05   Dr Catalina Antigua prep, lipoma left colon  . 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 Seizures ([place])   . Sinus drainage     Patient Active Problem List   Diagnosis Date Noted  . Gastroesophageal reflux disease without esophagitis 06/13/2016  . Demand ischemia ([place]) 05/07/2014  . Altered mental state 05/06/2014  . Tobacco abuse 05/06/2014  . ETOH abuse  05/06/2014  . Drug overdose 05/06/2014  . Elevated troponin 05/06/2014  . CAP (community acquired pneumonia) 05/06/2014  . Lactic acidosis 05/06/2014  . Leukocytosis 05/06/2014  . Severe depression ([place]) 05/06/2014  . Insomnia 05/06/2014  . Hypothyroidism 05/06/2014  . GERD (gastroesophageal reflux disease) 05/06/2014  . Rhabdomyolysis 05/06/2014  . Physical deconditioning 05/06/2014  . Altered mental status   . Aspiration pneumonia ([place])   . Abnormal CT scan, pancreas or bile duct 11/25/2010  . Abdominal pain 11/25/2010  . ABNORMAL ELECTROCARDIOGRAM 06/16/2009  . B12 DEFICIENCY 06/15/2009  . NONDEPENDENT TOBACCO USE DISORDER 06/15/2009  . Essential hypertension 06/15/2009  . COPD (chronic obstructive pulmonary disease) ([place]) 06/15/2009  . SYNCOPE AND COLLAPSE 06/15/2009  . OTHER MALAISE AND FATIGUE 06/15/2009  . BACK PAIN 03/25/2007    Past Surgical History:  Procedure Laterality Date  . APPENDECTOMY    . complete hysterectomy    . ESOPHAGOGASTRODUODENOSCOPY N/A 06/16/2016   Procedure: ESOPHAGOGASTRODUODENOSCOPY (EGD);  Surgeon: Rogene Houston, MD;  Location: [facility];  Service: Endoscopy;  Laterality: N/A;  1200    OB History    No data available       Home Medications    Prior to Admission medications   Medication Sig Start Date End Date Taking? Authorizing Provider  ALPRAZolam (XANAX) 1 MG tablet Take 1 mg by mouth 4 (four) times daily.    [provider]  amLODipine (NORVASC) 5 MG tablet Take 5 mg by mouth daily.      [provider]  Ascorbic Acid (VITAMIN C) 1000 MG tablet Take 1,000 mg by mouth daily.      [provider]  B Complex-C (SUPER B COMPLEX PO) Take by mouth.    [provider]  DULoxetine (CYMBALTA) 60 MG capsule Take 60 mg by mouth daily.      [provider]  ferrous gluconate (FERGON) 325 MG tablet Take 325 mg by mouth daily with breakfast.      [provider]  fish oil-omega-3 fatty  acids 1000 MG capsule Take 2 g by mouth daily.      [provider]  gabapentin (NEURONTIN) 300 MG capsule Take 300 mg by mouth Daily. 11/04/10   [provider]  glucosamine-chondroitin 500-400 MG tablet Take 1 tablet by mouth 3 (three) times daily.    [provider]  levothyroxine (SYNTHROID, LEVOTHROID) 50 MCG tablet Take 50 mcg by mouth daily. 04/08/14   [provider]  magnesium 30 MG tablet Take 500 mg by mouth 2 (two) times daily.    [provider]  metoprolol tartrate (LOPRESSOR) 25 MG tablet Take 37.5 mg by mouth 2 (two) times daily.  11/16/10   [provider]  Multiple Vitamin (MULTIVITAMIN) capsule Take 1 capsule by mouth daily.      [provider]  oxyCODONE-acetaminophen (PERCOCET) 10-325 MG per tablet Take 1 tablet by mouth every 6 (six) hours as needed. Patient taking differently: Take 1 tablet by mouth every 6 (six) hours as needed for pain.  05/08/14   Lucia Gaskins, MD  pantoprazole (PROTONIX) 40 MG tablet TAKE ONE TABLET BY MOUTH TWICE DAILY. TAKE BEFORE A MEAL. (MORNING ,EVENING) 02/28/17   Rehman, Mechele Dawley, MD  pravastatin (PRAVACHOL) 40 MG tablet Take 1 tablet (40 mg total) by mouth at bedtime. 05/08/14   Lucia Gaskins, MD  PROAIR HFA 108 (90 BASE) MCG/ACT inhaler Inhale 2 puffs into the lungs 4 (four) times daily as needed for wheezing or shortness of breath.  09/15/14   [provider]  topiramate (TOPAMAX) 100 MG tablet Take 100 mg by mouth 2 (two) times daily.      [provider]  traZODone (DESYREL) 150 MG tablet Take 2 tablets by mouth at bedtime. 09/30/14   [provider]  vitamin A 10000 UNIT capsule Take 8,000 Units by mouth daily.    [provider]  vitamin E 200 UNIT capsule Take 400 Units by mouth daily.    [provider]    Family History Family History  Problem Relation Age of Onset  . Heart attack Mother        Deceased  . Diabetes Mother          Deceased  . Cancer Father        Deceased  . Schizophrenia Brother     Social History Social History  Substance Use Topics  . Smoking status: Heavy Tobacco Smoker    Packs/day: 0.50    Years: 48.00    Types: Cigarettes  . Smokeless tobacco: Never Used     Comment: smoke the fake cigarettes now  . Alcohol use Yes     Comment: heavy etoh x40yrs, quit 2004     Allergies   Pregabalin   Review of Systems Review of Systems  All other systems reviewed and are negative.    Physical Exam Updated Vital Signs BP (!) 120/48 (BP Location: Right Arm)   Pulse (!) 59   Temp 97.9 F (36.6 C) (Oral)   Resp (!) 8 Comment: edit, entered extra digit in earlier documentation  SpO2 98%   Physical Exam  Nursing note and vitals reviewed.  71 year old female, resting comfortably and in no acute distress. Vital signs are normal. Oxygen saturation is 98%, which is normal. Head is normocephalic and atraumatic. PERRLA, EOMI. Oropharynx is clear. Neck is nontender without adenopathy or JVD. Back is moderately tender diffusely.  There is no CVA tenderness. Lungs are clear without rales, wheezes, or rhonchi. Chest is nontender. Heart has regular rate and rhythm without murmur. Abdomen is soft, flat, nontender without masses or hepatosplenomegaly and peristalsis is normoactive. Extremities: There is no significant swelling.  There is mild to moderate tenderness in the posterior lateral aspect of the left ankle.  There is no ankle instability.  Full range of motion is present at the knees and hips. Skin is warm and dry without rash. Neurologic: Mental status is normal, cranial nerves are intact, there are no motor or sensory deficits.  ED Treatments / Results  Labs (all labs ordered are listed, but only abnormal results are displayed) Labs Reviewed  BASIC METABOLIC PANEL - Abnormal; Notable for the following components:      Result Value   Calcium 8.6 (*)    GFR calc non Af Amer 58  (*)    All other components within normal limits  CBC - Abnormal; Notable for the following components:   RBC 3.45 (*)    Hemoglobin 11.1 (*)    HCT 34.7 (*)    MCV 100.6 (*)    Platelets 131 (*)    All other components within normal limits  URINALYSIS, ROUTINE W REFLEX MICROSCOPIC - Abnormal; Notable for the following components:   Color, Urine STRAW (*)    All other components within normal limits  HEPATIC FUNCTION PANEL - Abnormal; Notable for the following components:   Total Protein 5.6 (*)    Albumin 2.9 (*)    ALT 11 (*)    Bilirubin, Direct <0.1 (*)    All other components within normal limits  URINE CULTURE  CK  DIFFERENTIAL  CBG MONITORING, ED    EKG  EKG Interpretation  Date/Time:  Saturday March 31 2017 23:04:43 EDT Ventricular Rate:  56 PR Interval:    QRS Duration: 99 QT Interval:  475 QTC Calculation: 459 R Axis:   -28 Text Interpretation:  Sinus bradycardia Borderline left axis deviation Low voltage, precordial leads Probable anteroseptal infarct, old When compared with ECG of 05/06/2014, HEART RATE has decreased Confirmed by Delora Fuel ([id number]) on 03/31/2017 11:18:17 PM       Radiology Dg Ankle Complete Left  Result Date: 04/01/2017 CLINICAL DATA:  Left ankle pain after fall earlier today. EXAM: LEFT ANKLE COMPLETE - 3+ VIEW COMPARISON:  None. FINDINGS: There is no evidence of fracture, dislocation, or joint effusion. There is no evidence of arthropathy or other focal bone abnormality. Soft tissues are unremarkable. The bones are under mineralized. IMPRESSION: Negative radiographs of the left ankle. Electronically Signed   By: Jeb Levering M.D.   On: 04/01/2017 00:34   Ct Head Wo Contrast  Result Date: 04/01/2017 CLINICAL DATA:  71 year old female with fall. EXAM: CT HEAD WITHOUT CONTRAST CT CERVICAL SPINE WITHOUT CONTRAST TECHNIQUE: Multidetector CT imaging of the head and cervical  spine was performed following the standard protocol without  intravenous contrast. Multiplanar CT image reconstructions of the cervical spine were also generated. COMPARISON:  Head CT dated 10/14/2016 FINDINGS: CT HEAD FINDINGS Brain: There is mild age-related atrophy and chronic microvascular ischemic changes. There is no acute intracranial hemorrhage. No mass effect or midline shift noted. No extra-axial fluid collection. Vascular: No hyperdense vessel or unexpected calcification. Skull: Normal. Negative for fracture or focal lesion. Sinuses/Orbits: No acute finding. Other: None CT CERVICAL SPINE FINDINGS Alignment: No acute subluxation. There is reversal of normal cervical lordosis at C5-C7. Skull base and vertebrae: No acute fracture. There is advanced osteopenia which limits evaluation for fracture. There is fusion of the C6-C7 vertebra. Soft tissues and spinal canal: No prevertebral fluid or swelling. No visible canal hematoma. Disc levels: Multilevel degenerative disc disease. C6-C7 vertebral fusion. Upper chest: Negative. Other: Bilateral carotid bulb calcified plaques. IMPRESSION: 1. No acute intracranial hemorrhage. 2. No acute/traumatic cervical spine pathology. Osteopenia with advanced degenerative changes of the cervical spine. Electronically Signed   By: Anner Crete M.D.   On: 04/01/2017 00:46   Ct Cervical Spine Wo Contrast  Result Date: 04/01/2017 CLINICAL DATA:  71 year old female with fall. EXAM: CT HEAD WITHOUT CONTRAST CT CERVICAL SPINE WITHOUT CONTRAST TECHNIQUE: Multidetector CT imaging of the head and cervical spine was performed following the standard protocol without intravenous contrast. Multiplanar CT image reconstructions of the cervical spine were also generated. COMPARISON:  Head CT dated 10/14/2016 FINDINGS: CT HEAD FINDINGS Brain: There is mild age-related atrophy and chronic microvascular ischemic changes. There is no acute intracranial hemorrhage. No mass effect or midline shift noted. No extra-axial fluid collection. Vascular: No  hyperdense vessel or unexpected calcification. Skull: Normal. Negative for fracture or focal lesion. Sinuses/Orbits: No acute finding. Other: None CT CERVICAL SPINE FINDINGS Alignment: No acute subluxation. There is reversal of normal cervical lordosis at C5-C7. Skull base and vertebrae: No acute fracture. There is advanced osteopenia which limits evaluation for fracture. There is fusion of the C6-C7 vertebra. Soft tissues and spinal canal: No prevertebral fluid or swelling. No visible canal hematoma. Disc levels: Multilevel degenerative disc disease. C6-C7 vertebral fusion. Upper chest: Negative. Other: Bilateral carotid bulb calcified plaques. IMPRESSION: 1. No acute intracranial hemorrhage. 2. No acute/traumatic cervical spine pathology. Osteopenia with advanced degenerative changes of the cervical spine. Electronically Signed   By: Anner Crete M.D.   On: 04/01/2017 00:46   Dg Knee Complete 4 Views Right  Result Date: 04/01/2017 CLINICAL DATA:  Right knee pain after fall earlier today. EXAM: RIGHT KNEE - COMPLETE 4+ VIEW COMPARISON:  None. FINDINGS: No evidence of fracture, dislocation, or joint effusion. Minimal osteoarthritis with peripheral spurring. Joint spaces are maintained. The bones are under mineralized. Soft tissues are unremarkable. IMPRESSION: No fracture or subluxation of the right knee. Electronically Signed   By: Jeb Levering M.D.   On: 04/01/2017 00:34   Dg Hip Unilat W Or Wo Pelvis 2-3 Views Left  Result Date: 04/01/2017 CLINICAL DATA:  Left hip pain after fall earlier today. EXAM: DG HIP (WITH OR WITHOUT PELVIS) 2-3V LEFT COMPARISON:  Pelvis and right hip radiographs 08/24/2016 FINDINGS: The cortical margins of the bony pelvis and left hip are intact. No fracture. Pubic symphysis and sacroiliac joints are congruent. Both femoral heads are well-seated in the respective acetabula. Age related degenerative change of both hips. The bones are under mineralized. IMPRESSION: No  fracture of the pelvis or left hip. Electronically Signed   By: Fonnie Birkenhead.D.  On: 04/01/2017 00:33    Procedures Procedures (including critical care time)  Medications Ordered in ED Medications  cephALEXin (KEFLEX) 500 MG capsule (500 mg  Given 04/01/17 0346)     Initial Impression / Assessment and Plan / ED Course  I have reviewed the triage vital signs and the nursing notes.  Pertinent labs & imaging results that were available during my care of the patient were reviewed by me and considered in my medical decision making (see chart for details).  The fall without evidence of significant injury.  She will be sent for x-rays.  We will also x-ray her right knee which she states is supposed to be broken in 3 places, but seems normal on my exam.  Because of reports of multiple falls, will check screening labs.  Old records are reviewed, and she does have prior ED visit for falls.  Also, an ED visit for altered mentation due to coingestion of alcohol and benzodiazepines.  X-rays are negative for fracture, including her knee which she states she was told was broken in 3 places.  Laboratory workup shows mild anemia which is slightly worse than it had been when last checked, but that was 3 years ago.  Mild thrombocytopenia is also present.  These could be related to ongoing alcohol abuse.  Patient is advised of these findings and is referred back to her PCP for follow-up.  Also, please note that urinalysis report is normal, but a report was sent while Epic was in downtime.  That report had stated positive nitrite.  Based on that report, patient was sent home with prescription for cephalexin and urine sent for culture.  Final Clinical Impressions(s) / ED Diagnoses   Final diagnoses:  Fall in home, initial encounter  Macrocytic anemia  Thrombocytopenia ([place])    New Prescriptions This SmartLink is deprecated. Use AVSMEDLIST instead to display the medication list for a patient.   Delora Fuel, MD 78/58/85 [id number]    Delora Fuel, MD 41/28/78 [phone]

## 2017-04-01 DIAGNOSIS — M25572 Pain in left ankle and joints of left foot: Secondary | ICD-10-CM | POA: Diagnosis not present

## 2017-04-01 LAB — CK: CK TOTAL: 192 U/L (ref 38–234)

## 2017-04-01 LAB — URINALYSIS, ROUTINE W REFLEX MICROSCOPIC
BILIRUBIN URINE: NEGATIVE
Glucose, UA: NEGATIVE mg/dL
Hgb urine dipstick: NEGATIVE
KETONES UR: NEGATIVE mg/dL
Leukocytes, UA: NEGATIVE
NITRITE: NEGATIVE
Protein, ur: NEGATIVE mg/dL
SPECIFIC GRAVITY, URINE: 1.005 (ref 1.005–1.030)
pH: 6 (ref 5.0–8.0)

## 2017-04-01 LAB — BASIC METABOLIC PANEL
ANION GAP: 5 (ref 5–15)
BUN: 8 mg/dL (ref 6–20)
CALCIUM: 8.6 mg/dL — AB (ref 8.9–10.3)
CO2: 25 mmol/L (ref 22–32)
Chloride: 110 mmol/L (ref 101–111)
Creatinine, Ser: 0.97 mg/dL (ref 0.44–1.00)
GFR, EST NON AFRICAN AMERICAN: 58 mL/min — AB (ref >60)
GLUCOSE: 72 mg/dL (ref 65–99)
Potassium: 3.9 mmol/L (ref 3.5–5.1)
SODIUM: 140 mmol/L (ref 135–145)

## 2017-04-01 LAB — HEPATIC FUNCTION PANEL
ALT: 11 U/L — AB (ref 14–54)
AST: 22 U/L (ref 15–41)
Albumin: 2.9 g/dL — ABNORMAL LOW (ref 3.5–5.0)
Alkaline Phosphatase: 92 U/L (ref 38–126)
Bilirubin, Direct: <0.1 mg/dL — ABNORMAL LOW (ref 0.1–0.5)
TOTAL PROTEIN: 5.6 g/dL — AB (ref 6.5–8.1)
Total Bilirubin: 0.3 mg/dL (ref 0.3–1.2)

## 2017-04-01 MED ORDER — CEPHALEXIN 500 MG PO CAPS
ORAL_CAPSULE | ORAL | Status: AC
  Administered 2017-04-01: 500 mg
  Filled 2017-04-01: qty 1

## 2017-04-01 NOTE — ED Notes (Signed)
Pt does not have a ride at this time pt being allowed to sleep in am

## 2017-04-02 LAB — URINE CULTURE: CULTURE: NO GROWTH

## 2017-08-14 ENCOUNTER — Other Ambulatory Visit (INDEPENDENT_AMBULATORY_CARE_PROVIDER_SITE_OTHER): Payer: Self-pay | Admitting: Internal Medicine

## 2017-12-20 ENCOUNTER — Other Ambulatory Visit: Payer: Self-pay

## 2017-12-20 ENCOUNTER — Emergency Department (HOSPITAL_COMMUNITY): Payer: Medicare Other

## 2017-12-20 ENCOUNTER — Emergency Department (HOSPITAL_COMMUNITY)
Admission: EM | Admit: 2017-12-20 | Discharge: 2017-12-20 | Disposition: A | Discharge status: Home / Self Care | Payer: Medicare Other | Attending: Emergency Medicine | Admitting: Emergency Medicine

## 2017-12-20 ENCOUNTER — Encounter (HOSPITAL_COMMUNITY): Payer: Self-pay | Admitting: *Deleted

## 2017-12-20 DIAGNOSIS — Z79899 Other long term (current) drug therapy: Secondary | ICD-10-CM | POA: Insufficient documentation

## 2017-12-20 DIAGNOSIS — R0789 Other chest pain: Secondary | ICD-10-CM

## 2017-12-20 DIAGNOSIS — S0990XA Unspecified injury of head, initial encounter: Secondary | ICD-10-CM | POA: Insufficient documentation

## 2017-12-20 DIAGNOSIS — Y9384 Activity, sleeping: Secondary | ICD-10-CM | POA: Diagnosis not present

## 2017-12-20 DIAGNOSIS — J449 Chronic obstructive pulmonary disease, unspecified: Secondary | ICD-10-CM | POA: Diagnosis not present

## 2017-12-20 DIAGNOSIS — I10 Essential (primary) hypertension: Secondary | ICD-10-CM | POA: Insufficient documentation

## 2017-12-20 DIAGNOSIS — E876 Hypokalemia: Secondary | ICD-10-CM | POA: Diagnosis not present

## 2017-12-20 DIAGNOSIS — Y92013 Bedroom of single-family (private) house as the place of occurrence of the external cause: Secondary | ICD-10-CM | POA: Insufficient documentation

## 2017-12-20 DIAGNOSIS — Z7902 Long term (current) use of antithrombotics/antiplatelets: Secondary | ICD-10-CM | POA: Insufficient documentation

## 2017-12-20 DIAGNOSIS — R55 Syncope and collapse: Secondary | ICD-10-CM | POA: Insufficient documentation

## 2017-12-20 DIAGNOSIS — W19XXXA Unspecified fall, initial encounter: Secondary | ICD-10-CM | POA: Insufficient documentation

## 2017-12-20 DIAGNOSIS — E039 Hypothyroidism, unspecified: Secondary | ICD-10-CM | POA: Insufficient documentation

## 2017-12-20 DIAGNOSIS — F17210 Nicotine dependence, cigarettes, uncomplicated: Secondary | ICD-10-CM | POA: Diagnosis not present

## 2017-12-20 DIAGNOSIS — M25551 Pain in right hip: Secondary | ICD-10-CM | POA: Diagnosis not present

## 2017-12-20 DIAGNOSIS — Y998 Other external cause status: Secondary | ICD-10-CM | POA: Diagnosis not present

## 2017-12-20 LAB — COMPREHENSIVE METABOLIC PANEL
ALBUMIN: 2.9 g/dL — AB (ref 3.5–5.0)
ALT: 10 U/L (ref 0–44)
AST: 24 U/L (ref 15–41)
Alkaline Phosphatase: 84 U/L (ref 38–126)
Anion gap: 9 (ref 5–15)
CHLORIDE: 108 mmol/L (ref 98–111)
CO2: 25 mmol/L (ref 22–32)
CREATININE: 0.74 mg/dL (ref 0.44–1.00)
Calcium: 8.1 mg/dL — ABNORMAL LOW (ref 8.9–10.3)
GFR calc Af Amer: >60 mL/min (ref >60)
GLUCOSE: 91 mg/dL (ref 70–99)
POTASSIUM: 2.4 mmol/L — AB (ref 3.5–5.1)
SODIUM: 142 mmol/L (ref 135–145)
Total Bilirubin: 0.4 mg/dL (ref 0.3–1.2)
Total Protein: 6 g/dL — ABNORMAL LOW (ref 6.5–8.1)

## 2017-12-20 LAB — CBC WITH DIFFERENTIAL/PLATELET
BASOS ABS: 0 10*3/uL (ref 0.0–0.1)
BASOS PCT: 0 %
EOS PCT: 1 %
Eosinophils Absolute: 0.1 10*3/uL (ref 0.0–0.7)
HCT: 36.8 % (ref 36.0–46.0)
Hemoglobin: 12.1 g/dL (ref 12.0–15.0)
LYMPHS PCT: 27 %
Lymphs Abs: 1.9 10*3/uL (ref 0.7–4.0)
MCH: 32.2 pg (ref 26.0–34.0)
MCHC: 32.9 g/dL (ref 30.0–36.0)
MCV: 97.9 fL (ref 78.0–100.0)
Monocytes Absolute: 0.7 10*3/uL (ref 0.1–1.0)
Monocytes Relative: 9 %
NEUTROS ABS: 4.4 10*3/uL (ref 1.7–7.7)
Neutrophils Relative %: 63 %
PLATELETS: 237 10*3/uL (ref 150–400)
RBC: 3.76 MIL/uL — AB (ref 3.87–5.11)
RDW: 14.2 % (ref 11.5–15.5)
WBC: 7.1 10*3/uL (ref 4.0–10.5)

## 2017-12-20 LAB — TROPONIN I

## 2017-12-20 MED ORDER — POTASSIUM CHLORIDE CRYS ER 20 MEQ PO TBCR: 20.0000 meq | EXTENDED_RELEASE_TABLET | Freq: Two times a day (BID) | ORAL | 0 refills | Status: DC

## 2017-12-20 MED ORDER — OXYCODONE-ACETAMINOPHEN 5-325 MG PO TABS: 1.0000 | ORAL_TABLET | ORAL | 0 refills | Status: DC | PRN

## 2017-12-20 MED ORDER — POTASSIUM CHLORIDE 20 MEQ PO PACK
20.0000 meq | PACK | Freq: Once | ORAL | Status: AC
  Administered 2017-12-20: 20 meq via ORAL
  Filled 2017-12-20: qty 1

## 2017-12-20 MED ORDER — SODIUM CHLORIDE 0.9 % IV BOLUS: 500.0000 mL | Freq: Once | INTRAVENOUS | Status: DC

## 2017-12-20 MED ORDER — ALPRAZOLAM 0.5 MG PO TABS
0.5000 mg | ORAL_TABLET | Freq: Once | ORAL | Status: AC
  Administered 2017-12-20: 0.5 mg via ORAL
  Filled 2017-12-20: qty 1

## 2017-12-20 MED ORDER — POTASSIUM CHLORIDE 20 MEQ PO PACK
40.0000 meq | PACK | Freq: Once | ORAL | Status: AC
  Administered 2017-12-20: 40 meq via ORAL
  Filled 2017-12-20: qty 2

## 2017-12-20 MED ORDER — ALPRAZOLAM 0.5 MG PO TABS: 0.5000 mg | ORAL_TABLET | Freq: Every evening | ORAL | 0 refills | Status: DC | PRN

## 2017-12-20 NOTE — ED Provider Notes (Signed)
[facility] EMERGENCY DEPARTMENT Provider Note   CSN: [id number] Arrival date & time: 12/20/17  1538     History   Chief Complaint Chief Complaint  Patient presents with  . Migraine    HPI Ashley Hall is a 71 y.o. female.  Level 5 caveat for amnesia to event.  Patient stated she had a "seizure last night" and had fallen on the floor.  She eventually woke up and lifted herself off the floor and got into bed.  She woke up this morning with a headache.  A nurse's aide came to her home at 10 AM today and noticed some skin tears on her arm.  EMS was then notified.  No one witnessed this event.  Past medical history is complex and includes bipolar, COPD, depression, alcohol abuse, hypertension, several others.  No chest pain, dyspnea, cough, fever, sweats, chills, new neurological deficits, dysuria.     Past Medical History:  Diagnosis Date  . Acid reflux   . Alcohol abuse, in remission   . Arthritis   . Bipolar 1 disorder ([place])   . Chronic back pain    Dr Merlene Laughter  . Chronic constipation   . COPD (chronic obstructive pulmonary disease) ([place])   . Depression   . ETOH abuse   . Hypertension   . Neurotic depression   . Personality disorder ([place])   . S/P colonoscopy 12/28/05   Dr Catalina Antigua prep, lipoma left colon  . 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 Seizures ([place])   . Sinus drainage     Patient Active Problem List   Diagnosis Date Noted  . Gastroesophageal reflux disease without esophagitis 06/13/2016  . Demand ischemia ([place]) 05/07/2014  . Altered mental state 05/06/2014  . Tobacco abuse 05/06/2014  . ETOH abuse 05/06/2014  . Drug overdose 05/06/2014  . Elevated troponin 05/06/2014  . CAP (community acquired pneumonia) 05/06/2014  . Lactic acidosis 05/06/2014  . Leukocytosis 05/06/2014  . Severe depression ([place]) 05/06/2014  . Insomnia 05/06/2014  . Hypothyroidism 05/06/2014  . GERD (gastroesophageal reflux disease) 05/06/2014  .  Rhabdomyolysis 05/06/2014  . Physical deconditioning 05/06/2014  . Altered mental status   . Aspiration pneumonia ([place])   . Abnormal CT scan, pancreas or bile duct 11/25/2010  . Abdominal pain 11/25/2010  . ABNORMAL ELECTROCARDIOGRAM 06/16/2009  . B12 DEFICIENCY 06/15/2009  . NONDEPENDENT TOBACCO USE DISORDER 06/15/2009  . Essential hypertension 06/15/2009  . COPD (chronic obstructive pulmonary disease) ([place]) 06/15/2009  . SYNCOPE AND COLLAPSE 06/15/2009  . OTHER MALAISE AND FATIGUE 06/15/2009  . BACK PAIN 03/25/2007    Past Surgical History:  Procedure Laterality Date  . APPENDECTOMY    . complete hysterectomy    . ESOPHAGOGASTRODUODENOSCOPY N/A 06/16/2016   Procedure: ESOPHAGOGASTRODUODENOSCOPY (EGD);  Surgeon: Rogene Houston, MD;  Location: [facility] ENDO SUITE;  Service: Endoscopy;  Laterality: N/A;  1200     OB History   None      Home Medications    Prior to Admission medications   Medication Sig Start Date End Date Taking? Authorizing Provider  amLODipine (NORVASC) 5 MG tablet Take 5 mg by mouth daily.     Yes [provider]  Ascorbic Acid (VITAMIN C) 1000 MG tablet Take 1,000 mg by mouth daily.     Yes [provider]  B Complex-C (SUPER B COMPLEX PO) Take by mouth.   Yes [provider]  ferrous gluconate (FERGON) 325 MG tablet Take 325 mg by mouth daily with breakfast.  Yes [provider]  fish oil-omega-3 fatty acids 1000 MG capsule Take 2 g by mouth daily.     Yes [provider]  gabapentin (NEURONTIN) 300 MG capsule Take 300 mg by mouth Daily. 11/04/10  Yes [provider]  levothyroxine (SYNTHROID, LEVOTHROID) 50 MCG tablet Take 50 mcg by mouth daily. 04/08/14  Yes [provider]  magnesium 30 MG tablet Take 500 mg by mouth 2 (two) times daily.   Yes [provider]  metoprolol tartrate (LOPRESSOR) 25 MG tablet Take 37.5 mg by mouth 2 (two) times daily.  11/16/10  Yes [provider]    Multiple Vitamin (MULTIVITAMIN) capsule Take 1 capsule by mouth daily.     Yes [provider]  pantoprazole (PROTONIX) 40 MG tablet TAKE ONE TABLET BY MOUTH TWICE DAILY. TAKE BEFORE A MEAL. (MORNING ,EVENING) 08/14/17  Yes Setzer, Terri L, NP  PARoxetine (PAXIL) 20 MG tablet Take 1 tablet by mouth daily. 11/30/17  Yes [provider]  pravastatin (PRAVACHOL) 40 MG tablet Take 1 tablet (40 mg total) by mouth at bedtime. 05/08/14  Yes Lucia Gaskins, MD  topiramate (TOPAMAX) 100 MG tablet Take 100 mg by mouth 2 (two) times daily.     Yes [provider]  traZODone (DESYREL) 150 MG tablet Take 2 tablets by mouth at bedtime. 09/30/14  Yes [provider]  vitamin A 10000 UNIT capsule Take 8,000 Units by mouth daily.   Yes [provider]  vitamin E 200 UNIT capsule Take 400 Units by mouth daily.   Yes [provider]  ALPRAZolam Duanne Moron) 0.5 MG tablet Take 1 tablet (0.5 mg total) by mouth at bedtime as needed for anxiety. 12/20/17   Nat Christen, MD  Aspirin-Salicylamide-Caffeine 469-629-52.8 MG PACK Take 1 Package by mouth as needed.    [provider]  ibuprofen (ADVIL,MOTRIN) 400 MG tablet TAKE TWO (2) TABLETS BY MOUTH EVERY 6 TO 8 HOURS AS NEEDED. 10/15/17   [provider]  oxyCODONE-acetaminophen (PERCOCET/ROXICET) 5-325 MG tablet Take 1 tablet by mouth every 4 (four) hours as needed for severe pain. 12/20/17   Nat Christen, MD  potassium chloride SA (K-DUR,KLOR-CON) 20 MEQ tablet Take 1 tablet (20 mEq total) by mouth 2 (two) times daily. 12/20/17   Nat Christen, MD  PROAIR HFA [id number] BASE) MCG/ACT inhaler Inhale 2 puffs into the lungs 4 (four) times daily as needed for wheezing or shortness of breath.  09/15/14   [provider]    Family History Family History  Problem Relation Age of Onset  . Heart attack Mother        Deceased  . Diabetes Mother        Deceased  . Cancer Father        Deceased  . Schizophrenia  Brother     Social History Social History   Tobacco Use  . Smoking status: Heavy Tobacco Smoker    Packs/day: 0.50    Years: 48.00    Pack years: 24.00    Types: Cigarettes  . Smokeless tobacco: Never Used  . Tobacco comment: smoke the fake cigarettes now  Substance Use Topics  . Alcohol use: Yes    Comment: heavy etoh x68yrs, quit 2004  . Drug use: No    Frequency: 5.0 times per week     Allergies   Pregabalin   Review of Systems Review of Systems  All other systems reviewed and are negative.    Physical Exam Updated Vital Signs BP Marland Kitchen)  112/45 (BP Location: Right Arm)   Pulse 73   Temp 97.7 F (36.5 C) (Oral)   Resp 16   Ht 5\' 2"  (1.575 m)   Wt 54.4 kg (120 lb)   SpO2 99%   BMI 21.95 kg/m   Physical Exam  Constitutional: She is oriented to person, place, and time. She appears well-developed and well-nourished.  HENT:  Head: Normocephalic and atraumatic.  Eyes: Conjunctivae are normal.  Neck: Neck supple.  Cardiovascular: Normal rate and regular rhythm.  Pulmonary/Chest: Effort normal and breath sounds normal.  Tender right anterior lateral ribs.  Abdominal: Soft. Bowel sounds are normal.  Musculoskeletal:  Tender right hip  Neurological: She is alert and oriented to person, place, and time.  Moving all extremities.  Skin:  Skin tears on arms right greater than left.  Psychiatric:  Flat affect  Nursing note and vitals reviewed.    ED Treatments / Results  Labs (all labs ordered are listed, but only abnormal results are displayed) Labs Reviewed  CBC WITH DIFFERENTIAL/PLATELET - Abnormal; Notable for the following components:      Result Value   RBC 3.76 (*)    All other components within normal limits  COMPREHENSIVE METABOLIC PANEL - Abnormal; Notable for the following components:   Potassium 2.4 (*)    BUN <5 (*)    Calcium 8.1 (*)    Total Protein 6.0 (*)    Albumin 2.9 (*)    All other components within normal limits  TROPONIN I     EKG EKG Interpretation  Date/Time:  Thursday December 20 2017 17:18:51 EDT Ventricular Rate:  78 PR Interval:    QRS Duration: 100 QT Interval:  405 QTC Calculation: 462 R Axis:   -18 Text Interpretation:  Sinus rhythm Borderline left axis deviation Abnormal R-wave progression, early transition Borderline T abnormalities, anterior leads Confirmed by Nat Christen [phone]) on 12/20/2017 6:53:09 PM   Radiology Dg Ribs Unilateral W/chest Right  Result Date: 12/20/2017 CLINICAL DATA:  Seizure.  Anterior right rib pain EXAM: RIGHT RIBS AND CHEST - 3+ VIEW COMPARISON:  12/11/2017 FINDINGS: Mild hyperinflation of the lungs. No visible right rib fracture. Mild bibasilar atelectasis. No effusions. No pneumothorax. Previously seen left upper lobe nodule not visualized on today's study. IMPRESSION: No visible rib fracture, effusion or pneumothorax. Bibasilar atelectasis. Electronically Signed   By: Rolm Baptise M.D.   On: 12/20/2017 18:51   Ct Head Wo Contrast  Result Date: 12/20/2017 CLINICAL DATA:  Seizure last night. Headache this morning. Fell with resultant right parietal head wounds. EXAM: CT HEAD WITHOUT CONTRAST TECHNIQUE: Contiguous axial images were obtained from the base of the skull through the vertex without intravenous contrast. COMPARISON:  05/11/2017. FINDINGS: Brain: Diffusely enlarged ventricles and subarachnoid spaces. Patchy white matter low density in both cerebral hemispheres. No intracranial hemorrhage, mass lesion or CT evidence of acute infarction. Vascular: No hyperdense vessel or unexpected calcification. Skull: Normal. Negative for fracture or focal lesion. Sinuses/Orbits: Unremarkable. Other: None. IMPRESSION: 1. No acute abnormality. 2. Stable diffuse cerebral and cerebellar atrophy and chronic small vessel white matter ischemic changes in both cerebral hemispheres. Electronically Signed   By: Claudie Revering M.D.   On: 12/20/2017 19:01   Dg Hip Unilat W Or Wo Pelvis 2-3 Views  Right  Result Date: 12/20/2017 CLINICAL DATA:  Seizure last night.  Fall. EXAM: DG HIP (WITH OR WITHOUT PELVIS) 2-3V RIGHT COMPARISON:  None. FINDINGS: No acute fracture.  No dislocation.  Osteopenia. IMPRESSION: No acute bony pathology. Electronically  Signed   By: Marybelle Killings M.D.   On: 12/20/2017 18:49    Procedures Procedures (including critical care time)  Medications Ordered in ED Medications  ALPRAZolam Duanne Moron) tablet 0.5 mg (0.5 mg Oral Given 12/20/17 1804)  potassium chloride (KLOR-CON) packet 40 mEq (40 mEq Oral Given 12/20/17 2003)  potassium chloride (KLOR-CON) packet 20 mEq (20 mEq Oral Given 12/20/17 2202)     Initial Impression / Assessment and Plan / ED Course  I have reviewed the triage vital signs and the nursing notes.  Pertinent labs & imaging results that were available during my care of the patient were reviewed by me and considered in my medical decision making (see chart for details).     Patient allegedly had a "seizure" at home last night, but this was unwitnessed.  She has normal behavior in the emergency department.  Potassium is 2.4.  Plain films of right ribs, right hip, CT head show no acute findings.  Potassium was replaced orally in the emergency department (60 mEq).  Patient did not want to stay in the hospital.  She was observed for several hours with no change in her neurological status.  Will discharge home with potassium replacement, Xanax 0.5 mg (#6), Percocet (#6).  Test results were discussed with the patient in detail.  She has primary care follow-up.  Final Clinical Impressions(s) / ED Diagnoses   Final diagnoses:  Fall, initial encounter  Minor head injury, initial encounter  Right hip pain  Right-sided chest wall pain  Hypokalemia    ED Discharge Orders        Ordered    oxyCODONE-acetaminophen (PERCOCET/ROXICET) 5-325 MG tablet  Every 4 hours PRN     12/20/17 2214    ALPRAZolam (XANAX) 0.5 MG tablet  At bedtime PRN     12/20/17 2214     potassium chloride SA (K-DUR,KLOR-CON) 20 MEQ tablet  2 times daily,   Status:  Discontinued     12/20/17 2214    potassium chloride SA (K-DUR,KLOR-CON) 20 MEQ tablet  2 times daily     12/20/17 2217       Nat Christen, MD 12/21/17 1317

## 2017-12-20 NOTE — Discharge Instructions (Addendum)
X-ray showed no abnormalities.  Your potassium is low.  Prescription for potassium replacement.  This will need to be rechecked in 1 week.  Small prescription for pain and anxiety medicine

## 2017-12-20 NOTE — ED Notes (Signed)
Pt refused an EKG until she gets a nerve pill.

## 2017-12-20 NOTE — ED Notes (Signed)
Date and time results received: 12/20/17 1928 (use smartphrase ".now" to insert current time)  Test: Potassium Critical Value: 2.4  Name of Provider Notified: Dr Lacinda Axon  Orders Received? Or Actions Taken?:

## 2017-12-20 NOTE — ED Triage Notes (Signed)
Patient stated she had a "seizure last night" and had fallen and lost consciousness and had woken up "several hours later", had slid across the floor, gotten up by herself and went to bed.  Patient stated had woken up this morning with a headache.  Patients aid came over at 10 am to dress wounds patient sustained from fall last night.  Patient aid then called rescue services.

## 2017-12-20 NOTE — ED Notes (Signed)
Pt was informed that we need a urine sample. Pt states that she can not urinate at this time. 

## 2017-12-20 NOTE — ED Notes (Signed)
Pt to bathroom. Primary nurse will triage in back and move to room 8

## 2018-01-24 ENCOUNTER — Other Ambulatory Visit (INDEPENDENT_AMBULATORY_CARE_PROVIDER_SITE_OTHER): Payer: Self-pay | Admitting: Internal Medicine

## 2018-02-12 ENCOUNTER — Encounter (INDEPENDENT_AMBULATORY_CARE_PROVIDER_SITE_OTHER): Payer: Self-pay | Admitting: *Deleted

## 2018-03-18 ENCOUNTER — Encounter (HOSPITAL_COMMUNITY): Payer: Self-pay | Admitting: Emergency Medicine

## 2018-03-18 ENCOUNTER — Other Ambulatory Visit: Payer: Self-pay

## 2018-03-18 ENCOUNTER — Emergency Department (HOSPITAL_COMMUNITY): Payer: Medicare Other

## 2018-03-18 ENCOUNTER — Emergency Department (HOSPITAL_COMMUNITY)
Admission: EM | Admit: 2018-03-18 | Discharge: 2018-03-18 | Disposition: A | Discharge status: Home / Self Care | Payer: Medicare Other | Attending: Emergency Medicine | Admitting: Emergency Medicine

## 2018-03-18 DIAGNOSIS — E039 Hypothyroidism, unspecified: Secondary | ICD-10-CM | POA: Insufficient documentation

## 2018-03-18 DIAGNOSIS — F10229 Alcohol dependence with intoxication, unspecified: Secondary | ICD-10-CM | POA: Insufficient documentation

## 2018-03-18 DIAGNOSIS — Y906 Blood alcohol level of 120-199 mg/100 ml: Secondary | ICD-10-CM | POA: Insufficient documentation

## 2018-03-18 DIAGNOSIS — S40021A Contusion of right upper arm, initial encounter: Secondary | ICD-10-CM

## 2018-03-18 DIAGNOSIS — Z79899 Other long term (current) drug therapy: Secondary | ICD-10-CM | POA: Diagnosis not present

## 2018-03-18 DIAGNOSIS — Y939 Activity, unspecified: Secondary | ICD-10-CM | POA: Diagnosis not present

## 2018-03-18 DIAGNOSIS — J449 Chronic obstructive pulmonary disease, unspecified: Secondary | ICD-10-CM | POA: Diagnosis not present

## 2018-03-18 DIAGNOSIS — S40011A Contusion of right shoulder, initial encounter: Secondary | ICD-10-CM | POA: Insufficient documentation

## 2018-03-18 DIAGNOSIS — W19XXXA Unspecified fall, initial encounter: Secondary | ICD-10-CM | POA: Insufficient documentation

## 2018-03-18 DIAGNOSIS — F17210 Nicotine dependence, cigarettes, uncomplicated: Secondary | ICD-10-CM | POA: Insufficient documentation

## 2018-03-18 DIAGNOSIS — S4991XA Unspecified injury of right shoulder and upper arm, initial encounter: Secondary | ICD-10-CM | POA: Diagnosis present

## 2018-03-18 DIAGNOSIS — R51 Headache: Secondary | ICD-10-CM | POA: Diagnosis not present

## 2018-03-18 DIAGNOSIS — Y92009 Unspecified place in unspecified non-institutional (private) residence as the place of occurrence of the external cause: Secondary | ICD-10-CM | POA: Diagnosis not present

## 2018-03-18 DIAGNOSIS — Y999 Unspecified external cause status: Secondary | ICD-10-CM | POA: Insufficient documentation

## 2018-03-18 DIAGNOSIS — F10920 Alcohol use, unspecified with intoxication, uncomplicated: Secondary | ICD-10-CM

## 2018-03-18 LAB — CBC WITH DIFFERENTIAL/PLATELET
Abs Immature Granulocytes: 0.01 10*3/uL (ref 0.00–0.07)
BASOS ABS: 0 10*3/uL (ref 0.0–0.1)
Basophils Relative: 0 %
EOS PCT: 1 %
Eosinophils Absolute: 0 10*3/uL (ref 0.0–0.5)
HEMATOCRIT: 41.2 % (ref 36.0–46.0)
HEMOGLOBIN: 13.1 g/dL (ref 12.0–15.0)
Immature Granulocytes: 0 %
Lymphocytes Relative: 40 %
Lymphs Abs: 3.2 10*3/uL (ref 0.7–4.0)
MCH: 32.6 pg (ref 26.0–34.0)
MCHC: 31.8 g/dL (ref 30.0–36.0)
MCV: 102.5 fL — ABNORMAL HIGH (ref 80.0–100.0)
Monocytes Absolute: 0.6 10*3/uL (ref 0.1–1.0)
Monocytes Relative: 8 %
NEUTROS ABS: 4.1 10*3/uL (ref 1.7–7.7)
NRBC: 0 % (ref 0.0–0.2)
Neutrophils Relative %: 51 %
Platelets: 132 10*3/uL — ABNORMAL LOW (ref 150–400)
RBC: 4.02 MIL/uL (ref 3.87–5.11)
RDW: 13.4 % (ref 11.5–15.5)
WBC: 8 10*3/uL (ref 4.0–10.5)

## 2018-03-18 LAB — COMPREHENSIVE METABOLIC PANEL
ALBUMIN: 3.3 g/dL — AB (ref 3.5–5.0)
ALK PHOS: 97 U/L (ref 38–126)
ALT: 26 U/L (ref 0–44)
ANION GAP: 4 — AB (ref 5–15)
AST: 62 U/L — AB (ref 15–41)
BILIRUBIN TOTAL: 0.6 mg/dL (ref 0.3–1.2)
CO2: 25 mmol/L (ref 22–32)
Calcium: 8.6 mg/dL — ABNORMAL LOW (ref 8.9–10.3)
Chloride: 115 mmol/L — ABNORMAL HIGH (ref 98–111)
Creatinine, Ser: 0.86 mg/dL (ref 0.44–1.00)
GFR calc Af Amer: >60 mL/min (ref >60)
GFR calc non Af Amer: >60 mL/min (ref >60)
GLUCOSE: 107 mg/dL — AB (ref 70–99)
POTASSIUM: 3.1 mmol/L — AB (ref 3.5–5.1)
SODIUM: 144 mmol/L (ref 135–145)
Total Protein: 6.5 g/dL (ref 6.5–8.1)

## 2018-03-18 LAB — ETHANOL: Alcohol, Ethyl (B): 145 mg/dL — ABNORMAL HIGH (ref <10)

## 2018-03-18 MED ORDER — POTASSIUM CHLORIDE CRYS ER 20 MEQ PO TBCR
40.0000 meq | EXTENDED_RELEASE_TABLET | Freq: Once | ORAL | Status: AC
  Administered 2018-03-18: 40 meq via ORAL
  Filled 2018-03-18: qty 2

## 2018-03-18 NOTE — ED Notes (Signed)
Spoke to patient's niece Raye Sorrow, she is going to try and find a ride home for patient.

## 2018-03-18 NOTE — ED Notes (Signed)
Patient asking for food, given dinner tray and drink.

## 2018-03-18 NOTE — ED Provider Notes (Signed)
[facility] EMERGENCY DEPARTMENT Provider Note   CSN: [id number] Arrival date & time: 03/18/18  0104  Time seen 01:38 AM   History   Chief Complaint Chief Complaint  Patient presents with  . Altered Mental Status   Level 5 caveat for altered mental status  HPI Ashley Hall is a 72 y.o. female.  HPI patient was brought to emergency department by EMS.  They state the patient called for their services 3 times today.  Twice she complained of falls however when they arrived at her house she would be combative and cursive and they would have to leave.  The third time she was given the option of coming to the hospital or being arrested by the police and she was brought to the ED.  Patient is soundly sleeping.  When I wake her up she does not know where she is.  She denies calling EMS today.  She does states she fell on the 17th.  She has pain in her right shoulder.  She also complains of pain in her tailbone.  EMS had to give her 5 mg of Versed IM because of her combativeness.  Patient told the nursing staff she had not eaten much today.  EMS also notes she had a Coke bottle full of vodka.  PCP Lucia Gaskins, MD   Past Medical History:  Diagnosis Date  . Acid reflux   . Alcohol abuse, in remission   . Arthritis   . Bipolar 1 disorder ([place])   . Chronic back pain    Dr Merlene Laughter  . Chronic constipation   . COPD (chronic obstructive pulmonary disease) ([place])   . Depression   . ETOH abuse   . Hypertension   . Neurotic depression   . Personality disorder ([place])   . S/P colonoscopy 12/28/05   Dr Catalina Antigua prep, lipoma left colon  . 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 Seizures ([place])   . Sinus drainage     Patient Active Problem List   Diagnosis Date Noted  . Gastroesophageal reflux disease without esophagitis 06/13/2016  . Demand ischemia ([place]) 05/07/2014  . Altered mental state 05/06/2014  . Tobacco abuse 05/06/2014  . ETOH abuse 05/06/2014  . Drug  overdose 05/06/2014  . Elevated troponin 05/06/2014  . CAP (community acquired pneumonia) 05/06/2014  . Lactic acidosis 05/06/2014  . Leukocytosis 05/06/2014  . Severe depression ([place]) 05/06/2014  . Insomnia 05/06/2014  . Hypothyroidism 05/06/2014  . GERD (gastroesophageal reflux disease) 05/06/2014  . Rhabdomyolysis 05/06/2014  . Physical deconditioning 05/06/2014  . Altered mental status   . Aspiration pneumonia ([place])   . Abnormal CT scan, pancreas or bile duct 11/25/2010  . Abdominal pain 11/25/2010  . ABNORMAL ELECTROCARDIOGRAM 06/16/2009  . B12 DEFICIENCY 06/15/2009  . NONDEPENDENT TOBACCO USE DISORDER 06/15/2009  . Essential hypertension 06/15/2009  . COPD (chronic obstructive pulmonary disease) ([place]) 06/15/2009  . SYNCOPE AND COLLAPSE 06/15/2009  . OTHER MALAISE AND FATIGUE 06/15/2009  . BACK PAIN 03/25/2007    Past Surgical History:  Procedure Laterality Date  . APPENDECTOMY    . complete hysterectomy    . ESOPHAGOGASTRODUODENOSCOPY N/A 06/16/2016   Procedure: ESOPHAGOGASTRODUODENOSCOPY (EGD);  Surgeon: Rogene Houston, MD;  Location: [facility] ENDO SUITE;  Service: Endoscopy;  Laterality: N/A;  1200     OB History   None      Home Medications    Prior to Admission medications   Medication Sig Start Date End Date Taking? Authorizing Provider  ALPRAZolam Duanne Moron)  0.5 MG tablet Take 1 tablet (0.5 mg total) by mouth at bedtime as needed for anxiety. 12/20/17   Nat Christen, MD  amLODipine (NORVASC) 5 MG tablet Take 5 mg by mouth daily.      [provider]  Ascorbic Acid (VITAMIN C) 1000 MG tablet Take 1,000 mg by mouth daily.      [provider]  Aspirin-Salicylamide-Caffeine [phone] MG PACK Take 1 Package by mouth as needed.    [provider]  B Complex-C (SUPER B COMPLEX PO) Take by mouth.    [provider]  ferrous gluconate (FERGON) 325 MG tablet Take 325 mg by mouth daily with breakfast.      [provider]  fish  oil-omega-3 fatty acids 1000 MG capsule Take 2 g by mouth daily.      [provider]  gabapentin (NEURONTIN) 300 MG capsule Take 300 mg by mouth Daily. 11/04/10   [provider]  ibuprofen (ADVIL,MOTRIN) 400 MG tablet TAKE TWO (2) TABLETS BY MOUTH EVERY 6 TO 8 HOURS AS NEEDED. 10/15/17   [provider]  levothyroxine (SYNTHROID, LEVOTHROID) 50 MCG tablet Take 50 mcg by mouth daily. 04/08/14   [provider]  magnesium 30 MG tablet Take 500 mg by mouth 2 (two) times daily.    [provider]  metoprolol tartrate (LOPRESSOR) 25 MG tablet Take 37.5 mg by mouth 2 (two) times daily.  11/16/10   [provider]  Multiple Vitamin (MULTIVITAMIN) capsule Take 1 capsule by mouth daily.      [provider]  oxyCODONE-acetaminophen (PERCOCET/ROXICET) 5-325 MG tablet Take 1 tablet by mouth every 4 (four) hours as needed for severe pain. 12/20/17   Nat Christen, MD  pantoprazole (PROTONIX) 40 MG tablet TAKE ONE TABLET BY MOUTH TWICE DAILY. TAKE BEFORE A MEAL. (MORNING ,EVENING) 01/24/18   Setzer, Rona Ravens, NP  PARoxetine (PAXIL) 20 MG tablet Take 1 tablet by mouth daily. 11/30/17   [provider]  potassium chloride SA (K-DUR,KLOR-CON) 20 MEQ tablet Take 1 tablet (20 mEq total) by mouth 2 (two) times daily. 12/20/17   Nat Christen, MD  pravastatin (PRAVACHOL) 40 MG tablet Take 1 tablet (40 mg total) by mouth at bedtime. 05/08/14   Lucia Gaskins, MD  PROAIR HFA 108 (90 BASE) MCG/ACT inhaler Inhale 2 puffs into the lungs 4 (four) times daily as needed for wheezing or shortness of breath.  09/15/14   [provider]  topiramate (TOPAMAX) 100 MG tablet Take 100 mg by mouth 2 (two) times daily.      [provider]  traZODone (DESYREL) 150 MG tablet Take 2 tablets by mouth at bedtime. 09/30/14   [provider]  vitamin A 10000 UNIT capsule Take 8,000 Units by mouth daily.    [provider]  vitamin E 200 UNIT  capsule Take 400 Units by mouth daily.    [provider]    Family History Family History  Problem Relation Age of Onset  . Heart attack Mother        Deceased  . Diabetes Mother        Deceased  . Cancer Father        Deceased  . Schizophrenia Brother     Social History Social History   Tobacco Use  . Smoking status: Heavy Tobacco Smoker    Packs/day: 0.50    Years: 48.00    Pack years: 24.00    Types: Cigarettes  . Smokeless tobacco: Never Used  .  Tobacco comment: smoke the fake cigarettes now  Substance Use Topics  . Alcohol use: Yes    Comment: heavy etoh x20yrs, quit 2004  . Drug use: No    Frequency: 5.0 times per week  per EMS patient has a caretaker during the week Lives at home   Allergies   Pregabalin   Review of Systems Review of Systems  Unable to perform ROS: Mental status change     Physical Exam Updated Vital Signs BP (!) 133/59 (BP Location: Left Arm)   Pulse 75   Temp 98.3 F (36.8 C) (Oral)   Resp 16   Ht 5\' 2"  (1.575 m)   Wt 56.7 kg   SpO2 100%   BMI 22.86 kg/m   Vital signs normal    Physical Exam  Constitutional: She is oriented to person, place, and time.  Non-toxic appearance. She does not appear ill. No distress.  Small elderly female who is sleeping soundly, I had to shake her leg to wake her up.  HENT:  Head: Normocephalic and atraumatic.  Right Ear: External ear normal.  Left Ear: External ear normal.  Nose: Nose normal. No mucosal edema or rhinorrhea.  Mouth/Throat: Mucous membranes are dry. No dental abscesses or uvula swelling.  Eyes: Pupils are equal, round, and reactive to light. Conjunctivae and EOM are normal.  Neck: Normal range of motion and full passive range of motion without pain. Neck supple.  Cardiovascular: Normal rate, regular rhythm and normal heart sounds. Exam reveals no gallop and no friction rub.  No murmur heard. Pulmonary/Chest: Effort normal and breath sounds normal. No respiratory  distress. She has no wheezes. She has no rhonchi. She has no rales. She exhibits no tenderness and no crepitus.  Abdominal: Soft. Normal appearance and bowel sounds are normal. She exhibits no distension. There is no tenderness. There is no rebound and no guarding.  Musculoskeletal: Normal range of motion. She exhibits no edema or tenderness.  Moves all extremities well.  Patient complains of right shoulder pain, she has yellow bruising over the shoulder however please note that she uses her right arm to grab the rail and pull herself over.  She also complains of pain in her sacral area.  There is no bruising or decubitus ulcer seen.  Neurological: She is alert and oriented to person, place, and time. She has normal strength. No cranial nerve deficit.  Skin: Skin is warm, dry and intact. No rash noted. No erythema. No pallor.  Patient is noted to have multiple small bruises on her body.  Psychiatric: She has a normal mood and affect. Her speech is normal and behavior is normal. Her mood appears not anxious.  Nursing note and vitals reviewed.    ED Treatments / Results  Labs (all labs ordered are listed, but only abnormal results are displayed) Results for orders placed or performed during the hospital encounter of 03/18/18  Comprehensive metabolic panel  Result Value Ref Range   Sodium 144 135 - 145 mmol/L   Potassium 3.1 (L) 3.5 - 5.1 mmol/L   Chloride 115 (H) 98 - 111 mmol/L   CO2 25 22 - 32 mmol/L   Glucose, Bld 107 (H) 70 - 99 mg/dL   BUN <5 (L) 8 - 23 mg/dL   Creatinine, Ser 0.86 0.44 - 1.00 mg/dL   Calcium 8.6 (L) 8.9 - 10.3 mg/dL   Total Protein 6.5 6.5 - 8.1 g/dL   Albumin 3.3 (L) 3.5 - 5.0 g/dL   AST 62 (H) 15 -  41 U/L   ALT 26 0 - 44 U/L   Alkaline Phosphatase 97 38 - 126 U/L   Total Bilirubin 0.6 0.3 - 1.2 mg/dL   GFR calc non Af Amer >60 >60 mL/min   GFR calc Af Amer >60 >60 mL/min   Anion gap 4 (L) 5 - 15  Ethanol  Result Value Ref Range   Alcohol, Ethyl (B) 145 (H)  <10 mg/dL  CBC with Differential  Result Value Ref Range   WBC 8.0 4.0 - 10.5 K/uL   RBC 4.02 3.87 - 5.11 MIL/uL   Hemoglobin 13.1 12.0 - 15.0 g/dL   HCT 41.2 36.0 - 46.0 %   MCV 102.5 (H) 80.0 - 100.0 fL   MCH 32.6 26.0 - 34.0 pg   MCHC 31.8 30.0 - 36.0 g/dL   RDW 13.4 11.5 - 15.5 %   Platelets 132 (L) 150 - 400 K/uL   nRBC 0.0 0.0 - 0.2 %   Neutrophils Relative % 51 %   Neutro Abs 4.1 1.7 - 7.7 K/uL   Lymphocytes Relative 40 %   Lymphs Abs 3.2 0.7 - 4.0 K/uL   Monocytes Relative 8 %   Monocytes Absolute 0.6 0.1 - 1.0 K/uL   Eosinophils Relative 1 %   Eosinophils Absolute 0.0 0.0 - 0.5 K/uL   Basophils Relative 0 %   Basophils Absolute 0.0 0.0 - 0.1 K/uL   Immature Granulocytes 0 %   Abs Immature Granulocytes 0.01 0.00 - 0.07 K/uL   Laboratory interpretation all normal except hypokalemia, mild elevation of LFTs, alcohol intoxication, elevation of the MCV consistent with folic acid or vitamin P29 deficiency from alcohol    EKG None  Radiology Dg Pelvis 1-2 Views  Result Date: 03/18/2018 CLINICAL DATA:  Multiple falls at home. EXAM: PELVIS - 1-2 VIEW COMPARISON:  08/24/2016 FINDINGS: The cortical margins of the bony pelvis are intact. No fracture. Pubic symphysis and sacroiliac joints are congruent. Both femoral heads are well-seated in the respective acetabula. The bones are under mineralized. IMPRESSION: Negative for pelvic fracture. Electronically Signed   By: Keith Rake M.D.   On: 03/18/2018 03:19   Dg Sacrum/coccyx  Result Date: 03/18/2018 CLINICAL DATA:  Multiple falls at home. EXAM: SACRUM AND COCCYX - 2+ VIEW COMPARISON:  Pelvis from right hip radiographs 08/24/2016 FINDINGS: There is no evidence of fracture or other focal bone lesions. Sacral ala are maintained. Mild sacroiliac joint degenerative change. The bones are under mineralized. IMPRESSION: No evidence of sacrococcygeal fracture. Electronically Signed   By: Keith Rake M.D.   On: 03/18/2018 03:18     Dg Shoulder Right  Result Date: 03/18/2018 CLINICAL DATA:  Fall at home.  Old bruising. EXAM: RIGHT SHOULDER - 2+ VIEW COMPARISON:  Shoulder radiograph 08/25/2017 performed at [facility]. FINDINGS: There is no evidence of fracture or dislocation. There is no evidence of arthropathy or other focal bone abnormality. Bones are under mineralized. Soft tissues are unremarkable. IMPRESSION: No fracture or dislocation of the right shoulder. Electronically Signed   By: Keith Rake M.D.   On: 03/18/2018 03:16   Ct Head Wo Contrast  Result Date: 03/18/2018 CLINICAL DATA:  Multiple recent falls EXAM: CT HEAD WITHOUT CONTRAST TECHNIQUE: Contiguous axial images were obtained from the base of the skull through the vertex without intravenous contrast. COMPARISON:  Head CT 12/20/2017 FINDINGS: Brain: There is no mass, hemorrhage or extra-axial collection. There is generalized atrophy without lobar predilection. There is no acute or chronic infarction. There is hypoattenuation of  the periventricular white matter, most commonly indicating chronic ischemic microangiopathy. Vascular: No abnormal hyperdensity of the major intracranial arteries or dural venous sinuses. No intracranial atherosclerosis. Skull: The visualized skull base, calvarium and extracranial soft tissues are normal. Sinuses/Orbits: No fluid levels or advanced mucosal thickening of the visualized paranasal sinuses. No mastoid or middle ear effusion. The orbits are normal. IMPRESSION: 1. No acute intracranial abnormality. 2. Chronic small vessel ischemia and generalized volume loss. Electronically Signed   By: Ulyses Jarred M.D.   On: 03/18/2018 06:29    Procedures Procedures (including critical care time)  Medications Ordered in ED Medications  potassium chloride SA (K-DUR,KLOR-CON) CR tablet 40 mEq (40 mEq Oral Given 03/18/18 0424)     Initial Impression / Assessment and Plan / ED Course  I have reviewed the triage vital signs and the  nursing notes.  Pertinent labs & imaging results that were available during my care of the patient were reviewed by me and considered in my medical decision making (see chart for details).   Patient remains intermittently verbally abusive to staff.  At one point in her ED visit she complained of a headache and said she is been having headaches.  Therefore head CT was added to make sure she did not have a subdural hematoma or something else from her alcoholism and falling.  At the end of her visit patient did not have any acute injuries, she was intoxicated.  She was kept in the ED longer because she did not have a ride home.  She is supposed to have a caregiver take care of her today so hopefully they can come pick her up at the ED and take her back to her home.    Review of the [place] shows patient gets #120 oxycodone 5/325 monthly from her PCP, last filled August 29, and she was on #120 alprazolam 0.5 mg tablets, last filled April 8, and that was changed to 0.25 mg tablets on May 7 and June 6 and July 26, and then she got #60 Valium 5 mg tablets on September 26.  These are all prescribed by her PCP.   Final Clinical Impressions(s) / ED Diagnoses   Final diagnoses:  Alcoholic intoxication without complication ([place])  Fall in home, initial encounter  Contusion of multiple sites of right shoulder and upper arm, initial encounter    ED Discharge Orders    None      Plan discharge  Rolland Porter, MD, Barbette Or, MD 03/18/18 [phone]

## 2018-03-18 NOTE — Discharge Instructions (Addendum)
Use ice to the bruised areas.  You can take Tylenol as needed for pain.  Recheck if you get worse.

## 2018-03-18 NOTE — ED Triage Notes (Addendum)
Per EMS called out 3 times for falls, patient cursing and combative, ETOH on board giving Versed 2.5 x 2 with no effects.  CBG 91, only thing patient has eaten today is bowl of vegetable soup.  Has caretaker come in several times a week, but not on weekends.

## 2018-06-19 ENCOUNTER — Other Ambulatory Visit: Payer: Self-pay

## 2018-06-19 ENCOUNTER — Emergency Department (HOSPITAL_COMMUNITY): Payer: Medicare Other

## 2018-06-19 ENCOUNTER — Encounter (HOSPITAL_COMMUNITY): Payer: Self-pay | Admitting: Emergency Medicine

## 2018-06-19 ENCOUNTER — Observation Stay (HOSPITAL_COMMUNITY): Payer: Medicare Other

## 2018-06-19 ENCOUNTER — Inpatient Hospital Stay (HOSPITAL_COMMUNITY)
Admission: EM | Admit: 2018-06-19 | Discharge: 2018-06-27 | DRG: 917 | Disposition: A | Discharge status: Skilled Nursing Facility | Payer: Medicare Other | Attending: Internal Medicine | Admitting: Internal Medicine

## 2018-06-19 DIAGNOSIS — F1011 Alcohol abuse, in remission: Secondary | ICD-10-CM | POA: Diagnosis present

## 2018-06-19 DIAGNOSIS — E876 Hypokalemia: Secondary | ICD-10-CM

## 2018-06-19 DIAGNOSIS — K219 Gastro-esophageal reflux disease without esophagitis: Secondary | ICD-10-CM | POA: Diagnosis present

## 2018-06-19 DIAGNOSIS — G8929 Other chronic pain: Secondary | ICD-10-CM | POA: Diagnosis present

## 2018-06-19 DIAGNOSIS — W19XXXA Unspecified fall, initial encounter: Secondary | ICD-10-CM

## 2018-06-19 DIAGNOSIS — T391X1A Poisoning by 4-Aminophenol derivatives, accidental (unintentional), initial encounter: Secondary | ICD-10-CM

## 2018-06-19 DIAGNOSIS — F17210 Nicotine dependence, cigarettes, uncomplicated: Secondary | ICD-10-CM | POA: Diagnosis present

## 2018-06-19 DIAGNOSIS — W010XXA Fall on same level from slipping, tripping and stumbling without subsequent striking against object, initial encounter: Secondary | ICD-10-CM | POA: Diagnosis present

## 2018-06-19 DIAGNOSIS — E860 Dehydration: Secondary | ICD-10-CM | POA: Diagnosis present

## 2018-06-19 DIAGNOSIS — I10 Essential (primary) hypertension: Secondary | ICD-10-CM | POA: Diagnosis present

## 2018-06-19 DIAGNOSIS — J189 Pneumonia, unspecified organism: Secondary | ICD-10-CM | POA: Diagnosis present

## 2018-06-19 DIAGNOSIS — F609 Personality disorder, unspecified: Secondary | ICD-10-CM | POA: Diagnosis present

## 2018-06-19 DIAGNOSIS — T402X1A Poisoning by other opioids, accidental (unintentional), initial encounter: Secondary | ICD-10-CM | POA: Diagnosis not present

## 2018-06-19 DIAGNOSIS — E039 Hypothyroidism, unspecified: Secondary | ICD-10-CM | POA: Diagnosis present

## 2018-06-19 DIAGNOSIS — M549 Dorsalgia, unspecified: Secondary | ICD-10-CM

## 2018-06-19 DIAGNOSIS — F341 Dysthymic disorder: Secondary | ICD-10-CM | POA: Diagnosis present

## 2018-06-19 DIAGNOSIS — G9340 Encephalopathy, unspecified: Secondary | ICD-10-CM | POA: Diagnosis not present

## 2018-06-19 DIAGNOSIS — M545 Low back pain: Secondary | ICD-10-CM | POA: Diagnosis present

## 2018-06-19 DIAGNOSIS — D649 Anemia, unspecified: Secondary | ICD-10-CM | POA: Diagnosis present

## 2018-06-19 DIAGNOSIS — T391X5A Adverse effect of 4-Aminophenol derivatives, initial encounter: Secondary | ICD-10-CM | POA: Diagnosis not present

## 2018-06-19 DIAGNOSIS — E440 Moderate protein-calorie malnutrition: Secondary | ICD-10-CM

## 2018-06-19 DIAGNOSIS — R000 Tachycardia, unspecified: Secondary | ICD-10-CM | POA: Diagnosis present

## 2018-06-19 DIAGNOSIS — Y92009 Unspecified place in unspecified non-institutional (private) residence as the place of occurrence of the external cause: Secondary | ICD-10-CM

## 2018-06-19 DIAGNOSIS — R400 Somnolence: Secondary | ICD-10-CM

## 2018-06-19 DIAGNOSIS — Z8249 Family history of ischemic heart disease and other diseases of the circulatory system: Secondary | ICD-10-CM

## 2018-06-19 DIAGNOSIS — G92 Toxic encephalopathy: Secondary | ICD-10-CM | POA: Diagnosis present

## 2018-06-19 DIAGNOSIS — Z818 Family history of other mental and behavioral disorders: Secondary | ICD-10-CM

## 2018-06-19 DIAGNOSIS — M25551 Pain in right hip: Secondary | ICD-10-CM | POA: Diagnosis present

## 2018-06-19 DIAGNOSIS — Z6822 Body mass index (BMI) 22.0-22.9, adult: Secondary | ICD-10-CM

## 2018-06-19 DIAGNOSIS — Z79890 Hormone replacement therapy (postmenopausal): Secondary | ICD-10-CM

## 2018-06-19 DIAGNOSIS — K5909 Other constipation: Secondary | ICD-10-CM | POA: Diagnosis present

## 2018-06-19 DIAGNOSIS — Z888 Allergy status to other drugs, medicaments and biological substances status: Secondary | ICD-10-CM

## 2018-06-19 DIAGNOSIS — R079 Chest pain, unspecified: Secondary | ICD-10-CM

## 2018-06-19 DIAGNOSIS — J159 Unspecified bacterial pneumonia: Secondary | ICD-10-CM | POA: Diagnosis present

## 2018-06-19 DIAGNOSIS — Z79899 Other long term (current) drug therapy: Secondary | ICD-10-CM

## 2018-06-19 DIAGNOSIS — T40601A Poisoning by unspecified narcotics, accidental (unintentional), initial encounter: Secondary | ICD-10-CM

## 2018-06-19 DIAGNOSIS — Z9114 Patient's other noncompliance with medication regimen: Secondary | ICD-10-CM

## 2018-06-19 DIAGNOSIS — J449 Chronic obstructive pulmonary disease, unspecified: Secondary | ICD-10-CM | POA: Diagnosis present

## 2018-06-19 DIAGNOSIS — F319 Bipolar disorder, unspecified: Secondary | ICD-10-CM | POA: Diagnosis present

## 2018-06-19 DIAGNOSIS — Z90710 Acquired absence of both cervix and uterus: Secondary | ICD-10-CM

## 2018-06-19 LAB — CBC WITH DIFFERENTIAL/PLATELET
ABS IMMATURE GRANULOCYTES: 0.05 10*3/uL (ref 0.00–0.07)
BASOS ABS: 0 10*3/uL (ref 0.0–0.1)
Basophils Relative: 0 %
Eosinophils Absolute: 0 10*3/uL (ref 0.0–0.5)
Eosinophils Relative: 0 %
HCT: 35.6 % — ABNORMAL LOW (ref 36.0–46.0)
Hemoglobin: 11 g/dL — ABNORMAL LOW (ref 12.0–15.0)
IMMATURE GRANULOCYTES: 0 %
Lymphocytes Relative: 11 %
Lymphs Abs: 1.3 10*3/uL (ref 0.7–4.0)
MCH: 31.7 pg (ref 26.0–34.0)
MCHC: 30.9 g/dL (ref 30.0–36.0)
MCV: 102.6 fL — ABNORMAL HIGH (ref 80.0–100.0)
MONOS PCT: 7 %
Monocytes Absolute: 0.8 10*3/uL (ref 0.1–1.0)
NEUTROS ABS: 9.7 10*3/uL — AB (ref 1.7–7.7)
NEUTROS PCT: 82 %
NRBC: 0 % (ref 0.0–0.2)
Platelets: 118 10*3/uL — ABNORMAL LOW (ref 150–400)
RBC: 3.47 MIL/uL — ABNORMAL LOW (ref 3.87–5.11)
RDW: 12.4 % (ref 11.5–15.5)
WBC: 12 10*3/uL — ABNORMAL HIGH (ref 4.0–10.5)

## 2018-06-19 LAB — COMPREHENSIVE METABOLIC PANEL
ALBUMIN: 2.8 g/dL — AB (ref 3.5–5.0)
ALT: 17 U/L (ref 0–44)
ALT: 17 U/L (ref 0–44)
ANION GAP: 7 (ref 5–15)
AST: 41 U/L (ref 15–41)
AST: 43 U/L — ABNORMAL HIGH (ref 15–41)
Albumin: 3.3 g/dL — ABNORMAL LOW (ref 3.5–5.0)
Alkaline Phosphatase: 94 U/L (ref 38–126)
Alkaline Phosphatase: 109 U/L (ref 38–126)
Anion gap: 10 (ref 5–15)
BILIRUBIN TOTAL: 0.4 mg/dL (ref 0.3–1.2)
BUN: 9 mg/dL (ref 8–23)
BUN: 9 mg/dL (ref 8–23)
CHLORIDE: 105 mmol/L (ref 98–111)
CHLORIDE: 104 mmol/L (ref 98–111)
CO2: 27 mmol/L (ref 22–32)
CO2: 25 mmol/L (ref 22–32)
Calcium: 12.1 mg/dL — ABNORMAL HIGH (ref 8.9–10.3)
Calcium: 12.3 mg/dL — ABNORMAL HIGH (ref 8.9–10.3)
Creatinine, Ser: 0.83 mg/dL (ref 0.44–1.00)
Creatinine, Ser: 0.76 mg/dL (ref 0.44–1.00)
GFR calc Af Amer: >60 mL/min (ref >60)
GFR calc Af Amer: >60 mL/min (ref >60)
GFR calc non Af Amer: >60 mL/min (ref >60)
GLUCOSE: 110 mg/dL — AB (ref 70–99)
Glucose, Bld: 101 mg/dL — ABNORMAL HIGH (ref 70–99)
POTASSIUM: 2.9 mmol/L — AB (ref 3.5–5.1)
Potassium: 2.8 mmol/L — ABNORMAL LOW (ref 3.5–5.1)
Sodium: 139 mmol/L (ref 135–145)
Sodium: 139 mmol/L (ref 135–145)
Total Bilirubin: 0.8 mg/dL (ref 0.3–1.2)
Total Protein: 6 g/dL — ABNORMAL LOW (ref 6.5–8.1)
Total Protein: 7.1 g/dL (ref 6.5–8.1)

## 2018-06-19 LAB — PROTIME-INR
INR: 1.06
Prothrombin Time: 13.7 seconds (ref 11.4–15.2)

## 2018-06-19 LAB — ACETAMINOPHEN LEVEL: Acetaminophen (Tylenol), Serum: 42 ug/mL — ABNORMAL HIGH (ref 10–30)

## 2018-06-19 LAB — AMMONIA: AMMONIA: 23 umol/L (ref 9–35)

## 2018-06-19 LAB — TROPONIN I: Troponin I: <0.03 ng/mL (ref <0.03)

## 2018-06-19 LAB — SALICYLATE LEVEL: Salicylate Lvl: <7 mg/dL (ref 2.8–30.0)

## 2018-06-19 LAB — MAGNESIUM
MAGNESIUM: 1.3 mg/dL — AB (ref 1.7–2.4)
Magnesium: 1.4 mg/dL — ABNORMAL LOW (ref 1.7–2.4)

## 2018-06-19 LAB — ETHANOL

## 2018-06-19 MED ORDER — VITAMIN B-1 100 MG PO TABS
100.0000 mg | ORAL_TABLET | Freq: Every day | ORAL | Status: DC
  Administered 2018-06-20 – 2018-06-27 (×7): 100 mg via ORAL
  Filled 2018-06-19 (×8): qty 1

## 2018-06-19 MED ORDER — SODIUM CHLORIDE 0.9 % IV SOLN: INTRAVENOUS | Status: DC

## 2018-06-19 MED ORDER — SODIUM CHLORIDE 0.9 % IV SOLN: INTRAVENOUS | Status: AC

## 2018-06-19 MED ORDER — AZITHROMYCIN 250 MG PO TABS
500.0000 mg | ORAL_TABLET | ORAL | Status: DC
  Administered 2018-06-19 – 2018-06-20 (×2): 500 mg via ORAL
  Filled 2018-06-19 (×3): qty 2

## 2018-06-19 MED ORDER — ENOXAPARIN SODIUM 40 MG/0.4ML ~~LOC~~ SOLN
40.0000 mg | SUBCUTANEOUS | Status: DC
  Administered 2018-06-20 – 2018-06-26 (×7): 40 mg via SUBCUTANEOUS
  Filled 2018-06-19 (×7): qty 0.4

## 2018-06-19 MED ORDER — MULTIVITAMINS PO CAPS: 1.0000 | ORAL_CAPSULE | Freq: Every day | ORAL | Status: DC

## 2018-06-19 MED ORDER — PANTOPRAZOLE SODIUM 40 MG PO TBEC
40.0000 mg | DELAYED_RELEASE_TABLET | Freq: Two times a day (BID) | ORAL | Status: DC
  Administered 2018-06-20 – 2018-06-27 (×15): 40 mg via ORAL
  Filled 2018-06-19 (×15): qty 1

## 2018-06-19 MED ORDER — POTASSIUM CHLORIDE 10 MEQ/100ML IV SOLN
10.0000 meq | INTRAVENOUS | Status: AC
  Administered 2018-06-19: 10 meq via INTRAVENOUS
  Filled 2018-06-19: qty 100

## 2018-06-19 MED ORDER — VITAMIN A 10000 UNITS PO CAPS
10000.0000 [IU] | ORAL_CAPSULE | Freq: Every day | ORAL | Status: DC
  Administered 2018-06-21 – 2018-06-25 (×5): 10000 [IU] via ORAL
  Filled 2018-06-19 (×8): qty 1

## 2018-06-19 MED ORDER — THIAMINE HCL 100 MG/ML IJ SOLN
100.0000 mg | Freq: Every day | INTRAMUSCULAR | Status: DC
  Administered 2018-06-22 – 2018-06-24 (×2): 100 mg via INTRAVENOUS
  Filled 2018-06-19 (×3): qty 2

## 2018-06-19 MED ORDER — VITAMIN C 500 MG PO TABS
1000.0000 mg | ORAL_TABLET | Freq: Every day | ORAL | Status: DC
  Administered 2018-06-20 – 2018-06-27 (×8): 1000 mg via ORAL
  Filled 2018-06-19 (×8): qty 2

## 2018-06-19 MED ORDER — POTASSIUM CHLORIDE CRYS ER 20 MEQ PO TBCR
40.0000 meq | EXTENDED_RELEASE_TABLET | Freq: Once | ORAL | Status: AC
  Administered 2018-06-19: 40 meq via ORAL
  Filled 2018-06-19: qty 2

## 2018-06-19 MED ORDER — MAGNESIUM SULFATE 2 GM/50ML IV SOLN
2.0000 g | Freq: Once | INTRAVENOUS | Status: AC
  Administered 2018-06-19: 2 g via INTRAVENOUS
  Filled 2018-06-19: qty 50

## 2018-06-19 MED ORDER — SODIUM CHLORIDE 0.9 % IV BOLUS
500.0000 mL | Freq: Once | INTRAVENOUS | Status: AC
  Administered 2018-06-19: 500 mL via INTRAVENOUS

## 2018-06-19 MED ORDER — ACETYLCYSTEINE LOAD VIA INFUSION
150.0000 mg/kg | Freq: Once | INTRAVENOUS | Status: AC
  Administered 2018-06-19: 8400 mg via INTRAVENOUS
  Filled 2018-06-19: qty 210

## 2018-06-19 MED ORDER — VITAMIN E 180 MG (400 UNIT) PO CAPS
400.0000 [IU] | ORAL_CAPSULE | Freq: Every day | ORAL | Status: DC
  Administered 2018-06-20 – 2018-06-27 (×8): 400 [IU] via ORAL
  Filled 2018-06-19 (×8): qty 1

## 2018-06-19 MED ORDER — FOLIC ACID 1 MG PO TABS
1.0000 mg | ORAL_TABLET | Freq: Every day | ORAL | Status: DC
  Administered 2018-06-20 – 2018-06-27 (×8): 1 mg via ORAL
  Filled 2018-06-19 (×8): qty 1

## 2018-06-19 MED ORDER — NALOXONE HCL 0.4 MG/ML IJ SOLN
0.4000 mg | Freq: Once | INTRAMUSCULAR | Status: AC
  Administered 2018-06-19: 0.4 mg via INTRAVENOUS
  Filled 2018-06-19: qty 1

## 2018-06-19 MED ORDER — SODIUM CHLORIDE 0.9 % IV SOLN
1.0000 g | INTRAVENOUS | Status: DC
  Administered 2018-06-19 – 2018-06-20 (×2): 1 g via INTRAVENOUS
  Filled 2018-06-19: qty 10
  Filled 2018-06-19: qty 1

## 2018-06-19 MED ORDER — IPRATROPIUM-ALBUTEROL 0.5-2.5 (3) MG/3ML IN SOLN: 3.0000 mL | Freq: Four times a day (QID) | RESPIRATORY_TRACT | Status: DC | PRN

## 2018-06-19 MED ORDER — OMEGA-3-ACID ETHYL ESTERS 1 G PO CAPS
2.0000 g | ORAL_CAPSULE | Freq: Every day | ORAL | Status: DC
  Administered 2018-06-20 – 2018-06-27 (×7): 2 g via ORAL
  Filled 2018-06-19 (×7): qty 2

## 2018-06-19 MED ORDER — ADULT MULTIVITAMIN W/MINERALS CH
1.0000 | ORAL_TABLET | Freq: Every day | ORAL | Status: DC
  Administered 2018-06-20 – 2018-06-27 (×8): 1 via ORAL
  Filled 2018-06-19 (×9): qty 1

## 2018-06-19 MED ORDER — DEXTROSE 5 % IV SOLN
15.0000 mg/kg/h | INTRAVENOUS | Status: DC
  Administered 2018-06-19: 15 mg/kg/h via INTRAVENOUS
  Filled 2018-06-19 (×2): qty 150

## 2018-06-19 MED ORDER — FERROUS GLUCONATE 324 (38 FE) MG PO TABS
325.0000 mg | ORAL_TABLET | Freq: Every day | ORAL | Status: DC
  Filled 2018-06-19 (×2): qty 1

## 2018-06-19 MED ORDER — LEVOTHYROXINE SODIUM 50 MCG PO TABS
50.0000 ug | ORAL_TABLET | Freq: Every day | ORAL | Status: DC
  Administered 2018-06-20 – 2018-06-27 (×8): 50 ug via ORAL
  Filled 2018-06-19 (×2): qty 1
  Filled 2018-06-19: qty 2
  Filled 2018-06-19 (×5): qty 1

## 2018-06-19 NOTE — ED Notes (Signed)
Pt is more alert and yelling out at staff at this time.  edp notified

## 2018-06-19 NOTE — ED Provider Notes (Signed)
[facility] EMERGENCY DEPARTMENT Provider Note   CSN: [id number] Arrival date & time: 06/19/18  1300     History   Chief Complaint Chief Complaint  Patient presents with  . Fall    HPI Ashley Hall is a 73 y.o. female.  The history is provided by the patient, the EMS personnel and a caregiver. The history is limited by the condition of the patient (AMS).  Fall   Pt was seen at 1325. Per EMS, and pt's caregiver's: Pt apparently fell yesterday, AMS today. Pt c/o right hand and hip "pains." EMS noted pt's norco tabs were "less than they should have been" on scene. Apparently rx norco filled 06/14/18, #120, and only around 90 pills were present. Pt currently laying eyes closed, opens to name, then falls back asleep.    Past Medical History:  Diagnosis Date  . Acid reflux   . Alcohol abuse, in remission   . Arthritis   . Bipolar 1 disorder ([place])   . Chronic back pain    Dr Merlene Laughter  . Chronic constipation   . COPD (chronic obstructive pulmonary disease) ([place])   . Depression   . ETOH abuse   . Hypertension   . Neurotic depression   . Personality disorder ([place])   . S/P colonoscopy 12/28/05   Dr Catalina Antigua prep, lipoma left colon  . 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 Seizures ([place])   . Sinus drainage     Patient Active Problem List   Diagnosis Date Noted  . Gastroesophageal reflux disease without esophagitis 06/13/2016  . Demand ischemia ([place]) 05/07/2014  . Altered mental state 05/06/2014  . Tobacco abuse 05/06/2014  . ETOH abuse 05/06/2014  . Drug overdose 05/06/2014  . Elevated troponin 05/06/2014  . CAP (community acquired pneumonia) 05/06/2014  . Lactic acidosis 05/06/2014  . Leukocytosis 05/06/2014  . Severe depression ([place]) 05/06/2014  . Insomnia 05/06/2014  . Hypothyroidism 05/06/2014  . GERD (gastroesophageal reflux disease) 05/06/2014  . Rhabdomyolysis 05/06/2014  . Physical deconditioning 05/06/2014  . Altered mental status   .  Aspiration pneumonia ([place])   . Abnormal CT scan, pancreas or bile duct 11/25/2010  . Abdominal pain 11/25/2010  . ABNORMAL ELECTROCARDIOGRAM 06/16/2009  . B12 DEFICIENCY 06/15/2009  . NONDEPENDENT TOBACCO USE DISORDER 06/15/2009  . Essential hypertension 06/15/2009  . COPD (chronic obstructive pulmonary disease) ([place]) 06/15/2009  . SYNCOPE AND COLLAPSE 06/15/2009  . OTHER MALAISE AND FATIGUE 06/15/2009  . BACK PAIN 03/25/2007    Past Surgical History:  Procedure Laterality Date  . APPENDECTOMY    . complete hysterectomy    . ESOPHAGOGASTRODUODENOSCOPY N/A 06/16/2016   Procedure: ESOPHAGOGASTRODUODENOSCOPY (EGD);  Surgeon: Rogene Houston, MD;  Location: [facility] ENDO SUITE;  Service: Endoscopy;  Laterality: N/A;  1200     OB History   No obstetric history on file.      Home Medications    Prior to Admission medications   Medication Sig Start Date End Date Taking? Authorizing Provider  ALPRAZolam Duanne Moron) 0.5 MG tablet Take 1 tablet (0.5 mg total) by mouth at bedtime as needed for anxiety. 12/20/17   Nat Christen, MD  amLODipine (NORVASC) 5 MG tablet Take 5 mg by mouth daily.      [provider]  Ascorbic Acid (VITAMIN C) 1000 MG tablet Take 1,000 mg by mouth daily.      [provider]  Aspirin-Salicylamide-Caffeine 465-035-46.5 MG PACK Take 1 Package by mouth as needed.    [provider]  B Complex-C (SUPER B COMPLEX PO) Take by mouth.    [provider]  ferrous gluconate (FERGON) 325 MG tablet Take 325 mg by mouth daily with breakfast.      [provider]  fish oil-omega-3 fatty acids 1000 MG capsule Take 2 g by mouth daily.      [provider]  gabapentin (NEURONTIN) 300 MG capsule Take 300 mg by mouth Daily. 11/04/10   [provider]  ibuprofen (ADVIL,MOTRIN) 400 MG tablet TAKE TWO (2) TABLETS BY MOUTH EVERY 6 TO 8 HOURS AS NEEDED. 10/15/17   [provider]  levothyroxine (SYNTHROID, LEVOTHROID) 50 MCG  tablet Take 50 mcg by mouth daily. 04/08/14   [provider]  magnesium 30 MG tablet Take 500 mg by mouth 2 (two) times daily.    [provider]  metoprolol tartrate (LOPRESSOR) 25 MG tablet Take 37.5 mg by mouth 2 (two) times daily.  11/16/10   [provider]  Multiple Vitamin (MULTIVITAMIN) capsule Take 1 capsule by mouth daily.      [provider]  oxyCODONE-acetaminophen (PERCOCET/ROXICET) 5-325 MG tablet Take 1 tablet by mouth every 4 (four) hours as needed for severe pain. 12/20/17   Nat Christen, MD  pantoprazole (PROTONIX) 40 MG tablet TAKE ONE TABLET BY MOUTH TWICE DAILY. TAKE BEFORE A MEAL. (MORNING ,EVENING) 01/24/18   Setzer, Rona Ravens, NP  PARoxetine (PAXIL) 20 MG tablet Take 1 tablet by mouth daily. 11/30/17   [provider]  potassium chloride SA (K-DUR,KLOR-CON) 20 MEQ tablet Take 1 tablet (20 mEq total) by mouth 2 (two) times daily. 12/20/17   Nat Christen, MD  pravastatin (PRAVACHOL) 40 MG tablet Take 1 tablet (40 mg total) by mouth at bedtime. 05/08/14   Lucia Gaskins, MD  PROAIR HFA 108 (90 BASE) MCG/ACT inhaler Inhale 2 puffs into the lungs 4 (four) times daily as needed for wheezing or shortness of breath.  09/15/14   [provider]  topiramate (TOPAMAX) 100 MG tablet Take 100 mg by mouth 2 (two) times daily.      [provider]  traZODone (DESYREL) 150 MG tablet Take 2 tablets by mouth at bedtime. 09/30/14   [provider]  vitamin A 10000 UNIT capsule Take 8,000 Units by mouth daily.    [provider]  vitamin E 200 UNIT capsule Take 400 Units by mouth daily.    [provider]    Family History Family History  Problem Relation Age of Onset  . Heart attack Mother        Deceased  . Diabetes Mother        Deceased  . Cancer Father        Deceased  . Schizophrenia Brother     Social History Social History   Tobacco Use  . Smoking status: Heavy Tobacco Smoker    Packs/day:  0.50    Years: 48.00    Pack years: 24.00    Types: Cigarettes  . Smokeless tobacco: Never Used  . Tobacco comment: smoke the fake cigarettes now  Substance Use Topics  . Alcohol use: Yes    Comment: heavy etoh x35yrs, quit 2004  . Drug use: No    Frequency: 5.0 times per week     Allergies   Pregabalin   Review of Systems Review of Systems  Unable to perform ROS: Mental status change     Physical Exam Updated Vital Signs BP (!) 97/51 (BP Location: Left Arm)   Pulse (!) 56  Temp 98.8 F (37.1 C) (Oral)   Resp 18   Ht 5\' 2"  (1.575 m)   Wt 56 kg   SpO2 96% Comment: 88% on RA  BMI 22.58 kg/m    BP 126/62   Pulse 79   Temp 98.8 F (37.1 C) (Oral)   Resp 19   Ht 5\' 2"  (1.575 m)   Wt 56 kg   SpO2 100%   BMI 22.58 kg/m    Physical Exam 1330: Physical examination:  Nursing notes reviewed; Vital signs and O2 SAT reviewed;  Constitutional: Thin, frail.  In no acute distress; Head:  Normocephalic, atraumatic; Eyes: EOMI, PERRL, No scleral icterus; ENMT: Mouth and pharynx normal, Mucous membranes dry; Neck: Supple, Full range of motion; Cardiovascular: Regular rate and rhythm, No gallop; Respiratory: Breath sounds clear & equal bilaterally, No wheezes.  Normal respiratory effort/excursion; Chest: Nontender, Movement normal. Healing scab left chest wall, no ecchymosis. No new abrasions. No soft tissue crepitus, no deformity. ; Abdomen: Soft, Nontender, Nondistended, Normal bowel sounds. No abrasions or ecchymosis. ; Genitourinary: No CVA tenderness; Spine:  No midline CS, TS, LS tenderness.;; Extremities: Peripheral pulses normal, Pelvis stable. +right hip tenderness to palp, no obvious deformity. NT right knee/ankle/foot. +fading ecchymosis with small superficial skin tear right dorsal hand, no edema, no drainage, no deformity. NT right hand/wrist/elbow/shoulder. Otherwise full ROM bilat UE's and LE's without pain/tenderness. No edema, No calf edema or asymmetry.; Neuro:  Somnolent. Opens eyes to name. Speech slurred. Pt moves all extremities spontaneously on stretcher. No facial droop. Falls back asleep easily..; Skin: Color normal, Warm, Dry.   ED Treatments / Results  Labs (all labs ordered are listed, but only abnormal results are displayed)   EKG EKG Interpretation  Date/Time:  Wednesday June 19 2018 13:26:41 EST Ventricular Rate:  54 PR Interval:  130 QRS Duration: 96 QT Interval:  406 QTC Calculation: 385 R Axis:   -43 Text Interpretation:  Sinus bradycardia Left axis deviation Septal infarct , age undetermined T wave abnormality, consider anterior ischemia When compared with ECG of 12/20/2017 T wave abnormality Anterior leads is unchanged T wave abnormality Inferior leads is now Present Confirmed by Francine Graven [phone]) on 06/19/2018 1:39:41 PM   Radiology   Procedures Procedures (including critical care time)  Medications Ordered in ED Medications  naloxone (NARCAN) injection 0.4 mg (has no administration in time range)     Initial Impression / Assessment and Plan / ED Course  I have reviewed the triage vital signs and the nursing notes.  Pertinent labs & imaging results that were available during my care of the patient were reviewed by me and considered in my medical decision making (see chart for details).  MDM Reviewed: previous chart, nursing note and vitals Reviewed previous: labs and ECG Interpretation: labs, ECG, x-ray and CT scan Total time providing critical care: 30-74 minutes. This excludes time spent performing separately reportable procedures and services. Consults: admitting MD   CRITICAL CARE Performed by: Francine Graven Total critical care time: 35 minutes Critical care time was exclusive of separately billable procedures and treating other patients. Critical care was necessary to treat or prevent imminent or life-threatening deterioration. Critical care was time spent personally by me on the  following activities: development of treatment plan with patient and/or surrogate as well as nursing, discussions with consultants, evaluation of patient's response to treatment, examination of patient, obtaining history from patient or surrogate, ordering and performing treatments and interventions, ordering and review of laboratory studies, ordering and review of  radiographic studies, pulse oximetry and re-evaluation of patient's condition.  Results for orders placed or performed during the hospital encounter of 06/19/18  Comprehensive metabolic panel  Result Value Ref Range   Sodium 139 135 - 145 mmol/L   Potassium 2.9 (L) 3.5 - 5.1 mmol/L   Chloride 105 98 - 111 mmol/L   CO2 27 22 - 32 mmol/L   Glucose, Bld 110 (H) 70 - 99 mg/dL   BUN 9 8 - 23 mg/dL   Creatinine, Ser 0.83 0.44 - 1.00 mg/dL   Calcium 12.1 (H) 8.9 - 10.3 mg/dL   Total Protein 6.0 (L) 6.5 - 8.1 g/dL   Albumin 2.8 (L) 3.5 - 5.0 g/dL   AST 41 15 - 41 U/L   ALT 17 0 - 44 U/L   Alkaline Phosphatase 94 38 - 126 U/L   Total Bilirubin 0.4 0.3 - 1.2 mg/dL   GFR calc non Af Amer >60 >60 mL/min   GFR calc Af Amer >60 >60 mL/min   Anion gap 7 5 - 15  Ethanol  Result Value Ref Range   Alcohol, Ethyl (B) <10 <10 mg/dL  CBC with Differential  Result Value Ref Range   WBC 12.0 (H) 4.0 - 10.5 K/uL   RBC 3.47 (L) 3.87 - 5.11 MIL/uL   Hemoglobin 11.0 (L) 12.0 - 15.0 g/dL   HCT 35.6 (L) 36.0 - 46.0 %   MCV 102.6 (H) 80.0 - 100.0 fL   MCH 31.7 26.0 - 34.0 pg   MCHC 30.9 30.0 - 36.0 g/dL   RDW 12.4 11.5 - 15.5 %   Platelets 118 (L) 150 - 400 K/uL   nRBC 0.0 0.0 - 0.2 %   Neutrophils Relative % 82 %   Neutro Abs 9.7 (H) 1.7 - 7.7 K/uL   Lymphocytes Relative 11 %   Lymphs Abs 1.3 0.7 - 4.0 K/uL   Monocytes Relative 7 %   Monocytes Absolute 0.8 0.1 - 1.0 K/uL   Eosinophils Relative 0 %   Eosinophils Absolute 0.0 0.0 - 0.5 K/uL   Basophils Relative 0 %   Basophils Absolute 0.0 0.0 - 0.1 K/uL   Immature Granulocytes 0 %   Abs  Immature Granulocytes 0.05 0.00 - 0.07 K/uL  Ammonia  Result Value Ref Range   Ammonia 23 9 - 35 umol/L  Troponin I - Once  Result Value Ref Range   Troponin I <0.03 <0.03 ng/mL  Acetaminophen level  Result Value Ref Range   Acetaminophen (Tylenol), Serum 42 (H) 10 - 30 ug/mL  Salicylate level  Result Value Ref Range   Salicylate Lvl <7.9 2.8 - 30.0 mg/dL  Protime-INR  Result Value Ref Range   Prothrombin Time 13.7 11.4 - 15.2 seconds   INR 1.06   Magnesium  Result Value Ref Range   Magnesium 1.3 (L) 1.7 - 2.4 mg/dL   Dg Chest 1 View Result Date: 06/19/2018 CLINICAL DATA:  Fall yesterday with right-sided pain, initial encounter EXAM: CHEST  1 VIEW COMPARISON:  12/20/2017 FINDINGS: Cardiac shadow is within normal limits. Aortic calcifications are seen. The lungs are well aerated bilaterally. Patchy infiltrative changes are noted within left lung. No sizable effusion is seen. No acute bony abnormality is noted. Old rib fractures on the left are seen. IMPRESSION: Patchy left lung infiltrate. Electronically Signed   By: Inez Catalina M.D.   On: 06/19/2018 15:50   Ct Head Wo Contrast Result Date: 06/19/2018 CLINICAL DATA:  Fall, subacute head trauma, headache and neck pain EXAM:  CT HEAD WITHOUT CONTRAST CT CERVICAL SPINE WITHOUT CONTRAST TECHNIQUE: Multidetector CT imaging of the head and cervical spine was performed following the standard protocol without intravenous contrast. Multiplanar CT image reconstructions of the cervical spine were also generated. COMPARISON:  03/18/2018, 04/01/2017 FINDINGS: CT HEAD FINDINGS Brain: Stable atrophy pattern and minor white matter microvascular changes. No acute intracranial hemorrhage, definite new infarction, midline shift, herniation, hydrocephalus, or extra-axial fluid collection. No focal mass effect or edema. Cisterns are patent. Cerebellar atrophy as well. Vascular: Intracranial atherosclerosis. No hyperdense vessel. Skull: Normal. Negative for  fracture or focal lesion. Sinuses/Orbits: No acute finding. Other: None. CT CERVICAL SPINE FINDINGS Alignment: No malalignment. Facets are aligned. No subluxation or dislocation. Previous fusion at C6-7 Skull base and vertebrae: No acute fracture. No primary bone lesion or focal pathologic process. Soft tissues and spinal canal: No prevertebral fluid or swelling. No visible canal hematoma. Disc levels: Multilevel cervical degenerative spondylosis throughout the entire cervical spine and posterior facet arthropathy. these changes are most severe at C5-6 and C7-T1 with marked disc space narrowing, sclerosis osteophyte template formation. Degenerative changes of the C1-2 articulation as well. Upper chest: Left upper lobe septal thickening mild patchy airspace opacity concerning for component asymmetric edema. Difficult to exclude pneumonia. Coronary atherosclerosis noted. Other: none IMPRESSION: Stable noncontrast head CT without interval change or acute finding. Stable cervical spine degenerative spondylosis as detailed without acute osseous finding or malalignment Left upper lobe interlobular septal thickening and mild patchy airspace process concerning for asymmetric edema and/or pneumonia. Electronically Signed   By: Jerilynn Mages.  Shick M.D.   On: 06/19/2018 16:20   Ct Cervical Spine Wo Contrast Result Date: 06/19/2018 CLINICAL DATA:  Fall, subacute head trauma, headache and neck pain EXAM: CT HEAD WITHOUT CONTRAST CT CERVICAL SPINE WITHOUT CONTRAST TECHNIQUE: Multidetector CT imaging of the head and cervical spine was performed following the standard protocol without intravenous contrast. Multiplanar CT image reconstructions of the cervical spine were also generated. COMPARISON:  03/18/2018, 04/01/2017 FINDINGS: CT HEAD FINDINGS Brain: Stable atrophy pattern and minor white matter microvascular changes. No acute intracranial hemorrhage, definite new infarction, midline shift, herniation, hydrocephalus, or extra-axial  fluid collection. No focal mass effect or edema. Cisterns are patent. Cerebellar atrophy as well. Vascular: Intracranial atherosclerosis. No hyperdense vessel. Skull: Normal. Negative for fracture or focal lesion. Sinuses/Orbits: No acute finding. Other: None. CT CERVICAL SPINE FINDINGS Alignment: No malalignment. Facets are aligned. No subluxation or dislocation. Previous fusion at C6-7 Skull base and vertebrae: No acute fracture. No primary bone lesion or focal pathologic process. Soft tissues and spinal canal: No prevertebral fluid or swelling. No visible canal hematoma. Disc levels: Multilevel cervical degenerative spondylosis throughout the entire cervical spine and posterior facet arthropathy. these changes are most severe at C5-6 and C7-T1 with marked disc space narrowing, sclerosis osteophyte template formation. Degenerative changes of the C1-2 articulation as well. Upper chest: Left upper lobe septal thickening mild patchy airspace opacity concerning for component asymmetric edema. Difficult to exclude pneumonia. Coronary atherosclerosis noted. Other: none IMPRESSION: Stable noncontrast head CT without interval change or acute finding. Stable cervical spine degenerative spondylosis as detailed without acute osseous finding or malalignment Left upper lobe interlobular septal thickening and mild patchy airspace process concerning for asymmetric edema and/or pneumonia. Electronically Signed   By: Jerilynn Mages.  Shick M.D.   On: 06/19/2018 16:20   Dg Hand Complete Right Result Date: 06/19/2018 CLINICAL DATA:  Right hand pain since falling yesterday. EXAM: RIGHT HAND - COMPLETE 3+ VIEW COMPARISON:  Wrist radiographs 05/24/2018.  FINDINGS: Patient unable to remove the ring from the right index finger. The bones are demineralized. No evidence of acute fracture or dislocation. There are minimal interphalangeal degenerative changes. No focal soft tissue swelling or foreign body identified. IMPRESSION: No evidence of acute  right hand injury. Electronically Signed   By: Richardean Sale M.D.   On: 06/19/2018 15:50   Dg Hip Unilat With Pelvis 2-3 Views Right Result Date: 06/19/2018 CLINICAL DATA:  Fall yesterday.  Right hip pain. EXAM: DG HIP (WITH OR WITHOUT PELVIS) 2-3V RIGHT COMPARISON:  Radiographs 12/20/2017. FINDINGS: The bones are diffusely demineralized. No evidence of acute fracture, dislocation or femoral head avascular necrosis. There are stable minimal degenerative changes involving the hips and sacroiliac joints bilaterally. IMPRESSION: Stable examination without evidence of acute osseous injury. If the patient has persistent hip pain or inability to bear weight, follow up imaging may be warranted as acute hip fractures can be radiographically occult in the elderly. Electronically Signed   By: Richardean Sale M.D.   On: 06/19/2018 15:48    1430:  Per EMS, pt likely has been over-using her norco for the past several days. IV narcan given with good effect. Will continue to monitor as workup progresses.  1555:  Potassium and magnesium repleted IV. Calcium corrects to 13.1 for albumin; IVF given. IV abx started for CAP. APAP level mildly elevated in the setting of normal LFT's, PT/INR, chronic norco over-use requiring IV narcan to arouse pt on arrival to ED. Poison Control called: recommends to start NAC either IV or PO, admit (they will fax over protocol).   1645:  Pt continues awake/alert, yelling at ED staff. BP and HR improved after narcan. T/C returned from [facility] Dr. Marlowe Sax, case discussed, including:  HPI, pertinent PM/SHx, VS/PE, dx testing, ED course and treatment:  Agreeable to admit.     Final Clinical Impressions(s) / ED Diagnoses   Final diagnoses:  None    ED Discharge Orders    None       Francine Graven, DO 06/24/18 1217

## 2018-06-19 NOTE — Progress Notes (Signed)
MEDICATION RELATED CONSULT NOTE - INITIAL   Pharmacy Consult for Acetadote Indication: Tylenol OD  Allergies  Allergen Reactions  . Pregabalin     REACTION: "couldn't move"    Patient Measurements: Height: 5\' 2"  (157.5 cm) Weight: 123 lb 7.3 oz (56 kg) IBW/kg (Calculated) : 50.1  Vital Signs: Temp: 98.8 F (37.1 C) (01/22 1308) Temp Source: Oral (01/22 1308) BP: 126/62 (01/22 1630) Pulse Rate: 79 (01/22 1630) Intake/Output from previous day: No intake/output data recorded. Intake/Output from this shift: No intake/output data recorded.  Labs: Recent Labs    06/19/18 1424  WBC 12.0*  HGB 11.0*  HCT 35.6*  PLT 118*  CREATININE 0.83  MG 1.3*  ALBUMIN 2.8*  PROT 6.0*  AST 41  ALT 17  ALKPHOS 94  BILITOT 0.4   Estimated Creatinine Clearance: 49.9 mL/min (by C-G formula based on SCr of 0.83 mg/dL).   Microbiology: No results found for this or any previous visit (from the past 720 hour(s)).  Medical History: Past Medical History:  Diagnosis Date  . Acid reflux   . Alcohol abuse, in remission   . Arthritis   . Bipolar 1 disorder ([place])   . Chronic back pain    Dr Merlene Laughter  . Chronic constipation   . COPD (chronic obstructive pulmonary disease) ([place])   . Depression   . ETOH abuse   . Hypertension   . Neurotic depression   . Personality disorder ([place])   . S/P colonoscopy 12/28/05   Dr Catalina Antigua prep, lipoma left colon  . 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 Seizures ([place])   . Sinus drainage     Medications:  See med rec  Assessment: 73 yo female brought to ED d/t fall. Per EMS and pts cargivers she had fallen yesterday and today. Patient having right hand and hip pains. Patient taking pain pills and EMS was told the pt had rx norco filled 06/14/18, #120, and only around 90 pills were present. Poison Control was contacted by ED RN and was instructed to start Acetadote protocol d/t acutely elevated tylenol level.   Goal of Therapy:   Prevent liver damage  Plan:  Start Acetadote 150mg /kg loading dose, then 15mg /kg /hr F/U labs including LFTs and BMET and tylenol levels per poison control  Isac Sarna, BS [place]ena [place], [place] Pharmacist Pager [phone] 06/19/2018,4:50 PM

## 2018-06-19 NOTE — ED Notes (Signed)
Pt is more alert and answering questions appropriately after narcan

## 2018-06-19 NOTE — H&P (Addendum)
History and Physical    Ashley Hall VPX:[id number] DOB: 02/29/1946 DOA: 06/19/2018  PCP: Lucia Gaskins, MD Patient coming from: Home  Chief Complaint: AMS  HPI: Ashley Hall is a 73 y.o. female with medical history significant of GERD, alcohol abuse, arthritis, bipolar 1 disorder, chronic back pain, COPD, depression, hypertension, seizures presenting to the hospital via EMS for evaluation of AMS.  Per triage note, caregiver reported that patient fell yesterday.  She had a bottle of Norco at home EMS thinks she may be taking more pills than what is prescribed.  Norco was filled 06/14/2018, #120 and only 90 pills were present in the bottle.  Patient was somnolent on arrival but became more awake and alert after receiving a dose of Narcan.  Patient AAO x3 but very slow to respond to questions.  It was difficult to obtain a thorough history from her.  Reports taking Norco 2 tablets every 4 hours normally but thinks she might have taken 4 tablets this morning.  Reports taking Tylenol but is not sure how much.  States she falls at home all the time and yesterday again lost her balance and fell.  Denies loss of consciousness.  Does report having substernal chest pain at that time and dyspnea.  Denies having any associated diaphoresis or nausea.  Reports having fevers, chills, and a cough.  Reports having pain in her lower back and right hip since the fall.  No additional history could be obtained from the patient.  Review of Systems: As per HPI otherwise 10 point review of systems negative.  Past Medical History:  Diagnosis Date  . Acid reflux   . Alcohol abuse, in remission   . Arthritis   . Bipolar 1 disorder ([place])   . Chronic back pain    Dr Merlene Laughter  . Chronic constipation   . COPD (chronic obstructive pulmonary disease) ([place])   . Depression   . ETOH abuse   . Hypertension   . Neurotic depression   . Personality disorder ([place])   . S/P colonoscopy 12/28/05   Dr Catalina Antigua  prep, lipoma left colon  . 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 Seizures ([place])   . Sinus drainage     Past Surgical History:  Procedure Laterality Date  . APPENDECTOMY    . complete hysterectomy    . ESOPHAGOGASTRODUODENOSCOPY N/A 06/16/2016   Procedure: ESOPHAGOGASTRODUODENOSCOPY (EGD);  Surgeon: Rogene Houston, MD;  Location: [facility];  Service: Endoscopy;  Laterality: N/A;  1200     reports that she has been smoking cigarettes. She has a 24.00 pack-year smoking history. She has never used smokeless tobacco. She reports current alcohol use. She reports that she does not use drugs.  Allergies  Allergen Reactions  . Pregabalin     REACTION: "couldn't move"    Family History  Problem Relation Age of Onset  . Heart attack Mother        Deceased  . Diabetes Mother        Deceased  . Cancer Father        Deceased  . Schizophrenia Brother     Prior to Admission medications   Medication Sig Start Date End Date Taking? Authorizing Provider  ALPRAZolam Duanne Moron) 0.5 MG tablet Take 1 tablet (0.5 mg total) by mouth at bedtime as needed for anxiety. 12/20/17   Nat Christen, MD  amLODipine (NORVASC) 5 MG tablet Take 5 mg by mouth daily.      [provider]  Ascorbic Acid (VITAMIN C) 1000 MG tablet Take 1,000 mg by mouth daily.      [provider]  Aspirin-Salicylamide-Caffeine 951-884-16.6 MG PACK Take 1 Package by mouth as needed.    [provider]  B Complex-C (SUPER B COMPLEX PO) Take by mouth.    [provider]  ferrous gluconate (FERGON) 325 MG tablet Take 325 mg by mouth daily with breakfast.      [provider]  fish oil-omega-3 fatty acids 1000 MG capsule Take 2 g by mouth daily.      [provider]  gabapentin (NEURONTIN) 300 MG capsule Take 300 mg by mouth Daily. 11/04/10   [provider]  ibuprofen (ADVIL,MOTRIN) 400 MG tablet TAKE TWO (2) TABLETS BY MOUTH EVERY 6 TO 8 HOURS AS NEEDED.  10/15/17   [provider]  levothyroxine (SYNTHROID, LEVOTHROID) 50 MCG tablet Take 50 mcg by mouth daily. 04/08/14   [provider]  magnesium 30 MG tablet Take 500 mg by mouth 2 (two) times daily.    [provider]  metoprolol tartrate (LOPRESSOR) 25 MG tablet Take 37.5 mg by mouth 2 (two) times daily.  11/16/10   [provider]  Multiple Vitamin (MULTIVITAMIN) capsule Take 1 capsule by mouth daily.      [provider]  oxyCODONE-acetaminophen (PERCOCET/ROXICET) 5-325 MG tablet Take 1 tablet by mouth every 4 (four) hours as needed for severe pain. 12/20/17   Nat Christen, MD  pantoprazole (PROTONIX) 40 MG tablet TAKE ONE TABLET BY MOUTH TWICE DAILY. TAKE BEFORE A MEAL. (MORNING ,EVENING) 01/24/18   Setzer, Rona Ravens, NP  PARoxetine (PAXIL) 20 MG tablet Take 1 tablet by mouth daily. 11/30/17   [provider]  potassium chloride SA (K-DUR,KLOR-CON) 20 MEQ tablet Take 1 tablet (20 mEq total) by mouth 2 (two) times daily. 12/20/17   Nat Christen, MD  pravastatin (PRAVACHOL) 40 MG tablet Take 1 tablet (40 mg total) by mouth at bedtime. 05/08/14   Lucia Gaskins, MD  PROAIR HFA 108 (90 BASE) MCG/ACT inhaler Inhale 2 puffs into the lungs 4 (four) times daily as needed for wheezing or shortness of breath.  09/15/14   [provider]  topiramate (TOPAMAX) 100 MG tablet Take 100 mg by mouth 2 (two) times daily.      [provider]  traZODone (DESYREL) 150 MG tablet Take 2 tablets by mouth at bedtime. 09/30/14   [provider]  vitamin A 10000 UNIT capsule Take 8,000 Units by mouth daily.    [provider]  vitamin E 200 UNIT capsule Take 400 Units by mouth daily.    [provider]    Physical Exam: Vitals:   06/19/18 1304 06/19/18 1308 06/19/18 1448 06/19/18 1630  BP:  (!) 97/51  126/62  Pulse:  (!) 56 64 79  Resp:  18 19   Temp:  98.8 F (37.1 C)    TempSrc:  Oral    SpO2:  96% 100% 100%  Weight: 56  kg     Height: 5\' 2"  (1.575 m)       Physical Exam  Constitutional: She is oriented to person, place, and time.  Frail elderly female  HENT:  Head: Normocephalic.  Mouth/Throat: Oropharynx is clear and moist.  Eyes:  Pupils dilated (received narcan) but reactive to light  Neck: Neck supple.  Cardiovascular: Normal rate, regular rhythm and intact distal pulses.  Pulmonary/Chest: Effort normal and breath sounds normal. No respiratory distress. She has no wheezes. She  has no rales.  Abdominal: Soft. Bowel sounds are normal. She exhibits no distension. There is no abdominal tenderness.  Musculoskeletal:        General: No edema.     Comments: Lumbar spine tender to palpation Right hip tender to palpation Bruise noted on the dorsal surface of the right hand  Neurological: She is alert and oriented to person, place, and time.  AAO x3 but very slow to respond to questions. No facial droop, tongue midline. Strength 5 out of 5 in bilateral upper and lower extremities. Sensation to light touch intact throughout.  Skin: Skin is warm and dry.     Labs on Admission: I have personally reviewed following labs and imaging studies  CBC: Recent Labs  Lab 06/19/18 1424  WBC 12.0*  NEUTROABS 9.7*  HGB 11.0*  HCT 35.6*  MCV 102.6*  PLT 629*   Basic Metabolic Panel: Recent Labs  Lab 06/19/18 1424  NA 139  K 2.9*  CL 105  CO2 27  GLUCOSE 110*  BUN 9  CREATININE 0.83  CALCIUM 12.1*  MG 1.3*   GFR: Estimated Creatinine Clearance: 49.9 mL/min (by C-G formula based on SCr of 0.83 mg/dL). Liver Function Tests: Recent Labs  Lab 06/19/18 1424  AST 41  ALT 17  ALKPHOS 94  BILITOT 0.4  PROT 6.0*  ALBUMIN 2.8*   No results for input(s): LIPASE, AMYLASE in the last 168 hours. Recent Labs  Lab 06/19/18 1424  AMMONIA 23   Coagulation Profile: Recent Labs  Lab 06/19/18 1424  INR 1.06   Cardiac Enzymes: Recent Labs  Lab 06/19/18 1424  TROPONINI <0.03   BNP (last 3  results) No results for input(s): PROBNP in the last 8760 hours. HbA1C: No results for input(s): HGBA1C in the last 72 hours. CBG: No results for input(s): GLUCAP in the last 168 hours. Lipid Profile: No results for input(s): CHOL, HDL, LDLCALC, TRIG, CHOLHDL, LDLDIRECT in the last 72 hours. Thyroid Function Tests: No results for input(s): TSH, T4TOTAL, FREET4, T3FREE, THYROIDAB in the last 72 hours. Anemia Panel: No results for input(s): VITAMINB12, FOLATE, FERRITIN, TIBC, IRON, RETICCTPCT in the last 72 hours. Urine analysis:    Component Value Date/Time   COLORURINE STRAW (A) 04/01/2017 0142   APPEARANCEUR CLEAR 04/01/2017 0142   LABSPEC 1.005 04/01/2017 0142   PHURINE 6.0 04/01/2017 0142   GLUCOSEU NEGATIVE 04/01/2017 0142   HGBUR NEGATIVE 04/01/2017 0142   BILIRUBINUR NEGATIVE 04/01/2017 0142   KETONESUR NEGATIVE 04/01/2017 0142   PROTEINUR NEGATIVE 04/01/2017 0142   UROBILINOGEN 0.2 05/06/2014 1225   NITRITE NEGATIVE 04/01/2017 0142   LEUKOCYTESUR NEGATIVE 04/01/2017 0142    Radiological Exams on Admission: Dg Chest 1 View  Result Date: 06/19/2018 CLINICAL DATA:  Fall yesterday with right-sided pain, initial encounter EXAM: CHEST  1 VIEW COMPARISON:  12/20/2017 FINDINGS: Cardiac shadow is within normal limits. Aortic calcifications are seen. The lungs are well aerated bilaterally. Patchy infiltrative changes are noted within left lung. No sizable effusion is seen. No acute bony abnormality is noted. Old rib fractures on the left are seen. IMPRESSION: Patchy left lung infiltrate. Electronically Signed   By: Inez Catalina M.D.   On: 06/19/2018 15:50   Ct Head Wo Contrast  Result Date: 06/19/2018 CLINICAL DATA:  Fall, subacute head trauma, headache and neck pain EXAM: CT HEAD WITHOUT CONTRAST CT CERVICAL SPINE WITHOUT CONTRAST TECHNIQUE: Multidetector CT imaging of the head and cervical spine was performed following the standard protocol without intravenous contrast. Multiplanar  CT image  reconstructions of the cervical spine were also generated. COMPARISON:  03/18/2018, 04/01/2017 FINDINGS: CT HEAD FINDINGS Brain: Stable atrophy pattern and minor white matter microvascular changes. No acute intracranial hemorrhage, definite new infarction, midline shift, herniation, hydrocephalus, or extra-axial fluid collection. No focal mass effect or edema. Cisterns are patent. Cerebellar atrophy as well. Vascular: Intracranial atherosclerosis. No hyperdense vessel. Skull: Normal. Negative for fracture or focal lesion. Sinuses/Orbits: No acute finding. Other: None. CT CERVICAL SPINE FINDINGS Alignment: No malalignment. Facets are aligned. No subluxation or dislocation. Previous fusion at C6-7 Skull base and vertebrae: No acute fracture. No primary bone lesion or focal pathologic process. Soft tissues and spinal canal: No prevertebral fluid or swelling. No visible canal hematoma. Disc levels: Multilevel cervical degenerative spondylosis throughout the entire cervical spine and posterior facet arthropathy. these changes are most severe at C5-6 and C7-T1 with marked disc space narrowing, sclerosis osteophyte template formation. Degenerative changes of the C1-2 articulation as well. Upper chest: Left upper lobe septal thickening mild patchy airspace opacity concerning for component asymmetric edema. Difficult to exclude pneumonia. Coronary atherosclerosis noted. Other: none IMPRESSION: Stable noncontrast head CT without interval change or acute finding. Stable cervical spine degenerative spondylosis as detailed without acute osseous finding or malalignment Left upper lobe interlobular septal thickening and mild patchy airspace process concerning for asymmetric edema and/or pneumonia. Electronically Signed   By: Jerilynn Mages.  Shick M.D.   On: 06/19/2018 16:20   Ct Cervical Spine Wo Contrast  Result Date: 06/19/2018 CLINICAL DATA:  Fall, subacute head trauma, headache and neck pain EXAM: CT HEAD WITHOUT CONTRAST CT  CERVICAL SPINE WITHOUT CONTRAST TECHNIQUE: Multidetector CT imaging of the head and cervical spine was performed following the standard protocol without intravenous contrast. Multiplanar CT image reconstructions of the cervical spine were also generated. COMPARISON:  03/18/2018, 04/01/2017 FINDINGS: CT HEAD FINDINGS Brain: Stable atrophy pattern and minor white matter microvascular changes. No acute intracranial hemorrhage, definite new infarction, midline shift, herniation, hydrocephalus, or extra-axial fluid collection. No focal mass effect or edema. Cisterns are patent. Cerebellar atrophy as well. Vascular: Intracranial atherosclerosis. No hyperdense vessel. Skull: Normal. Negative for fracture or focal lesion. Sinuses/Orbits: No acute finding. Other: None. CT CERVICAL SPINE FINDINGS Alignment: No malalignment. Facets are aligned. No subluxation or dislocation. Previous fusion at C6-7 Skull base and vertebrae: No acute fracture. No primary bone lesion or focal pathologic process. Soft tissues and spinal canal: No prevertebral fluid or swelling. No visible canal hematoma. Disc levels: Multilevel cervical degenerative spondylosis throughout the entire cervical spine and posterior facet arthropathy. these changes are most severe at C5-6 and C7-T1 with marked disc space narrowing, sclerosis osteophyte template formation. Degenerative changes of the C1-2 articulation as well. Upper chest: Left upper lobe septal thickening mild patchy airspace opacity concerning for component asymmetric edema. Difficult to exclude pneumonia. Coronary atherosclerosis noted. Other: none IMPRESSION: Stable noncontrast head CT without interval change or acute finding. Stable cervical spine degenerative spondylosis as detailed without acute osseous finding or malalignment Left upper lobe interlobular septal thickening and mild patchy airspace process concerning for asymmetric edema and/or pneumonia. Electronically Signed   By: Jerilynn Mages.  Shick  M.D.   On: 06/19/2018 16:20   Dg Hand Complete Right  Result Date: 06/19/2018 CLINICAL DATA:  Right hand pain since falling yesterday. EXAM: RIGHT HAND - COMPLETE 3+ VIEW COMPARISON:  Wrist radiographs 05/24/2018. FINDINGS: Patient unable to remove the ring from the right index finger. The bones are demineralized. No evidence of acute fracture or dislocation. There are minimal interphalangeal degenerative changes.  No focal soft tissue swelling or foreign body identified. IMPRESSION: No evidence of acute right hand injury. Electronically Signed   By: Richardean Sale M.D.   On: 06/19/2018 15:50   Dg Hip Unilat With Pelvis 2-3 Views Right  Result Date: 06/19/2018 CLINICAL DATA:  Fall yesterday.  Right hip pain. EXAM: DG HIP (WITH OR WITHOUT PELVIS) 2-3V RIGHT COMPARISON:  Radiographs 12/20/2017. FINDINGS: The bones are diffusely demineralized. No evidence of acute fracture, dislocation or femoral head avascular necrosis. There are stable minimal degenerative changes involving the hips and sacroiliac joints bilaterally. IMPRESSION: Stable examination without evidence of acute osseous injury. If the patient has persistent hip pain or inability to bear weight, follow up imaging may be warranted as acute hip fractures can be radiographically occult in the elderly. Electronically Signed   By: Richardean Sale M.D.   On: 06/19/2018 15:48    EKG: Independently reviewed.  Sinus bradycardia (heart rate 54), T wave inversions in inferior leads (II, avF) and V3.  T wave inversions in inferior leads are new compared to prior EKG from July 2019.  Assessment/Plan Principal Problem:   Acute encephalopathy Active Problems:   CAP (community acquired pneumonia)   Opioid overdose ([place])   Acetaminophen toxicity   Fall at home, initial encounter   Chest pain   Hypokalemia   Hypomagnesemia   Hypercalcemia  Acute encephalopathy secondary to opioid overdose -Per EMS, patient took more Norco than what was prescribed  to her. Norco was filled 06/14/2018, #120 and only 90 pills were present in the bottle.  Patient was somnolent on arrival but became more awake and alert after receiving a dose of Narcan.  Currently AAO x3 but very slow to respond to questions. -Salicylate level negative, ammonia level normal, blood ethanol level negative -Hold opioids and sedating medications -UA pending -UDS pending -Continue to monitor hemodynamics  Acetaminophen toxicity In the setting of overdosing on Norco.  Acetaminophen level elevated at 42.  LFTs normal, PT/INR normal. -Poison control was contacted by the ED and recommended giving N-acetylcysteine.  -N-acetylcysteine per pharmacy -Continue to monitor LFTs, PT/INR, acetaminophen levels -Cardiac monitoring  Fall -Patient fell at home yesterday.  She attributes the fall to losing balance and denies losing consciousness.   -CT head negative for acute abnormality.  CT C-spine negative for acute abnormality. -Bruise noted on the dorsum of the right hand.  X-ray negative for acute right hand injury. -Patient is complaining of lower back pain and right hip pain.  X-ray of right hip negative for fracture.  Lumbar spine tender to palpation on exam, will order x-ray to rule out fracture. -PT evaluation -Check orthostatics -IV fluid hydration  Chest pain Patient reports having substernal chest pain yesterday at the time of her fall.  No documented history of CAD in the chart.   EKG showing new T wave inversions in inferior leads.  Troponin negative.   -Cardiac monitoring -Continue to trend troponin -Echocardiogram  CAP -Afebrile.  White count 12.0.  Chest x-ray showing patchy left lung infiltrate.  Placed on 2 L supplemental oxygen in the ED as patient was somnolent on arrival in the setting of opioid overdose. -Ceftriaxone and azithromycin -Continue to monitor CBC  Hypokalemia, hypomagnesemia Potassium 2.9.  Magnesium level 1.3.  EKG with new T wave inversions in  inferior leads. -Replete potassium and magnesium -Continue to monitor electrolytes closely -Cardiac monitoring  Hypercalcemia Corrected calcium 13.1.  Altered mental status in the setting of opioid overdose, improved after Narcan administration.  Hypocalcemia possibly  due to volume depletion.  Patient is currently not on a thiazide diuretic or lithium. -IV fluid hydration -Check ionized calcium, PTH levels -Continue to monitor calcium levels  Anemia Hemoglobin 11.0, recent baseline 12.1-13.1.  Unable to obtain additional history from the patient at this time.  No signs of active bleeding. -Anemia panel -Continue to monitor CBC  Hypothyroidism -Check TSH level -Continue home Synthroid  GERD -Continue PPI  History of alcohol abuse Unable to obtain any history from the patient at this time. -CIWA score monitoring. Hold off giving benzodiazepine/sedating medications at this time unless patient displays signs of alcohol withdrawal. -Thiamine, folate, multivitamin  DVT prophylaxis: Lovenox Code Status: Full code.  Unable to discuss CODE STATUS with the patient at this time.  Please discuss in the morning. Family Communication: No family available. Disposition Plan: Anticipate discharge after clinical improvement. Consults called: None Admission status: Observation, telemetry  Shela Leff MD [facility] Pager [phone]  If 7PM-7AM, please contact night-coverage [email or web address] Password [facility]  06/19/2018, 6:17 PM

## 2018-06-19 NOTE — ED Triage Notes (Signed)
Pt fell yesterday.  Has skin tear to lt arm and bruising on lt side of her body per caregiver at the house.  Pt has rx for norco that has more pills gone than should be.  Ems states pt may have been taking more than rx'd d/t pain. Pt is awake and alert at this time.

## 2018-06-19 NOTE — ED Notes (Signed)
Pt is resting comfortably  Blanket and pillow given to patient

## 2018-06-19 NOTE — ED Notes (Signed)
Attempted to start iv on pt x 2 with no success

## 2018-06-20 ENCOUNTER — Other Ambulatory Visit: Payer: Self-pay

## 2018-06-20 ENCOUNTER — Observation Stay (HOSPITAL_BASED_OUTPATIENT_CLINIC_OR_DEPARTMENT_OTHER): Payer: Medicare Other

## 2018-06-20 DIAGNOSIS — M546 Pain in thoracic spine: Secondary | ICD-10-CM

## 2018-06-20 DIAGNOSIS — R072 Precordial pain: Secondary | ICD-10-CM

## 2018-06-20 DIAGNOSIS — T40601A Poisoning by unspecified narcotics, accidental (unintentional), initial encounter: Secondary | ICD-10-CM

## 2018-06-20 DIAGNOSIS — J189 Pneumonia, unspecified organism: Secondary | ICD-10-CM | POA: Diagnosis not present

## 2018-06-20 DIAGNOSIS — Y92009 Unspecified place in unspecified non-institutional (private) residence as the place of occurrence of the external cause: Secondary | ICD-10-CM

## 2018-06-20 DIAGNOSIS — E440 Moderate protein-calorie malnutrition: Secondary | ICD-10-CM

## 2018-06-20 DIAGNOSIS — W19XXXA Unspecified fall, initial encounter: Secondary | ICD-10-CM

## 2018-06-20 DIAGNOSIS — G9340 Encephalopathy, unspecified: Secondary | ICD-10-CM | POA: Diagnosis not present

## 2018-06-20 DIAGNOSIS — T391X1A Poisoning by 4-Aminophenol derivatives, accidental (unintentional), initial encounter: Secondary | ICD-10-CM

## 2018-06-20 DIAGNOSIS — E876 Hypokalemia: Secondary | ICD-10-CM

## 2018-06-20 LAB — COMPREHENSIVE METABOLIC PANEL
ALT: 16 U/L (ref 0–44)
ALT: 16 U/L (ref 0–44)
AST: 38 U/L (ref 15–41)
AST: 36 U/L (ref 15–41)
Albumin: 2.7 g/dL — ABNORMAL LOW (ref 3.5–5.0)
Albumin: 2.6 g/dL — ABNORMAL LOW (ref 3.5–5.0)
Alkaline Phosphatase: 82 U/L (ref 38–126)
Alkaline Phosphatase: 87 U/L (ref 38–126)
Anion gap: 10 (ref 5–15)
Anion gap: 6 (ref 5–15)
BUN: 9 mg/dL (ref 8–23)
BUN: 7 mg/dL — ABNORMAL LOW (ref 8–23)
CO2: 22 mmol/L (ref 22–32)
CO2: 23 mmol/L (ref 22–32)
Calcium: 11.1 mg/dL — ABNORMAL HIGH (ref 8.9–10.3)
Calcium: 11 mg/dL — ABNORMAL HIGH (ref 8.9–10.3)
Chloride: 106 mmol/L (ref 98–111)
Chloride: 110 mmol/L (ref 98–111)
Creatinine, Ser: 0.65 mg/dL (ref 0.44–1.00)
Creatinine, Ser: 0.63 mg/dL (ref 0.44–1.00)
GFR calc Af Amer: >60 mL/min (ref >60)
GFR calc non Af Amer: >60 mL/min (ref >60)
GFR calc non Af Amer: >60 mL/min (ref >60)
Glucose, Bld: 109 mg/dL — ABNORMAL HIGH (ref 70–99)
Glucose, Bld: 119 mg/dL — ABNORMAL HIGH (ref 70–99)
Potassium: 2.6 mmol/L — CL (ref 3.5–5.1)
Potassium: 3.1 mmol/L — ABNORMAL LOW (ref 3.5–5.1)
Sodium: 138 mmol/L (ref 135–145)
Sodium: 139 mmol/L (ref 135–145)
Total Bilirubin: 0.8 mg/dL (ref 0.3–1.2)
Total Bilirubin: 0.3 mg/dL (ref 0.3–1.2)
Total Protein: 5.9 g/dL — ABNORMAL LOW (ref 6.5–8.1)
Total Protein: 5.9 g/dL — ABNORMAL LOW (ref 6.5–8.1)

## 2018-06-20 LAB — IRON AND TIBC
Iron: 99 ug/dL (ref 28–170)
SATURATION RATIOS: 61 % — AB (ref 10.4–31.8)
TIBC: 164 ug/dL — ABNORMAL LOW (ref 250–450)
UIBC: 65 ug/dL

## 2018-06-20 LAB — HEPATIC FUNCTION PANEL
ALBUMIN: 2.4 g/dL — AB (ref 3.5–5.0)
ALT: 15 U/L (ref 0–44)
AST: 36 U/L (ref 15–41)
Alkaline Phosphatase: 83 U/L (ref 38–126)
Bilirubin, Direct: 0.2 mg/dL (ref 0.0–0.2)
Indirect Bilirubin: 0.7 mg/dL (ref 0.3–0.9)
TOTAL PROTEIN: 5.6 g/dL — AB (ref 6.5–8.1)
Total Bilirubin: 0.9 mg/dL (ref 0.3–1.2)

## 2018-06-20 LAB — PARATHYROID HORMONE, INTACT (NO CA): PTH: 30 pg/mL (ref 15–65)

## 2018-06-20 LAB — CBC
HCT: 34.7 % — ABNORMAL LOW (ref 36.0–46.0)
Hemoglobin: 11.3 g/dL — ABNORMAL LOW (ref 12.0–15.0)
MCH: 32.1 pg (ref 26.0–34.0)
MCHC: 32.6 g/dL (ref 30.0–36.0)
MCV: 98.6 fL (ref 80.0–100.0)
NRBC: 0 % (ref 0.0–0.2)
Platelets: 122 10*3/uL — ABNORMAL LOW (ref 150–400)
RBC: 3.52 MIL/uL — ABNORMAL LOW (ref 3.87–5.11)
RDW: 12.1 % (ref 11.5–15.5)
WBC: 12.1 10*3/uL — ABNORMAL HIGH (ref 4.0–10.5)

## 2018-06-20 LAB — TROPONIN I
Troponin I: <0.03 ng/mL (ref <0.03)
Troponin I: <0.03 ng/mL (ref <0.03)

## 2018-06-20 LAB — ECHOCARDIOGRAM COMPLETE
Height: 62 in
Weight: 1975.32 oz

## 2018-06-20 LAB — TSH: TSH: 0.747 u[IU]/mL (ref 0.350–4.500)

## 2018-06-20 LAB — CALCIUM, IONIZED: Calcium, Ionized, Serum: 7 mg/dL — ABNORMAL HIGH (ref 4.5–5.6)

## 2018-06-20 LAB — MAGNESIUM: Magnesium: 1.8 mg/dL (ref 1.7–2.4)

## 2018-06-20 LAB — ACETAMINOPHEN LEVEL: Acetaminophen (Tylenol), Serum: <10 ug/mL — ABNORMAL LOW (ref 10–30)

## 2018-06-20 LAB — FERRITIN: Ferritin: 1389 ng/mL — ABNORMAL HIGH (ref 11–307)

## 2018-06-20 LAB — VITAMIN B12: Vitamin B-12: 248 pg/mL (ref 180–914)

## 2018-06-20 LAB — HIV ANTIBODY (ROUTINE TESTING W REFLEX): HIV Screen 4th Generation wRfx: NONREACTIVE

## 2018-06-20 LAB — FOLATE: Folate: 12.3 ng/mL (ref >5.9)

## 2018-06-20 MED ORDER — POTASSIUM CHLORIDE 10 MEQ/100ML IV SOLN
10.0000 meq | INTRAVENOUS | Status: AC
  Administered 2018-06-20: 10 meq via INTRAVENOUS

## 2018-06-20 MED ORDER — AMOXICILLIN-POT CLAVULANATE 875-125 MG PO TABS
1.0000 | ORAL_TABLET | Freq: Two times a day (BID) | ORAL | Status: DC
  Administered 2018-06-20 – 2018-06-27 (×14): 1 via ORAL
  Filled 2018-06-20 (×14): qty 1

## 2018-06-20 MED ORDER — ENSURE ENLIVE PO LIQD
237.0000 mL | Freq: Two times a day (BID) | ORAL | Status: DC
  Administered 2018-06-20 – 2018-06-27 (×12): 237 mL via ORAL

## 2018-06-20 MED ORDER — LORAZEPAM 1 MG PO TABS
1.0000 mg | ORAL_TABLET | Freq: Four times a day (QID) | ORAL | Status: DC | PRN
  Administered 2018-06-20: 1 mg via ORAL
  Filled 2018-06-20: qty 1

## 2018-06-20 MED ORDER — LORAZEPAM 2 MG/ML IJ SOLN
2.0000 mg | Freq: Once | INTRAMUSCULAR | Status: AC
  Administered 2018-06-20: 2 mg via INTRAMUSCULAR
  Filled 2018-06-20: qty 1

## 2018-06-20 MED ORDER — DICLOFENAC EPOLAMINE 1.3 % TD PTCH
1.0000 | MEDICATED_PATCH | Freq: Two times a day (BID) | TRANSDERMAL | Status: DC
  Administered 2018-06-20 – 2018-06-27 (×14): 1 via TRANSDERMAL
  Filled 2018-06-20 (×17): qty 1

## 2018-06-20 MED ORDER — POTASSIUM CHLORIDE CRYS ER 20 MEQ PO TBCR
40.0000 meq | EXTENDED_RELEASE_TABLET | Freq: Once | ORAL | Status: AC
  Administered 2018-06-20: 40 meq via ORAL
  Filled 2018-06-20: qty 2

## 2018-06-20 MED ORDER — POTASSIUM CHLORIDE 10 MEQ/100ML IV SOLN
INTRAVENOUS | Status: AC
  Administered 2018-06-20: 10 meq
  Filled 2018-06-20: qty 100

## 2018-06-20 MED ORDER — LORAZEPAM 2 MG/ML IJ SOLN: 1.0000 mg | Freq: Four times a day (QID) | INTRAMUSCULAR | Status: DC | PRN

## 2018-06-20 MED ORDER — FERROUS GLUCONATE 324 (38 FE) MG PO TABS
324.0000 mg | ORAL_TABLET | Freq: Every day | ORAL | Status: DC
  Administered 2018-06-20 – 2018-06-27 (×8): 324 mg via ORAL
  Filled 2018-06-20 (×10): qty 1

## 2018-06-20 NOTE — Progress Notes (Signed)
MEDICATION RELATED CONSULT NOTE - Follow-Up   Pharmacy Consult for Acetadote Indication: Tylenol OD  Allergies  Allergen Reactions  . Pregabalin     REACTION: "couldn't move"    Patient Measurements: Height: 5\' 2"  (157.5 cm) Weight: 111 lb 12.8 oz (50.7 kg) IBW/kg (Calculated) : 50.1  Vital Signs: Temp: 99 F (37.2 C) (01/23 1813) Temp Source: Oral (01/23 1813) BP: 119/101 (01/23 1813) Pulse Rate: 95 (01/23 1813) Intake/Output from previous day: 01/22 0701 - 01/23 0700 In: 451.4 [I.V.:351.4; IV Piggyback:100] Out: -  Intake/Output from this shift: No intake/output data recorded.  Labs: Recent Labs    06/19/18 1424 06/19/18 1745 06/19/18 2334 06/20/18 0514 06/20/18 1614  WBC 12.0*  --   --  12.1*  --   HGB 11.0*  --   --  11.3*  --   HCT 35.6*  --   --  34.7*  --   PLT 118*  --   --  122*  --   CREATININE 0.83 0.76 0.65 0.63  --   MG 1.3* 1.4* 1.8  --   --   ALBUMIN 2.8* 3.3* 2.7* 2.6* 2.4*  PROT 6.0* 7.1 5.9* 5.9* 5.6*  AST 41 43* 38 36 36  ALT 17 17 16 16 15   ALKPHOS 94 109 82 87 83  BILITOT 0.4 0.8 0.8 0.3 0.9  BILIDIR  --   --   --   --  0.2  IBILI  --   --   --   --  0.7   Estimated Creatinine Clearance: 51.8 mL/min (by C-G formula based on SCr of 0.63 mg/dL).   Assessment: 73 yo female brought to ED d/t fall. Per EMS and pts cargivers she had fallen yesterday and today. Patient having right hand and hip pains. Patient taking pain pills and EMS was told the pt had rx norco filled 06/14/18, #120, and only around 90 pills were present. Poison Control was contacted by ED RN and was instructed to start Acetadote protocol d/t acutely elevated tylenol level.   Goal of Therapy:  Prevent liver damage  Plan:  Discontinue acetylcysteine, per Langley Gauss at [facility] as 1600 LFTs are within normal limits Continue  supportive care as necessary.  Despina Pole, Pharm. D. Clinical Pharmacist 06/20/2018 7:07 PM

## 2018-06-20 NOTE — Progress Notes (Signed)
CRITICAL VALUE ALERT  Critical Value:  Potassium 2.6  Date & Time Notied:  06-20-2018  Provider Notified:  Lazariah Savard-pitts, md  Orders Received/Actions taken:  Received order for potassium supplementation

## 2018-06-20 NOTE — Evaluation (Signed)
Physical Therapy Evaluation Patient Details Name: Ashley Hall MRN: [id number] DOB: Nov 19, 1945 Today's Date: 06/20/2018   History of Present Illness  Ashley Hall is a 73 y.o. female with medical history significant of GERD, alcohol abuse, arthritis, bipolar 1 disorder, chronic back pain, COPD, depression, hypertension, seizures presenting to the hospital via EMS for evaluation of AMS.  Per triage note, caregiver reported that patient fell yesterday.  She had a bottle of Norco at home EMS thinks she may be taking more pills than what is prescribed.  Norco was filled 06/14/2018, #120 and only 90 pills were present in the bottle.  Patient was somnolent on arrival but became more awake and alert after receiving a dose of Narcan.    Clinical Impression  Patient presents supine in bed, initially agitated with mood improving, confused, disoriented to location and situation, but is agreeable to therapy once educated and oriented. Patient below baseline for functional mobility and gait, limited secondary to confusion, generalized weakness and fatigue with activity. Patient requires max assist for supine to sit and min assist for sit to supine transfers demonstrating impulsive behavior, use of bed rail, initially resistive to mobility due to confusion, and limited by pain. Patient unable to stand or attempt transfers or ambulation secondary to pain complaints, weakness, and safety concerns. Patient agitated and confused throughout evaluation, going off on tangents when asked questions, pointing to objects in the room that don't exist, pulling heart monitor leads off, and refusing to use supplemental O2. Patient's O2 sat 89-92% with mobility, but patient unwilling to use supplemental O2 despite education. Patient left supine in bed with bed alarm on and with call bell in reach. Patient will benefit from continued physical therapy in hospital and recommended venue below to increase strength, balance, endurance  for safe ADLs and gait.     Follow Up Recommendations SNF;Supervision/Assistance - 24 hour;Supervision for mobility/OOB    Equipment Recommendations  Rolling walker with 5" wheels;3in1 (PT)    Recommendations for Other Services       Precautions / Restrictions Precautions Precautions: Fall Restrictions Weight Bearing Restrictions: No      Mobility  Bed Mobility Overal bed mobility: Needs Assistance Bed Mobility: Supine to Sit;Sit to Supine     Supine to sit: Max assist Sit to supine: Min assist   General bed mobility comments: impulsive, resistive to movement initially secondary to pain and confusion, increased time, use of bed rail  Transfers Overall transfer level: Needs assistance               General transfer comment: unable secondary to pain complaints and safety concerns  Ambulation/Gait             General Gait Details: unable secondary to pain complaints and safety concerns  Stairs            Wheelchair Mobility    Modified Rankin (Stroke Patients Only)       Balance Overall balance assessment: Needs assistance Sitting-balance support: Feet supported;Bilateral upper extremity supported Sitting balance-Leahy Scale: Poor Sitting balance - Comments: mod assist seated edge of bed secondary to pain complaints, resistive to mobility       Standing balance comment: unable secondary to pain complaints and safety concerns                             Pertinent Vitals/Pain Pain Assessment: Faces Faces Pain Scale: Hurts worst Pain Location: Patient reports buttock/hemorrhoid pain Pain Descriptors /  Indicators: Grimacing;Guarding;Moaning Pain Intervention(s): Limited activity within patient's tolerance;Monitored during session;Repositioned    Home Living Family/patient expects to be discharged to:: Private residence(Patient alert and oriented to self and date, but not oriented to place or situation when information obtained;  no family at bedside to verify) Living Arrangements: Alone Available Help at Discharge: Family;Personal care attendant Type of Home: House Home Access: Level entry     Home Layout: One level Home Equipment: None      Prior Function Level of Independence: Needs assistance(Information obtained from case management who spoke with patient's niece)   Gait / Transfers Assistance Needed: Patient ambualtes around the home with increased incidence of falls  ADL's / Homemaking Assistance Needed: Patient has an aide present 5-7 hours/day on most days who assists with all household activities  Comments: Patient is normally not confused     Hand Dominance        Extremity/Trunk Assessment   Upper Extremity Assessment Upper Extremity Assessment: Generalized weakness    Lower Extremity Assessment Lower Extremity Assessment: Generalized weakness    Cervical / Trunk Assessment Cervical / Trunk Assessment: Normal  Communication   Communication: No difficulties;Other (comment)(Confused, goes off on tangents, requires frequent redirection)  Cognition Arousal/Alertness: Awake/alert Behavior During Therapy: Restless;Agitated;Impulsive Overall Cognitive Status: No family/caregiver present to determine baseline cognitive functioning                                 General Comments: Patient goes on tangents when asked questions requiring redirection, disoriented to location and situation requiring constant reorientation for safety      General Comments      Exercises     Assessment/Plan    PT Assessment Patient needs continued PT services  PT Problem List Decreased strength;Decreased activity tolerance;Decreased balance;Decreased mobility       PT Treatment Interventions Gait training;Functional mobility training;Therapeutic activities;Therapeutic exercise;Patient/family education    PT Goals (Current goals can be found in the Care Plan section)  Acute Rehab PT  Goals Patient Stated Goal: return home PT Goal Formulation: With patient Time For Goal Achievement: 07/04/18 Potential to Achieve Goals: Fair    Frequency Min 2X/week   Barriers to discharge        Co-evaluation               AM-PAC PT "6 Clicks" Mobility  Outcome Measure Help needed turning from your back to your side while in a flat bed without using bedrails?: A Lot Help needed moving from lying on your back to sitting on the side of a flat bed without using bedrails?: A Lot Help needed moving to and from a bed to a chair (including a wheelchair)?: Total Help needed standing up from a chair using your arms (e.g., wheelchair or bedside chair)?: Total Help needed to walk in hospital room?: Total Help needed climbing 3-5 steps with a railing? : Total 6 Click Score: 8    End of Session   Activity Tolerance: Patient limited by fatigue;Treatment limited secondary to agitation Patient left: in bed;with call bell/phone within reach;with bed alarm set Nurse Communication: Mobility status PT Visit Diagnosis: Other abnormalities of gait and mobility (R26.89);Muscle weakness (generalized) (M62.81)    Time: 1638-4665 PT Time Calculation (min) (ACUTE ONLY): 24 min   Charges:   PT Evaluation $PT Eval Moderate Complexity: 1 Mod          12:48 PM, 06/20/18 Talbot Grumbling, DPT Physical Therapist with Larence Penning  [facility] [phone] office

## 2018-06-20 NOTE — Progress Notes (Signed)
RN paged C. Bodenheimer, NP to make him aware patient is agitated, confused, and hallucinating.  Patient states she thinks someone is walking by the window.  Patient has no medication ordered for anxiety, awaiting response of NP.  P.J. Linus Mako, RN

## 2018-06-20 NOTE — Progress Notes (Signed)
Initial Nutrition Assessment  DOCUMENTATION CODES:  Non-severe (moderate) malnutrition in context of social or environmental circumstances  INTERVENTION:  Continue Ensure ENlive BID and MVI with minerals given suspected poor nutrition baseline.  Will monitor level of meal intake  NUTRITION DIAGNOSIS:  Moderate Malnutrition related to social / environmental circumstances (Opioid dependence/abuse though to be negatively impacting nutrition) as evidenced by moderate muscle/fat loss  GOAL:  Patient will meet greater than or equal to 90% of their needs  MONITOR:  PO intake, Supplement acceptance, Diet advancement, Labs, I & O's, Weight trends  REASON FOR ASSESSMENT:  Malnutrition Screening Tool    ASSESSMENT:  73 y/o female PMHx GERD, etoh abuse in remission, Bipolar disorder, personality disorder, neurotic depression, COPD. Presented to ED d/t AMS, with caregiver expressing concern that pt may have overdosed on prescribed opioids. Somnolent on arrival, but more awake after receiving Narcan. Noted to have low K/mag. Admitted for electrolyte repletion and observation.   On RD arrival, pt is awake and agitated, continuously trying to get out of bed. She displays confusion, oriented only to self. She is only able to offer limited information. She says she lives on her own. She reports eating poorly d/t severe, uncontrollable, chronic pain. During our conversation, the vast majority of what she says is directed at her severe pain and her immediate need for opioid analgesics. She says her pain stems from 3 large bruises on her back, but nurse reports nothing being visible on skin assessment.   Bed wt today is 111.8 lbs. She appears to have weighed 122.8 lbs at this time 1 year ago. This is a loss of 9% bw.   Per Case Management, pt does live by herself, but has daily assistance. Caregiver is there 5-7 hrs a day, 5-7 days a week and Niece apparently drops in frequently to on check on her. Niece  reported to CM that pt self medicates and falls frequently.   Visibly, pt appears frail and malnourished. Looks older than stated age. Question if patient is able to receive adequate nutrition with current accommodations.   Pt does not appear to have eaten anything since arrival. Will add MVI with minerals given suspected poor nutritional baseline and continue Ensure while in hospital-though will likely have minimal intake until mental state clears.  Labs: B12: WDL, Albumin:3.3->2.6, Glu:120, K:2.6->3.1, WBC:12.1 Meds: Ensure Enlive BID, PPI, Omega 3s, Vit A, Vit E, VIt C, thiamin, folate, Iron, IV K/Mag.  Recent Labs  Lab 06/19/18 1424 06/19/18 1745 06/19/18 2334 06/20/18 0514  NA 139 139 138 139  K 2.9* 2.8* 2.6* 3.1*  CL 105 104 106 110  CO2 27 25 22 23   BUN 9 9 9  7*  CREATININE 0.83 0.76 0.65 0.63  CALCIUM 12.1* 12.3* 11.1* 11.0*  MG 1.3* 1.4* 1.8  --   GLUCOSE 110* 101* 109* 119*   NUTRITION - FOCUSED PHYSICAL EXAM:   Most Recent Value  Orbital Region  Mild depletion  Upper Arm Region  Moderate depletion  Thoracic and Lumbar Region  Severe depletion  Buccal Region  Mild depletion  Temple Region  Mild depletion  Clavicle Bone Region  Moderate depletion  Clavicle and Acromion Bone Region  Moderate depletion  Scapular Bone Region  Unable to assess  Dorsal Hand  Mild depletion  Patellar Region  No depletion  Anterior Thigh Region  Mild depletion  Posterior Calf Region  No depletion  Edema (RD Assessment)  None  Hair  Reviewed  Eyes  Reviewed  Mouth  Reviewed  Skin  Reviewed  Nails  Reviewed     Diet Order:   Diet Order            Diet Heart Room service appropriate? Yes; Fluid consistency: Thin  Diet effective now             EDUCATION NEEDS:  No education needs have been identified at this time  Skin:  Skin Assessment: Reviewed RN Assessment  Last BM:  Unknown  Height:  Ht Readings from Last 1 Encounters:  06/19/18 5\' 2"  (1.575 m)   Weight:  Wt  Readings from Last 1 Encounters:  06/20/18 50.7 kg   Wt Readings from Last 10 Encounters:  06/20/18 50.7 kg  03/18/18 56.7 kg  12/20/17 54.4 kg  07/04/16 54.4 kg  06/13/16 55.7 kg  10/03/14 59 kg  05/07/14 66.1 kg  07/03/12 56.7 kg  05/27/11 75.3 kg  11/25/10 77.4 kg   Ideal Body Weight:  50 kg  BMI:  Body mass index is 20.45 kg/m.  Estimated Nutritional Needs:  Kcal:  1600-1750 (32-35 kcal/kg bw) Protein:  66-76g Pro (1.3-1.5g/kg bw) Fluid:  1.6-1.8 L fluid (73ml/kcal)  Burtis Junes RD, LDN, CNSC Clinical Nutrition Available Tues-Sat via Pager: [phone] 06/20/2018 4:39 PM

## 2018-06-20 NOTE — Progress Notes (Signed)
MEDICATION RELATED CONSULT NOTE - Follow-Up   Pharmacy Consult for Acetadote Indication: Tylenol OD  Allergies  Allergen Reactions  . Pregabalin     REACTION: "couldn't move"    Patient Measurements: Height: 5\' 2"  (157.5 cm) Weight: 123 lb 7.3 oz (56 kg) IBW/kg (Calculated) : 50.1  Vital Signs: Temp: 98.7 F (37.1 C) (01/23 0559) Temp Source: Oral (01/23 0559) BP: 143/64 (01/23 0559) Pulse Rate: 78 (01/23 0559) Intake/Output from previous day: 01/22 0701 - 01/23 0700 In: 451.4 [I.V.:351.4; IV Piggyback:100] Out: -  Intake/Output from this shift: No intake/output data recorded.  Labs: Recent Labs    06/19/18 1424 06/19/18 1745 06/19/18 2334 06/20/18 0514  WBC 12.0*  --   --  12.1*  HGB 11.0*  --   --  11.3*  HCT 35.6*  --   --  34.7*  PLT 118*  --   --  122*  CREATININE 0.83 0.76 0.65 0.63  MG 1.3* 1.4* 1.8  --   ALBUMIN 2.8* 3.3* 2.7* 2.6*  PROT 6.0* 7.1 5.9* 5.9*  AST 41 43* 38 36  ALT 17 17 16 16   ALKPHOS 94 109 82 87  BILITOT 0.4 0.8 0.8 0.3   Estimated Creatinine Clearance: 51.8 mL/min (by C-G formula based on SCr of 0.63 mg/dL).   Microbiology: No results found for this or any previous visit (from the past 720 hour(s)).  Medical History: Past Medical History:  Diagnosis Date  . Acid reflux   . Alcohol abuse, in remission   . Arthritis   . Bipolar 1 disorder ([place])   . Chronic back pain    Dr Merlene Laughter  . Chronic constipation   . COPD (chronic obstructive pulmonary disease) ([place])   . Depression   . ETOH abuse   . Hypertension   . Neurotic depression   . Personality disorder ([place])   . S/P colonoscopy 12/28/05   Dr Catalina Antigua prep, lipoma left colon  . 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 Seizures ([place])   . Sinus drainage     Medications:  See med rec  Assessment: 73 yo female brought to ED d/t fall. Per EMS and pts cargivers she had fallen yesterday and today. Patient having right hand and hip pains. Patient taking  pain pills and EMS was told the pt had rx norco filled 06/14/18, #120, and only around 90 pills were present. Poison Control was contacted by ED RN and was instructed to start Acetadote protocol d/t acutely elevated tylenol level.   Goal of Therapy:  Prevent liver damage  Plan:  Continue  Acetadote infusion at  31mL/hr, pending 1600 LFT draw  Per [place] Poison Control, ok to discontinue acetadote if 1600 LFTs are wnl F/U labs including LFTs and BMET and tylenol levels per poison control  Despina Pole, Pharm. D. Clinical Pharmacist 06/20/2018 1:37 PM

## 2018-06-20 NOTE — Progress Notes (Signed)
PROGRESS NOTE    Ashley Hall  UMP:[id number] DOB: [birth year] DOA: 06/19/2018 PCP: Lucia Gaskins, MD     Brief Narrative:  73 y.o. female with medical history significant of GERD, alcohol abuse, arthritis, bipolar 1 disorder, chronic back pain, COPD, depression, hypertension, seizures presenting to the hospital via EMS for evaluation of AMS.  Per triage note, caregiver reported that patient fell yesterday.  She had a bottle of Norco at home EMS thinks she may be taking more pills than what is prescribed.  Norco was filled 06/14/2018, #120 and only 90 pills were present in the bottle.  Patient was somnolent on arrival but became more awake and alert after receiving a dose of Narcan.  Patient AAO x3 but very slow to respond to questions.  It was difficult to obtain a thorough history from her.  Reports taking Norco 2 tablets every 4 hours normally but thinks she might have taken 4 tablets this morning.  Reports taking Tylenol but is not sure how much.  States she falls at home all the time and yesterday again lost her balance and fell.  Denies loss of consciousness.  Does report having substernal chest pain at that time and dyspnea.  Denies having any associated diaphoresis or nausea.  Reports having fevers, chills, and a cough.     Assessment & Plan: 1-Acute encephalopathy -in the setting of electrolytes impairment and non-intentional narcotics/tylenol overdose. -continue IVF's -follow electrolytes and replete them as needed -minimize avoid use of narcotics -follow mentation and clinical response.  2-PNA: with concerns for aspiration in the setting of AMS/unintentional overdose -no fever -will transition antibioitcs to augmentin BID -follow clinical response  3-Acetaminophen toxicity -tylenol level < 10 now -repeating LFT's if stable and/or trending down will discontinue acetadone -appreciate assistance by pharmacy  4-Fall at home, initial encounter -will have PT/OT  evaluating patient  And providing recommendations.  5-Chest pain -no ischemic changes on EKG or telemetry -Echo reassuring and troponin negative -no SOB. -pain most likely musculoskeletal  6-Hypokalemia, Hypomagnesemia and Hypercalcemia -all in the setting of dehydration and poor oral intake -will continue IVF's and continue repletion as needed  -Calcium trending down appropriately  7-Malnutrition of moderate degree -appreciate consultation by nutritional service -continue feeding supplements   DVT prophylaxis: Lovenox Code Status: Full Code. Family Communication: no family at bedside  Disposition Plan: follow mentation, avoid use of narcotics; control pain using flector patch. Follow electrolytes and renal function. Continue IVF's.   Consultants:   None   Procedures:   2-D echo: EF 40-08%; grade 1 diastolic HF, mild LVH; no wall motion abnormalities.   Antimicrobials:  Anti-infectives (From admission, onward)   Start     Dose/Rate Route Frequency Ordered Stop   06/19/18 1615  cefTRIAXone (ROCEPHIN) 1 g in sodium chloride 0.9 % 100 mL IVPB     1 g 200 mL/hr over 30 Minutes Intravenous Every 24 hours 06/19/18 1608 06/26/18 1614   06/19/18 1615  azithromycin (ZITHROMAX) tablet 500 mg     500 mg Oral Every 24 hours 06/19/18 1608 06/26/18 1614       Subjective: No fever. Still intermittently confused. Complaining of left costal pain.  Objective: Vitals:   06/19/18 2100 06/20/18 0559 06/20/18 1200 06/20/18 1627  BP: 123/69 (!) 143/64 (!) 155/87   Pulse: 83 78 93   Resp: 20 18 19    Temp: 98.5 F (36.9 C) 98.7 F (37.1 C) 99.2 F (37.3 C)   TempSrc: Oral Oral Oral   SpO2: 99% 100%  95%   Weight:    50.7 kg  Height:        Intake/Output Summary (Last 24 hours) at 06/20/2018 1709 Last data filed at 06/20/2018 1512 Gross per 24 hour  Intake 732.82 ml  Output -  Net 732.82 ml   Filed Weights   06/19/18 1304 06/20/18 1627  Weight: 56 kg 50.7 kg     Examination: General exam: Alert, awake, oriented x 2; still with intermittent confusion and no back to her baseline. Complaining on left costal pain, no SOB. Chronically ill in appearance and underweight.  Respiratory system: no wheezing, no crackles. Respiratory effort normal. Cardiovascular system:RRR. No murmurs, rubs, gallops. Gastrointestinal system: Abdomen is nondistended, soft and nontender. No organomegaly or masses felt. Normal bowel sounds heard. Central nervous system: Alert and oriented. No focal neurological deficits. Extremities: No C/C/E, +pedal pulses Skin: No rashes, lesions or ulcers Psychiatry: Judgement and insight appears impair with acute encephalopathic process.    Data Reviewed: I have personally reviewed following labs and imaging studies  CBC: Recent Labs  Lab 06/19/18 1424 06/20/18 0514  WBC 12.0* 12.1*  NEUTROABS 9.7*  --   HGB 11.0* 11.3*  HCT 35.6* 34.7*  MCV 102.6* 98.6  PLT 118* 793*   Basic Metabolic Panel: Recent Labs  Lab 06/19/18 1424 06/19/18 1745 06/19/18 2334 06/20/18 0514  NA 139 139 138 139  K 2.9* 2.8* 2.6* 3.1*  CL 105 104 106 110  CO2 27 25 22 23   GLUCOSE 110* 101* 109* 119*  BUN 9 9 9  7*  CREATININE 0.83 0.76 0.65 0.63  CALCIUM 12.1* 12.3* 11.1* 11.0*  MG 1.3* 1.4* 1.8  --    GFR: Estimated Creatinine Clearance: 51.8 mL/min (by C-G formula based on SCr of 0.63 mg/dL).   Liver Function Tests: Recent Labs  Lab 06/19/18 1424 06/19/18 1745 06/19/18 2334 06/20/18 0514  AST 41 43* 38 36  ALT 17 17 16 16   ALKPHOS 94 109 82 87  BILITOT 0.4 0.8 0.8 0.3  PROT 6.0* 7.1 5.9* 5.9*  ALBUMIN 2.8* 3.3* 2.7* 2.6*    Recent Labs  Lab 06/19/18 1424  AMMONIA 23   Coagulation Profile: Recent Labs  Lab 06/19/18 1424  INR 1.06   Cardiac Enzymes: Recent Labs  Lab 06/19/18 1424 06/19/18 1745 06/19/18 2334 06/20/18 0514  TROPONINI <0.03 <0.03 <0.03 <0.03   Thyroid Function Tests: Recent Labs    06/20/18 0514   TSH 0.747   Anemia Panel: Recent Labs    06/20/18 0514  VITAMINB12 248  FOLATE 12.3  FERRITIN 1,389*  TIBC 164*  IRON 99   Urine analysis:    Component Value Date/Time   COLORURINE STRAW (A) 04/01/2017 0142   APPEARANCEUR CLEAR 04/01/2017 0142   LABSPEC 1.005 04/01/2017 0142   PHURINE 6.0 04/01/2017 0142   GLUCOSEU NEGATIVE 04/01/2017 0142   HGBUR NEGATIVE 04/01/2017 0142   BILIRUBINUR NEGATIVE 04/01/2017 0142   KETONESUR NEGATIVE 04/01/2017 0142   PROTEINUR NEGATIVE 04/01/2017 0142   UROBILINOGEN 0.2 05/06/2014 1225   NITRITE NEGATIVE 04/01/2017 0142   LEUKOCYTESUR NEGATIVE 04/01/2017 0142   Radiology Studies: Dg Chest 1 View  Result Date: 06/19/2018 CLINICAL DATA:  Fall yesterday with right-sided pain, initial encounter EXAM: CHEST  1 VIEW COMPARISON:  12/20/2017 FINDINGS: Cardiac shadow is within normal limits. Aortic calcifications are seen. The lungs are well aerated bilaterally. Patchy infiltrative changes are noted within left lung. No sizable effusion is seen. No acute bony abnormality is noted. Old rib fractures on the left are  seen. IMPRESSION: Patchy left lung infiltrate. Electronically Signed   By: Inez Catalina M.D.   On: 06/19/2018 15:50   Dg Lumbar Spine 2-3 Views  Result Date: 06/19/2018 CLINICAL DATA:  Low back pain.  Fall yesterday. EXAM: LUMBAR SPINE - 2-3 VIEW COMPARISON:  CT scan December 11, 2017 FINDINGS: No fracture or traumatic malalignment. Minimal degenerative disc disease. Lower lumbar facet degenerative changes are noted. Calcified atherosclerosis of the abdominal aorta. IMPRESSION: Degenerative changes.  No acute fracture or malalignment noted. Electronically Signed   By: Dorise Bullion III M.D   On: 06/19/2018 18:24   Ct Head Wo Contrast  Result Date: 06/19/2018 CLINICAL DATA:  Fall, subacute head trauma, headache and neck pain EXAM: CT HEAD WITHOUT CONTRAST CT CERVICAL SPINE WITHOUT CONTRAST TECHNIQUE: Multidetector CT imaging of the head and  cervical spine was performed following the standard protocol without intravenous contrast. Multiplanar CT image reconstructions of the cervical spine were also generated. COMPARISON:  03/18/2018, 04/01/2017 FINDINGS: CT HEAD FINDINGS Brain: Stable atrophy pattern and minor white matter microvascular changes. No acute intracranial hemorrhage, definite new infarction, midline shift, herniation, hydrocephalus, or extra-axial fluid collection. No focal mass effect or edema. Cisterns are patent. Cerebellar atrophy as well. Vascular: Intracranial atherosclerosis. No hyperdense vessel. Skull: Normal. Negative for fracture or focal lesion. Sinuses/Orbits: No acute finding. Other: None. CT CERVICAL SPINE FINDINGS Alignment: No malalignment. Facets are aligned. No subluxation or dislocation. Previous fusion at C6-7 Skull base and vertebrae: No acute fracture. No primary bone lesion or focal pathologic process. Soft tissues and spinal canal: No prevertebral fluid or swelling. No visible canal hematoma. Disc levels: Multilevel cervical degenerative spondylosis throughout the entire cervical spine and posterior facet arthropathy. these changes are most severe at C5-6 and C7-T1 with marked disc space narrowing, sclerosis osteophyte template formation. Degenerative changes of the C1-2 articulation as well. Upper chest: Left upper lobe septal thickening mild patchy airspace opacity concerning for component asymmetric edema. Difficult to exclude pneumonia. Coronary atherosclerosis noted. Other: none IMPRESSION: Stable noncontrast head CT without interval change or acute finding. Stable cervical spine degenerative spondylosis as detailed without acute osseous finding or malalignment Left upper lobe interlobular septal thickening and mild patchy airspace process concerning for asymmetric edema and/or pneumonia. Electronically Signed   By: Jerilynn Mages.  Shick M.D.   On: 06/19/2018 16:20   Ct Cervical Spine Wo Contrast  Result Date:  06/19/2018 CLINICAL DATA:  Fall, subacute head trauma, headache and neck pain EXAM: CT HEAD WITHOUT CONTRAST CT CERVICAL SPINE WITHOUT CONTRAST TECHNIQUE: Multidetector CT imaging of the head and cervical spine was performed following the standard protocol without intravenous contrast. Multiplanar CT image reconstructions of the cervical spine were also generated. COMPARISON:  03/18/2018, 04/01/2017 FINDINGS: CT HEAD FINDINGS Brain: Stable atrophy pattern and minor white matter microvascular changes. No acute intracranial hemorrhage, definite new infarction, midline shift, herniation, hydrocephalus, or extra-axial fluid collection. No focal mass effect or edema. Cisterns are patent. Cerebellar atrophy as well. Vascular: Intracranial atherosclerosis. No hyperdense vessel. Skull: Normal. Negative for fracture or focal lesion. Sinuses/Orbits: No acute finding. Other: None. CT CERVICAL SPINE FINDINGS Alignment: No malalignment. Facets are aligned. No subluxation or dislocation. Previous fusion at C6-7 Skull base and vertebrae: No acute fracture. No primary bone lesion or focal pathologic process. Soft tissues and spinal canal: No prevertebral fluid or swelling. No visible canal hematoma. Disc levels: Multilevel cervical degenerative spondylosis throughout the entire cervical spine and posterior facet arthropathy. these changes are most severe at C5-6 and C7-T1 with marked disc  space narrowing, sclerosis osteophyte template formation. Degenerative changes of the C1-2 articulation as well. Upper chest: Left upper lobe septal thickening mild patchy airspace opacity concerning for component asymmetric edema. Difficult to exclude pneumonia. Coronary atherosclerosis noted. Other: none IMPRESSION: Stable noncontrast head CT without interval change or acute finding. Stable cervical spine degenerative spondylosis as detailed without acute osseous finding or malalignment Left upper lobe interlobular septal thickening and mild  patchy airspace process concerning for asymmetric edema and/or pneumonia. Electronically Signed   By: Jerilynn Mages.  Shick M.D.   On: 06/19/2018 16:20   Dg Hand Complete Right  Result Date: 06/19/2018 CLINICAL DATA:  Right hand pain since falling yesterday. EXAM: RIGHT HAND - COMPLETE 3+ VIEW COMPARISON:  Wrist radiographs 05/24/2018. FINDINGS: Patient unable to remove the ring from the right index finger. The bones are demineralized. No evidence of acute fracture or dislocation. There are minimal interphalangeal degenerative changes. No focal soft tissue swelling or foreign body identified. IMPRESSION: No evidence of acute right hand injury. Electronically Signed   By: Richardean Sale M.D.   On: 06/19/2018 15:50   Dg Hip Unilat With Pelvis 2-3 Views Right  Result Date: 06/19/2018 CLINICAL DATA:  Fall yesterday.  Right hip pain. EXAM: DG HIP (WITH OR WITHOUT PELVIS) 2-3V RIGHT COMPARISON:  Radiographs 12/20/2017. FINDINGS: The bones are diffusely demineralized. No evidence of acute fracture, dislocation or femoral head avascular necrosis. There are stable minimal degenerative changes involving the hips and sacroiliac joints bilaterally. IMPRESSION: Stable examination without evidence of acute osseous injury. If the patient has persistent hip pain or inability to bear weight, follow up imaging may be warranted as acute hip fractures can be radiographically occult in the elderly. Electronically Signed   By: Richardean Sale M.D.   On: 06/19/2018 15:48    Scheduled Meds: . azithromycin  500 mg Oral Q24H  . diclofenac  1 patch Transdermal BID  . enoxaparin (LOVENOX) injection  40 mg Subcutaneous Q24H  . feeding supplement (ENSURE ENLIVE)  237 mL Oral BID BM  . ferrous gluconate  324 mg Oral Q breakfast  . folic acid  1 mg Oral Daily  . levothyroxine  50 mcg Oral Daily  . multivitamin with minerals  1 tablet Oral Daily  . omega-3 acid ethyl esters  2 g Oral Daily  . pantoprazole  40 mg Oral BID AC  . thiamine   100 mg Oral Daily   Or  . thiamine  100 mg Intravenous Daily  . [START ON 06/21/2018] vitamin A  10,000 Units Oral Daily  . vitamin C  1,000 mg Oral Daily  . vitamin E  400 Units Oral Daily   Continuous Infusions: . acetylcysteine 15 mg/kg/hr (06/19/18 1745)  . cefTRIAXone (ROCEPHIN)  IV 1 g (06/20/18 1623)     LOS: 0 days    Time spent: 30 minutes   Barton Dubois, MD [facility] Pager [phone]  If 7PM-7AM, please contact night-coverage [email or web address] Password [facility] 06/20/2018, 5:09 PM

## 2018-06-20 NOTE — Progress Notes (Signed)
RN paged C. Bodenheimer, NP to make him aware patient's CIWA score is 13 and patient has no IV access.  Patient continues to pull at lines and telemetry box.  RN gave patient 1 mg Ativan PO and will continue to monitor patient and make NP aware of any changes.  RN will obtain IV access when patient is calm.  P.J. Linus Mako, RN

## 2018-06-20 NOTE — Progress Notes (Signed)
Ativan 2 mg IM ordered for patient's CIWA score of 13, score rechecked and patient scores 15 on CIWA scale.  Ativan 2 mg IM given per order in left upper outer gluteal quadrant.  BP elevated, but patient continues to move arm during taking of blood pressure.  RN will recheck BP when patient has calmed down.  RN will continue to monitor patient and make provider aware of any changes.  P.J. Linus Mako, RN

## 2018-06-20 NOTE — Plan of Care (Signed)
  Problem: Acute Rehab PT Goals(only PT should resolve) Goal: Pt Will Go Sit To Supine/Side Outcome: Progressing Flowsheets (Taken 06/20/2018 1250) Pt will go Sit to Supine/Side: with min guard assist Goal: Patient Will Transfer Sit To/From Stand Outcome: Progressing Flowsheets (Taken 06/20/2018 1250) Patient will transfer sit to/from stand: with minimal assist Goal: Pt Will Transfer Bed To Chair/Chair To Bed Outcome: Progressing Flowsheets (Taken 06/20/2018 1250) Pt will Transfer Bed to Chair/Chair to Bed: with min assist Goal: Pt Will Ambulate Outcome: Progressing Flowsheets (Taken 06/20/2018 1250) Pt will Ambulate: 15 feet; with moderate assist; with rolling walker   12:50 PM, 06/20/18 Talbot Grumbling, DPT Physical Therapist with [facility] [phone] office

## 2018-06-20 NOTE — Progress Notes (Signed)
*  PRELIMINARY RESULTS* Echocardiogram 2D Echocardiogram has been performed.  Samuel Germany 06/20/2018, 3:40 PM

## 2018-06-21 DIAGNOSIS — K219 Gastro-esophageal reflux disease without esophagitis: Secondary | ICD-10-CM | POA: Diagnosis present

## 2018-06-21 DIAGNOSIS — E440 Moderate protein-calorie malnutrition: Secondary | ICD-10-CM | POA: Diagnosis present

## 2018-06-21 DIAGNOSIS — R0789 Other chest pain: Secondary | ICD-10-CM | POA: Diagnosis not present

## 2018-06-21 DIAGNOSIS — T402X1D Poisoning by other opioids, accidental (unintentional), subsequent encounter: Secondary | ICD-10-CM

## 2018-06-21 DIAGNOSIS — D649 Anemia, unspecified: Secondary | ICD-10-CM | POA: Diagnosis present

## 2018-06-21 DIAGNOSIS — T402X1A Poisoning by other opioids, accidental (unintentional), initial encounter: Secondary | ICD-10-CM | POA: Diagnosis present

## 2018-06-21 DIAGNOSIS — J449 Chronic obstructive pulmonary disease, unspecified: Secondary | ICD-10-CM | POA: Diagnosis present

## 2018-06-21 DIAGNOSIS — M25551 Pain in right hip: Secondary | ICD-10-CM | POA: Diagnosis present

## 2018-06-21 DIAGNOSIS — K5909 Other constipation: Secondary | ICD-10-CM | POA: Diagnosis present

## 2018-06-21 DIAGNOSIS — M545 Low back pain: Secondary | ICD-10-CM | POA: Diagnosis present

## 2018-06-21 DIAGNOSIS — E876 Hypokalemia: Secondary | ICD-10-CM | POA: Diagnosis present

## 2018-06-21 DIAGNOSIS — Z9114 Patient's other noncompliance with medication regimen: Secondary | ICD-10-CM

## 2018-06-21 DIAGNOSIS — E039 Hypothyroidism, unspecified: Secondary | ICD-10-CM | POA: Diagnosis present

## 2018-06-21 DIAGNOSIS — I10 Essential (primary) hypertension: Secondary | ICD-10-CM | POA: Diagnosis present

## 2018-06-21 DIAGNOSIS — G92 Toxic encephalopathy: Secondary | ICD-10-CM | POA: Diagnosis present

## 2018-06-21 DIAGNOSIS — F1011 Alcohol abuse, in remission: Secondary | ICD-10-CM | POA: Diagnosis present

## 2018-06-21 DIAGNOSIS — F609 Personality disorder, unspecified: Secondary | ICD-10-CM | POA: Diagnosis present

## 2018-06-21 DIAGNOSIS — T391X1D Poisoning by 4-Aminophenol derivatives, accidental (unintentional), subsequent encounter: Secondary | ICD-10-CM | POA: Diagnosis not present

## 2018-06-21 DIAGNOSIS — F17210 Nicotine dependence, cigarettes, uncomplicated: Secondary | ICD-10-CM | POA: Diagnosis present

## 2018-06-21 DIAGNOSIS — E860 Dehydration: Secondary | ICD-10-CM | POA: Diagnosis present

## 2018-06-21 DIAGNOSIS — T391X1A Poisoning by 4-Aminophenol derivatives, accidental (unintentional), initial encounter: Secondary | ICD-10-CM | POA: Diagnosis not present

## 2018-06-21 DIAGNOSIS — W010XXA Fall on same level from slipping, tripping and stumbling without subsequent striking against object, initial encounter: Secondary | ICD-10-CM | POA: Diagnosis present

## 2018-06-21 DIAGNOSIS — G9340 Encephalopathy, unspecified: Secondary | ICD-10-CM | POA: Diagnosis not present

## 2018-06-21 DIAGNOSIS — F341 Dysthymic disorder: Secondary | ICD-10-CM | POA: Diagnosis present

## 2018-06-21 DIAGNOSIS — J159 Unspecified bacterial pneumonia: Secondary | ICD-10-CM | POA: Diagnosis present

## 2018-06-21 DIAGNOSIS — G8929 Other chronic pain: Secondary | ICD-10-CM | POA: Diagnosis present

## 2018-06-21 DIAGNOSIS — J189 Pneumonia, unspecified organism: Secondary | ICD-10-CM | POA: Diagnosis not present

## 2018-06-21 DIAGNOSIS — F319 Bipolar disorder, unspecified: Secondary | ICD-10-CM | POA: Diagnosis present

## 2018-06-21 DIAGNOSIS — R000 Tachycardia, unspecified: Secondary | ICD-10-CM | POA: Diagnosis present

## 2018-06-21 LAB — BASIC METABOLIC PANEL
ANION GAP: 9 (ref 5–15)
BUN: 6 mg/dL — ABNORMAL LOW (ref 8–23)
CO2: 24 mmol/L (ref 22–32)
Calcium: 11.4 mg/dL — ABNORMAL HIGH (ref 8.9–10.3)
Chloride: 106 mmol/L (ref 98–111)
Creatinine, Ser: 0.72 mg/dL (ref 0.44–1.00)
GFR calc Af Amer: >60 mL/min (ref >60)
GFR calc non Af Amer: >60 mL/min (ref >60)
Glucose, Bld: 155 mg/dL — ABNORMAL HIGH (ref 70–99)
Potassium: 3 mmol/L — ABNORMAL LOW (ref 3.5–5.1)
Sodium: 139 mmol/L (ref 135–145)

## 2018-06-21 MED ORDER — METOPROLOL TARTRATE 25 MG PO TABS
25.0000 mg | ORAL_TABLET | Freq: Two times a day (BID) | ORAL | Status: DC
  Administered 2018-06-21 – 2018-06-27 (×13): 25 mg via ORAL
  Filled 2018-06-21 (×13): qty 1

## 2018-06-21 MED ORDER — HALOPERIDOL LACTATE 5 MG/ML IJ SOLN
2.0000 mg | Freq: Four times a day (QID) | INTRAMUSCULAR | Status: DC | PRN
  Administered 2018-06-21: 2 mg via INTRAMUSCULAR
  Filled 2018-06-21: qty 1

## 2018-06-21 MED ORDER — SODIUM CHLORIDE 0.9 % IV SOLN
INTRAVENOUS | Status: DC
  Administered 2018-06-21 – 2018-06-24 (×2): via INTRAVENOUS

## 2018-06-21 MED ORDER — LORAZEPAM 2 MG/ML IJ SOLN
2.0000 mg | Freq: Once | INTRAMUSCULAR | Status: AC
  Administered 2018-06-21: 2 mg via INTRAMUSCULAR
  Filled 2018-06-21: qty 1

## 2018-06-21 MED ORDER — GABAPENTIN 100 MG PO CAPS
200.0000 mg | ORAL_CAPSULE | Freq: Three times a day (TID) | ORAL | Status: DC
  Administered 2018-06-21 – 2018-06-26 (×18): 200 mg via ORAL
  Filled 2018-06-21 (×18): qty 2

## 2018-06-21 MED ORDER — ZOLEDRONIC ACID 4 MG/5ML IV CONC
4.0000 mg | Freq: Once | INTRAVENOUS | Status: AC
  Administered 2018-06-21: 4 mg via INTRAVENOUS
  Filled 2018-06-21: qty 5

## 2018-06-21 MED ORDER — POTASSIUM CHLORIDE CRYS ER 20 MEQ PO TBCR
40.0000 meq | EXTENDED_RELEASE_TABLET | ORAL | Status: AC
  Administered 2018-06-21 (×2): 40 meq via ORAL
  Filled 2018-06-21 (×2): qty 2

## 2018-06-21 MED ORDER — FENTANYL CITRATE (PF) 100 MCG/2ML IJ SOLN
25.0000 ug | INTRAMUSCULAR | Status: DC | PRN
  Administered 2018-06-21 – 2018-06-23 (×5): 25 ug via INTRAVENOUS
  Filled 2018-06-21 (×5): qty 2

## 2018-06-21 MED ORDER — HALOPERIDOL LACTATE 5 MG/ML IJ SOLN
2.0000 mg | Freq: Three times a day (TID) | INTRAMUSCULAR | Status: DC | PRN
  Administered 2018-06-22 – 2018-06-24 (×4): 2 mg via INTRAMUSCULAR
  Filled 2018-06-21 (×4): qty 1

## 2018-06-21 MED ORDER — PAROXETINE HCL 20 MG PO TABS
20.0000 mg | ORAL_TABLET | Freq: Every day | ORAL | Status: DC
  Administered 2018-06-21 – 2018-06-26 (×6): 20 mg via ORAL
  Filled 2018-06-21 (×6): qty 1

## 2018-06-21 MED ORDER — SODIUM CHLORIDE 0.9 % IV SOLN
30.0000 mg | Freq: Once | INTRAVENOUS | Status: DC
  Filled 2018-06-21: qty 10

## 2018-06-21 NOTE — Clinical Social Work Placement (Signed)
   CLINICAL SOCIAL WORK PLACEMENT  NOTE  Date:  06/21/2018  Patient Details  Name: Ashley Hall MRN: [id number] Date of Birth: 1945/08/27  Clinical Social Work is seeking post-discharge placement for this patient at the [place] level of care (*CSW will initial, date and re-position this form in  chart as items are completed):  Yes   Patient/family provided with [facility] Work Department's list of facilities offering this level of care within the geographic area requested by the patient (or if unable, by the patient's family).  Yes   Patient/family informed of their freedom to choose among providers that offer the needed level of care, that participate in Medicare, Medicaid or managed care program needed by the patient, have an available bed and are willing to accept the patient.  Yes   Patient/family informed of [facility]'s ownership interest in [facility] and [facility], as well as of the fact that they are under no obligation to receive care at these facilities.  PASRR submitted to EDS on 06/21/18     PASRR number received on       Existing PASRR number confirmed on       FL2 transmitted to all facilities in geographic area requested by pt/family on 06/21/18     FL2 transmitted to all facilities within larger geographic area on       Patient informed that his/her managed care company has contracts with or will negotiate with certain facilities, including the following:            Patient/family informed of bed offers received.  Patient chooses bed at       Physician recommends and patient chooses bed at      Patient to be transferred to   on  .  Patient to be transferred to facility by       Patient family notified on   of transfer.  Name of family member notified:        PHYSICIAN       Additional Comment:    _______________________________________________ Ihor Gully, LCSW 06/21/2018, 2:55 PM

## 2018-06-21 NOTE — Progress Notes (Signed)
PROGRESS NOTE    Ashley Hall  RWE:[id number] DOB: [birth year] DOA: 06/19/2018 PCP: Lucia Gaskins, MD     Brief Narrative:  73 y.o. female with medical history significant of GERD, alcohol abuse, arthritis, bipolar 1 disorder, chronic back pain, COPD, depression, hypertension, seizures presenting to the hospital via EMS for evaluation of AMS.  Per triage note, caregiver reported that patient fell yesterday.  She had a bottle of Norco at home EMS thinks she may be taking more pills than what is prescribed.  Norco was filled 06/14/2018, #120 and only 90 pills were present in the bottle.  Patient was somnolent on arrival but became more awake and alert after receiving a dose of Narcan.  Patient AAO x3 but very slow to respond to questions.  It was difficult to obtain a thorough history from her.  Reports taking Norco 2 tablets every 4 hours normally but thinks she might have taken 4 tablets this morning.  Reports taking Tylenol but is not sure how much.  States she falls at home all the time and yesterday again lost her balance and fell.  Denies loss of consciousness.  Does report having substernal chest pain at that time and dyspnea.  Denies having any associated diaphoresis or nausea.  Reports having fevers, chills, and a cough.     Assessment & Plan: 1-Acute encephalopathy -in the setting of electrolytes impairment and non-intentional narcotics/tylenol overdose. -Now with new component of hospital associated delirium and benzodiazepines side effects. -continue IVF's -follow electrolytes and replete them/decrease their levels as needed -minimize sedative use of benzodiazepines and narcotics -follow mentation and clinical response. -If needed for severe agitation will use Haldol.  2-PNA: with concerns for aspiration in the setting of AMS/unintentional overdose -no fever -Continue Augmentin -follow clinical response -Continue oxygen supplementation as needed.  3-Acetaminophen  toxicity -tylenol level < 10 now -repeating LFT's if stable and/or trending down will discontinue acetadone -appreciate assistance by pharmacy  4-Fall at home, initial encounter -will have PT/OT evaluating patient and providing recommendations.  5-Chest pain -no ischemic changes on EKG or telemetry -Echo reassuring and troponin negative -no SOB. -pain most likely musculoskeletal  6-Hypokalemia, Hypomagnesemia and Hypercalcemia -all in the setting of dehydration and poor oral intake -will continue IVF's and continue repletion as needed  -Calcium still elevated -will give bisphosphonate X1 -follow electrolytes trend  7-Malnutrition of moderate degree -appreciate consultation by nutritional service -continue feeding supplements  8-HTN/tachycardia -Most likely associated with rebound tachycardia after stopping beta-blocker -Will resume metoprolol.   DVT prophylaxis: Lovenox Code Status: Full Code. Family Communication: no family at bedside  Disposition Plan: Continue to follow mentation, avoid use of heavy dosages of narcotics; control pain using flector patch.  Avoid the use of benzodiazepines.  Follow electrolytes and renal function. Continue IVF's.   Consultants:   None   Procedures:   2-D echo: EF 61-95%; grade 1 diastolic HF, mild LVH; no wall motion abnormalities.   Antimicrobials:  Anti-infectives (From admission, onward)   Start     Dose/Rate Route Frequency Ordered Stop   06/20/18 2200  amoxicillin-clavulanate (AUGMENTIN) 875-125 MG per tablet 1 tablet     1 tablet Oral Every 12 hours 06/20/18 1710     06/19/18 1615  cefTRIAXone (ROCEPHIN) 1 g in sodium chloride 0.9 % 100 mL IVPB  Status:  Discontinued     1 g 200 mL/hr over 30 Minutes Intravenous Every 24 hours 06/19/18 1608 06/20/18 1710   06/19/18 1615  azithromycin (ZITHROMAX) tablet 500 mg  Status:  Discontinued     500 mg Oral Every 24 hours 06/19/18 1608 06/20/18 1710      Subjective: Afebrile,  patient is confused and delirious on examination.  Having difficulty following commands and to a certain degree somnolent/obtunded from earlier medications given.  Objective: Vitals:   06/20/18 2344 06/21/18 0059 06/21/18 0635 06/21/18 1300  BP: (!) 182/148 (!) 140/99 (!) 154/65 (!) 116/97  Pulse: 90 (!) 141 (!) 123 99  Resp: 18  18 18   Temp: 99.1 F (37.3 C)  99.4 F (37.4 C) 98.7 F (37.1 C)  TempSrc: Oral  Oral Oral  SpO2: 92%  97% 95%  Weight:      Height:       No intake or output data in the 24 hours ending 06/21/18 1608 Filed Weights   06/19/18 1304 06/20/18 1627  Weight: 56 kg 50.7 kg    Examination: General exam: Alert, awake, oriented x 1; patient with episode of agitation, combativeness and delirium.  Mentation still no back to baseline. Respiratory system: Clear to auscultation. Respiratory effort normal. Cardiovascular system:RRR. No rubs or gallops.  No JVD appreciated on exam. Gastrointestinal system: Abdomen is nondistended, soft and nontender. No organomegaly or masses felt. Normal bowel sounds heard. Central nervous system: Alert and oriented. No focal neurological deficits. Extremities: No C/C/E, +pedal pulses Skin: No rashes, lesions or ulcers Psychiatry: Judgement and insight appear impaired.  Data Reviewed: I have personally reviewed following labs and imaging studies  CBC: Recent Labs  Lab 06/19/18 1424 06/20/18 0514  WBC 12.0* 12.1*  NEUTROABS 9.7*  --   HGB 11.0* 11.3*  HCT 35.6* 34.7*  MCV 102.6* 98.6  PLT 118* 409*   Basic Metabolic Panel: Recent Labs  Lab 06/19/18 1424 06/19/18 1745 06/19/18 2334 06/20/18 0514 06/21/18 1029  NA 139 139 138 139 139  K 2.9* 2.8* 2.6* 3.1* 3.0*  CL 105 104 106 110 106  CO2 27 25 22 23 24   GLUCOSE 110* 101* 109* 119* 155*  BUN 9 9 9  7* 6*  CREATININE 0.83 0.76 0.65 0.63 0.72  CALCIUM 12.1* 12.3* 11.1* 11.0* 11.4*  MG 1.3* 1.4* 1.8  --   --    GFR: Estimated Creatinine Clearance: 51.8 mL/min (by  C-G formula based on SCr of 0.72 mg/dL).   Liver Function Tests: Recent Labs  Lab 06/19/18 1424 06/19/18 1745 06/19/18 2334 06/20/18 0514 06/20/18 1614  AST 41 43* 38 36 36  ALT 17 17 16 16 15   ALKPHOS 94 109 82 87 83  BILITOT 0.4 0.8 0.8 0.3 0.9  PROT 6.0* 7.1 5.9* 5.9* 5.6*  ALBUMIN 2.8* 3.3* 2.7* 2.6* 2.4*    Recent Labs  Lab 06/19/18 1424  AMMONIA 23   Coagulation Profile: Recent Labs  Lab 06/19/18 1424  INR 1.06   Cardiac Enzymes: Recent Labs  Lab 06/19/18 1424 06/19/18 1745 06/19/18 2334 06/20/18 0514  TROPONINI <0.03 <0.03 <0.03 <0.03   Thyroid Function Tests: Recent Labs    06/20/18 0514  TSH 0.747   Anemia Panel: Recent Labs    06/20/18 0514  VITAMINB12 248  FOLATE 12.3  FERRITIN 1,389*  TIBC 164*  IRON 99   Urine analysis:    Component Value Date/Time   COLORURINE STRAW (A) 04/01/2017 0142   APPEARANCEUR CLEAR 04/01/2017 0142   LABSPEC 1.005 04/01/2017 0142   PHURINE 6.0 04/01/2017 0142   GLUCOSEU NEGATIVE 04/01/2017 0142   HGBUR NEGATIVE 04/01/2017 0142   BILIRUBINUR NEGATIVE 04/01/2017 0142   KETONESUR NEGATIVE 04/01/2017 0142  PROTEINUR NEGATIVE 04/01/2017 0142   UROBILINOGEN 0.2 05/06/2014 1225   NITRITE NEGATIVE 04/01/2017 0142   LEUKOCYTESUR NEGATIVE 04/01/2017 0142   Radiology Studies: Dg Lumbar Spine 2-3 Views  Result Date: 06/19/2018 CLINICAL DATA:  Low back pain.  Fall yesterday. EXAM: LUMBAR SPINE - 2-3 VIEW COMPARISON:  CT scan December 11, 2017 FINDINGS: No fracture or traumatic malalignment. Minimal degenerative disc disease. Lower lumbar facet degenerative changes are noted. Calcified atherosclerosis of the abdominal aorta. IMPRESSION: Degenerative changes.  No acute fracture or malalignment noted. Electronically Signed   By: Dorise Bullion III M.D   On: 06/19/2018 18:24    Scheduled Meds: . amoxicillin-clavulanate  1 tablet Oral Q12H  . diclofenac  1 patch Transdermal BID  . enoxaparin (LOVENOX) injection  40 mg  Subcutaneous Q24H  . feeding supplement (ENSURE ENLIVE)  237 mL Oral BID BM  . ferrous gluconate  324 mg Oral Q breakfast  . folic acid  1 mg Oral Daily  . gabapentin  200 mg Oral TID  . levothyroxine  50 mcg Oral Daily  . metoprolol tartrate  25 mg Oral BID  . multivitamin with minerals  1 tablet Oral Daily  . omega-3 acid ethyl esters  2 g Oral Daily  . pantoprazole  40 mg Oral BID AC  . PARoxetine  20 mg Oral Daily  . potassium chloride  40 mEq Oral Q4H  . thiamine  100 mg Oral Daily   Or  . thiamine  100 mg Intravenous Daily  . vitamin A  10,000 Units Oral Daily  . vitamin C  1,000 mg Oral Daily  . vitamin E  400 Units Oral Daily   Continuous Infusions: . sodium chloride    . pamidronate       LOS: 0 days    Time spent: 30 minutes   Barton Dubois, MD [facility] Pager [phone]  06/21/2018, 4:08 PM

## 2018-06-21 NOTE — Clinical Social Work Note (Signed)
Clinical Social Work Assessment  Patient Details  Name: Ashley Hall MRN: [id number] Date of Birth: 04/26/1946  Date of referral:  06/21/18               Reason for consult:  Facility Placement                Permission sought to share information with:    Permission granted to share information::     Name::        Agency::     Relationship::  niece, Ashley Hall, listed on chart  Contact Information:     Housing/Transportation Living arrangements for the past 2 months:  Single Family Home Source of Information:  Other (Comment Required)(niece Ashley Hall) Patient Interpreter Needed:  None Criminal Activity/Legal Involvement Pertinent to Current Situation/Hospitalization:  No - Comment as needed Significant Relationships:  Other Family Members Lives with:  Self Do you feel safe going back to the place where you live?  Yes Need for family participation in patient care:  Yes (Comment)  Care giving concerns:  No concerns identified at baseline.    Social Worker assessment / plan:   LCSW spoke with patient's niece via phone regarding PT recommendation. Niece, Ashley Hall, indicated that patient may want to go to SNF at discharge once she is no longer confused.  She provided three choices for potential placements.   Case management is also working on the plan below:  Admitted with encephalopathy secondary to medication misuse. Pt confused on assessment 06/20/2018. CM discussed DC planning via phone with niece who is POA. Pt from home, lives alone, has aid 7 days a week 5-7 hours a day. Pt uses cane or walker pta with ambulation. Per niece pt falls frequently and over medicates as a result to ease pain. Per niece pt is usually DC'd home with HH. PT has recommended SNF. Anticipate pt will improve with improvement in her mentation. CM will cont to follow for DC needs.   Employment status:  Retired Nurse, adult PT Recommendations:  [place] / Referral to community resources:  [place]  Patient/Family's Response to care:  Niece wants patient to have less confusion before making a decision on SNF vs HHPT.   Patient/Family's Understanding of and Emotional Response to Diagnosis, Current Treatment, and Prognosis:  Niece understands patient's diagnosis, treatment and prognosis.   Emotional Assessment Appearance:  Appears stated age Attitude/Demeanor/Rapport:    Affect (typically observed):  Unable to Assess Orientation:  Oriented to Self Alcohol / Substance use:  Not Applicable Psych involvement (Current and /or in the community):     Discharge Needs  Concerns to be addressed:  Discharge Planning Concerns Readmission within the last 30 days:  No Current discharge risk:  None Barriers to Discharge:  Insurance Authorization   Ihor Gully, LCSW 06/21/2018, 3:08 PM

## 2018-06-21 NOTE — Progress Notes (Signed)
Patient continues to be agitated and confused.  RN notified Dr. Myna Hidalgo that [place] appears to have not helped patient.  RN also made MD aware of patients elevated HR of 141.  Order for Haldol IM received.  P.J. Linus Mako, RN

## 2018-06-21 NOTE — Progress Notes (Signed)
RN paged Dr. Myna Hidalgo to make him aware patient's HR is 123 and CIWA score is 15 and this is the second consecutive time it has been high, awaiting response.  P.J. Linus Mako, RN

## 2018-06-21 NOTE — Care Management (Signed)
Admitted with encephalopathy secondary to medication misuse. Pt confused on assessment 06/20/2018. CM discussed DC planning via phone with niece who is POA. Pt from home, lives alone, has aid 7 days a week 5-7 hours a day. Pt uses cane or walker pta with ambulation. Per niece pt falls frequently and over medicates as a result to ease pain. Per niece pt is usually DC'd home with HH. PT has recommended SNF. Anticipate pt will improve with improvement in her mentation. CM will cont to follow for DC needs.

## 2018-06-21 NOTE — NC FL2 (Signed)
MEDICAID FL2 LEVEL OF CARE SCREENING TOOL     IDENTIFICATION  Patient Name: Ashley Hall Birthdate: 09/05/1945 Sex: female Admission Date (Current Location): 06/19/2018  [facility] and [place] Number:  [employer] and Address:  [facility] [address], [place]      Provider Number: [id number]  Attending Physician Name and Address:  Barton Dubois, MD  Relative Name and Phone Number:       Current Level of Care: Hospital Recommended Level of Care: [place] Prior Approval Number:    Date Approved/Denied:   PASRR Number:    Discharge Plan: SNF    Current Diagnoses: Patient Active Problem List   Diagnosis Date Noted  . Malnutrition of moderate degree 06/20/2018  . Acute encephalopathy 06/19/2018  . Opioid overdose ([place]) 06/19/2018  . Acetaminophen toxicity 06/19/2018  . Fall at home, initial encounter 06/19/2018  . Chest pain 06/19/2018  . Hypokalemia 06/19/2018  . Hypomagnesemia 06/19/2018  . Hypercalcemia 06/19/2018  . Gastroesophageal reflux disease without esophagitis 06/13/2016  . Demand ischemia ([place]) 05/07/2014  . Altered mental state 05/06/2014  . Tobacco abuse 05/06/2014  . ETOH abuse 05/06/2014  . Drug overdose 05/06/2014  . Elevated troponin 05/06/2014  . CAP (community acquired pneumonia) 05/06/2014  . Lactic acidosis 05/06/2014  . Leukocytosis 05/06/2014  . Severe depression ([place]) 05/06/2014  . Insomnia 05/06/2014  . Hypothyroidism 05/06/2014  . GERD (gastroesophageal reflux disease) 05/06/2014  . Rhabdomyolysis 05/06/2014  . Physical deconditioning 05/06/2014  . Altered mental status   . Aspiration pneumonia ([place])   . Abnormal CT scan, pancreas or bile duct 11/25/2010  . Abdominal pain 11/25/2010  . ABNORMAL ELECTROCARDIOGRAM 06/16/2009  . B12 DEFICIENCY 06/15/2009  . NONDEPENDENT TOBACCO USE DISORDER 06/15/2009  . Essential hypertension 06/15/2009  . COPD (chronic  obstructive pulmonary disease) ([place]) 06/15/2009  . SYNCOPE AND COLLAPSE 06/15/2009  . OTHER MALAISE AND FATIGUE 06/15/2009  . BACK PAIN 03/25/2007    Orientation RESPIRATION BLADDER Height & Weight     Self  O2(4L) Incontinent Weight: 111 lb 12.8 oz (50.7 kg) Height:  5\' 2"  (157.5 cm)  BEHAVIORAL SYMPTOMS/MOOD NEUROLOGICAL BOWEL NUTRITION STATUS      Incontinent Diet(heart healthy)  AMBULATORY STATUS COMMUNICATION OF NEEDS Skin   Limited Assist Verbally Normal                       Personal Care Assistance Level of Assistance  Bathing, Feeding, Dressing Bathing Assistance: Limited assistance Feeding assistance: Independent Dressing Assistance: Limited assistance     Functional Limitations Info  Sight, Hearing, Speech Sight Info: Adequate Hearing Info: Adequate Speech Info: Adequate    SPECIAL CARE FACTORS FREQUENCY  PT (By licensed PT)                    Contractures Contractures Info: Not present    Additional Factors Info  Code Status, Allergies, Psychotropic Code Status Info: Full Code Allergies Info: Pregabalin Psychotropic Info: Paxil, Desyrel, Xanax, Topamax         Current Medications (06/21/2018):  This is the current hospital active medication list Current Facility-Administered Medications  Medication Dose Route Frequency Provider Last Rate Last Dose  . amoxicillin-clavulanate (AUGMENTIN) 875-125 MG per tablet 1 tablet  1 tablet Oral Q12H Barton Dubois, MD   1 tablet at 06/21/18 0947  . diclofenac (FLECTOR) 1.3 % 1 patch  1 patch Transdermal BID Barton Dubois, MD   1 patch at 06/21/18 1031  .  enoxaparin (LOVENOX) injection 40 mg  40 mg Subcutaneous Q24H Shela Leff, MD   40 mg at 06/20/18 1757  . feeding supplement (ENSURE ENLIVE) (ENSURE ENLIVE) liquid 237 mL  237 mL Oral BID BM Barton Dubois, MD   237 mL at 06/21/18 1029  . ferrous gluconate (FERGON) tablet 324 mg  324 mg Oral Q breakfast Barton Dubois, MD   324 mg at 06/21/18 1031   . folic acid (FOLVITE) tablet 1 mg  1 mg Oral Daily Shela Leff, MD   1 mg at 06/21/18 0947  . gabapentin (NEURONTIN) capsule 200 mg  200 mg Oral TID Barton Dubois, MD   200 mg at 06/21/18 0948  . haloperidol lactate (HALDOL) injection 2 mg  2 mg Intramuscular Q6H PRN Vianne Bulls, MD   2 mg at 06/21/18 0219  . ipratropium-albuterol (DUONEB) 0.5-2.5 (3) MG/3ML nebulizer solution 3 mL  3 mL Nebulization Q6H PRN Shela Leff, MD      . levothyroxine (SYNTHROID, LEVOTHROID) tablet 50 mcg  50 mcg Oral Daily Shela Leff, MD   50 mcg at 06/21/18 1029  . LORazepam (ATIVAN) tablet 1 mg  1 mg Oral Q6H PRN Bodenheimer, Charles A, NP   1 mg at 06/20/18 2313   Or  . LORazepam (ATIVAN) injection 1 mg  1 mg Intravenous Q6H PRN Bodenheimer, Charles A, NP      . metoprolol tartrate (LOPRESSOR) tablet 25 mg  25 mg Oral BID Barton Dubois, MD   25 mg at 06/21/18 6010  . multivitamin with minerals tablet 1 tablet  1 tablet Oral Daily Shela Leff, MD   1 tablet at 06/21/18 1031  . omega-3 acid ethyl esters (LOVAZA) capsule 2 g  2 g Oral Daily Shela Leff, MD   2 g at 06/21/18 0946  . pantoprazole (PROTONIX) EC tablet 40 mg  40 mg Oral BID AC Shela Leff, MD   40 mg at 06/21/18 0947  . PARoxetine (PAXIL) tablet 20 mg  20 mg Oral Daily Barton Dubois, MD   20 mg at 06/21/18 0947  . thiamine (VITAMIN B-1) tablet 100 mg  100 mg Oral Daily Shela Leff, MD   100 mg at 06/21/18 9323   Or  . thiamine (B-1) injection 100 mg  100 mg Intravenous Daily Shela Leff, MD      . vitamin A capsule 10,000 Units  10,000 Units Oral Daily Shela Leff, MD   10,000 Units at 06/21/18 1032  . vitamin C (ASCORBIC ACID) tablet 1,000 mg  1,000 mg Oral Daily Shela Leff, MD   1,000 mg at 06/21/18 0948  . vitamin E capsule 400 Units  400 Units Oral Daily Shela Leff, MD   400 Units at 06/21/18 5573     Discharge Medications: Please see discharge summary for a  list of discharge medications.  Relevant Imaging Results:  Relevant Lab Results:   Additional Information SSN [id number], Clydene Pugh, LCSW

## 2018-06-22 DIAGNOSIS — E860 Dehydration: Secondary | ICD-10-CM

## 2018-06-22 DIAGNOSIS — R0789 Other chest pain: Secondary | ICD-10-CM

## 2018-06-22 DIAGNOSIS — T391X1D Poisoning by 4-Aminophenol derivatives, accidental (unintentional), subsequent encounter: Secondary | ICD-10-CM

## 2018-06-22 LAB — BASIC METABOLIC PANEL
Anion gap: 7 (ref 5–15)
BUN: 8 mg/dL (ref 8–23)
CO2: 26 mmol/L (ref 22–32)
Calcium: 11.1 mg/dL — ABNORMAL HIGH (ref 8.9–10.3)
Chloride: 109 mmol/L (ref 98–111)
Creatinine, Ser: 0.66 mg/dL (ref 0.44–1.00)
Glucose, Bld: 112 mg/dL — ABNORMAL HIGH (ref 70–99)
Potassium: 2.9 mmol/L — ABNORMAL LOW (ref 3.5–5.1)
Sodium: 142 mmol/L (ref 135–145)

## 2018-06-22 MED ORDER — POTASSIUM CHLORIDE CRYS ER 20 MEQ PO TBCR
40.0000 meq | EXTENDED_RELEASE_TABLET | ORAL | Status: AC
  Administered 2018-06-22 (×3): 40 meq via ORAL
  Filled 2018-06-22 (×3): qty 2

## 2018-06-22 MED ORDER — MAGNESIUM SULFATE 2 GM/50ML IV SOLN
2.0000 g | Freq: Once | INTRAVENOUS | Status: AC
  Administered 2018-06-22: 2 g via INTRAVENOUS
  Filled 2018-06-22: qty 50

## 2018-06-22 NOTE — Progress Notes (Signed)
PROGRESS NOTE    Ashley Hall  HYQ:[id number] DOB: [birth year] DOA: 06/19/2018 PCP: Lucia Gaskins, MD     Brief Narrative:  73 y.o. female with medical history significant of GERD, alcohol abuse, arthritis, bipolar 1 disorder, chronic back pain, COPD, depression, hypertension, seizures presenting to the hospital via EMS for evaluation of AMS.  Per triage note, caregiver reported that patient fell yesterday.  She had a bottle of Norco at home EMS thinks she may be taking more pills than what is prescribed.  Norco was filled 06/14/2018, #120 and only 90 pills were present in the bottle.  Patient was somnolent on arrival but became more awake and alert after receiving a dose of Narcan.  Patient AAO x3 but very slow to respond to questions.  It was difficult to obtain a thorough history from her.  Reports taking Norco 2 tablets every 4 hours normally but thinks she might have taken 4 tablets this morning.  Reports taking Tylenol but is not sure how much.  States she falls at home all the time and yesterday again lost her balance and fell.  Denies loss of consciousness.  Does report having substernal chest pain at that time and dyspnea.  Denies having any associated diaphoresis or nausea.  Reports having fevers, chills, and a cough.     Assessment & Plan: 1-Acute encephalopathy -in the setting of electrolytes impairment and non-intentional narcotics/tylenol overdose. -Now with new component of hospital associated delirium and benzodiazepines side effects. -continue IVF's and advise for better oral hydration. -follow electrolytes and replete them/decrease their levels as needed (Calcium still high and K is low) -minimize sedative use of benzodiazepines and narcotics -follow mentation and clinical response. -If needed for severe agitation will use Haldol.  2-PNA: with concerns for aspiration in the setting of AMS/unintentional overdose -no fever -will continue Augmentin -follow clinical  response -Continue oxygen supplementation as needed.  3-Acetaminophen toxicity -tylenol level < 10  And LFT's WNL -acetadone discontinued -appreciate assistance by pharmacy  4-Fall at home, initial encounter -will follow PT/OT recommendations.  5-Chest pain -no ischemic changes on EKG or telemetry -Echo reassuring and troponin negative -no SOB. -pain most likely musculoskeletal  6-Hypokalemia, Hypomagnesemia and Hypercalcemia -all in the setting of dehydration and poor oral intake -will continue IVF's and continue repletion as needed  -Calcium still elevated and K still low -follow electrolytes trend -continue monitoring on telemetry for now.  7-Malnutrition of moderate degree -appreciate consultation by nutritional service -continue feeding supplements  8-HTN/tachycardia -Most likely associated with rebound tachycardia after stopping beta-blocker -Will continue metoprolol. -VSS today   DVT prophylaxis: Lovenox Code Status: Full Code. Family Communication: Niece updated at bedside  Disposition Plan: Continue to follow mentation, avoid use of heavy dosages of narcotics and avoid benzodiazepines; control pain using flector patch and low dose Fentanyl. Follow electrolytes and renal function. Continue IVF's.   Consultants:   None   Procedures:   2-D echo: EF 95-28%; grade 1 diastolic HF, mild LVH; no wall motion abnormalities.   Antimicrobials:  Anti-infectives (From admission, onward)   Start     Dose/Rate Route Frequency Ordered Stop   06/20/18 2200  amoxicillin-clavulanate (AUGMENTIN) 875-125 MG per tablet 1 tablet     1 tablet Oral Every 12 hours 06/20/18 1710     06/19/18 1615  cefTRIAXone (ROCEPHIN) 1 g in sodium chloride 0.9 % 100 mL IVPB  Status:  Discontinued     1 g 200 mL/hr over 30 Minutes Intravenous Every 24 hours 06/19/18 1608 06/20/18  1710   06/19/18 1615  azithromycin (ZITHROMAX) tablet 500 mg  Status:  Discontinued     500 mg Oral Every 24 hours  06/19/18 1608 06/20/18 1710      Subjective: No fever; reporting back pain. Mentation better, but still not back to baseline.  Objective: Vitals:   06/21/18 2359 06/22/18 0559 06/22/18 0849 06/22/18 1402  BP: (!) 147/59 (!) 157/99 (!) 157/99 (!) 145/66  Pulse: 96 (!) 104 (!) 104 88  Resp: 18 18  20   Temp: 98.7 F (37.1 C) 98 F (36.7 C)  99.9 F (37.7 C)  TempSrc: Oral Oral  Oral  SpO2: 98% 100%  96%  Weight:      Height:        Intake/Output Summary (Last 24 hours) at 06/22/2018 1945 Last data filed at 06/22/2018 0900 Gross per 24 hour  Intake 1244.13 ml  Output -  Net 1244.13 ml   Filed Weights   06/19/18 1304 06/20/18 1627  Weight: 56 kg 50.7 kg    Examination: General exam: Alert, awake, oriented x 2; but still not back to baseline according to family at bedside. Able to follow simple commands. Reporting excruciating back pain. Respiratory system: Clear to auscultation. Respiratory effort normal. Cardiovascular system:RRR. No murmurs, rubs, gallops. Gastrointestinal system: Abdomen is nondistended, soft and nontender. No organomegaly or masses felt. Normal bowel sounds heard. Central nervous system: Alert and oriented. No focal neurological deficits. Extremities: No C/C/E, +pedal pulses Skin: No rashes, lesions or ulcers Psychiatry: Judgement and insight appear normal. Mood & affect appropriate.    Data Reviewed: I have personally reviewed following labs and imaging studies  CBC: Recent Labs  Lab 06/19/18 1424 06/20/18 0514  WBC 12.0* 12.1*  NEUTROABS 9.7*  --   HGB 11.0* 11.3*  HCT 35.6* 34.7*  MCV 102.6* 98.6  PLT 118* 875*   Basic Metabolic Panel: Recent Labs  Lab 06/19/18 1424 06/19/18 1745 06/19/18 2334 06/20/18 0514 06/21/18 1029 06/22/18 0707  NA 139 139 138 139 139 142  K 2.9* 2.8* 2.6* 3.1* 3.0* 2.9*  CL 105 104 106 110 106 109  CO2 27 25 22 23 24 26   GLUCOSE 110* 101* 109* 119* 155* 112*  BUN 9 9 9  7* 6* 8  CREATININE 0.83 0.76  0.65 0.63 0.72 0.66  CALCIUM 12.1* 12.3* 11.1* 11.0* 11.4* 11.1*  MG 1.3* 1.4* 1.8  --   --   --    GFR: Estimated Creatinine Clearance: 51.8 mL/min (by C-G formula based on SCr of 0.66 mg/dL).   Liver Function Tests: Recent Labs  Lab 06/19/18 1424 06/19/18 1745 06/19/18 2334 06/20/18 0514 06/20/18 1614  AST 41 43* 38 36 36  ALT 17 17 16 16 15   ALKPHOS 94 109 82 87 83  BILITOT 0.4 0.8 0.8 0.3 0.9  PROT 6.0* 7.1 5.9* 5.9* 5.6*  ALBUMIN 2.8* 3.3* 2.7* 2.6* 2.4*    Recent Labs  Lab 06/19/18 1424  AMMONIA 23   Coagulation Profile: Recent Labs  Lab 06/19/18 1424  INR 1.06   Cardiac Enzymes: Recent Labs  Lab 06/19/18 1424 06/19/18 1745 06/19/18 2334 06/20/18 0514  TROPONINI <0.03 <0.03 <0.03 <0.03   Thyroid Function Tests: Recent Labs    06/20/18 0514  TSH 0.747   Anemia Panel: Recent Labs    06/20/18 0514  VITAMINB12 248  FOLATE 12.3  FERRITIN 1,389*  TIBC 164*  IRON 99   Urine analysis:    Component Value Date/Time   COLORURINE STRAW (A) 04/01/2017 0142  APPEARANCEUR CLEAR 04/01/2017 0142   LABSPEC 1.005 04/01/2017 0142   PHURINE 6.0 04/01/2017 0142   GLUCOSEU NEGATIVE 04/01/2017 0142   HGBUR NEGATIVE 04/01/2017 0142   BILIRUBINUR NEGATIVE 04/01/2017 0142   KETONESUR NEGATIVE 04/01/2017 0142   PROTEINUR NEGATIVE 04/01/2017 0142   UROBILINOGEN 0.2 05/06/2014 1225   NITRITE NEGATIVE 04/01/2017 0142   LEUKOCYTESUR NEGATIVE 04/01/2017 0142   Radiology Studies: No results found.  Scheduled Meds: . amoxicillin-clavulanate  1 tablet Oral Q12H  . diclofenac  1 patch Transdermal BID  . enoxaparin (LOVENOX) injection  40 mg Subcutaneous Q24H  . feeding supplement (ENSURE ENLIVE)  237 mL Oral BID BM  . ferrous gluconate  324 mg Oral Q breakfast  . folic acid  1 mg Oral Daily  . gabapentin  200 mg Oral TID  . levothyroxine  50 mcg Oral Daily  . metoprolol tartrate  25 mg Oral BID  . multivitamin with minerals  1 tablet Oral Daily  . omega-3  acid ethyl esters  2 g Oral Daily  . pantoprazole  40 mg Oral BID AC  . PARoxetine  20 mg Oral Daily  . potassium chloride  40 mEq Oral Q4H  . thiamine  100 mg Oral Daily   Or  . thiamine  100 mg Intravenous Daily  . vitamin A  10,000 Units Oral Daily  . vitamin C  1,000 mg Oral Daily  . vitamin E  400 Units Oral Daily   Continuous Infusions: . sodium chloride 75 mL/hr at 06/21/18 1727     LOS: 1 day    Time spent: 30 minutes   Barton Dubois, MD [facility] Pager [phone]  06/22/2018, 7:45 PM

## 2018-06-23 LAB — BASIC METABOLIC PANEL
Anion gap: 6 (ref 5–15)
BUN: 6 mg/dL — ABNORMAL LOW (ref 8–23)
CO2: 23 mmol/L (ref 22–32)
Calcium: 9.9 mg/dL (ref 8.9–10.3)
Chloride: 110 mmol/L (ref 98–111)
Creatinine, Ser: 0.67 mg/dL (ref 0.44–1.00)
GFR calc Af Amer: >60 mL/min (ref >60)
GLUCOSE: 90 mg/dL (ref 70–99)
Potassium: 3.6 mmol/L (ref 3.5–5.1)
Sodium: 139 mmol/L (ref 135–145)

## 2018-06-23 LAB — MAGNESIUM: MAGNESIUM: 1.9 mg/dL (ref 1.7–2.4)

## 2018-06-23 MED ORDER — BUSPIRONE HCL 5 MG PO TABS
5.0000 mg | ORAL_TABLET | Freq: Two times a day (BID) | ORAL | Status: DC
  Administered 2018-06-23 – 2018-06-27 (×9): 5 mg via ORAL
  Filled 2018-06-23 (×9): qty 1

## 2018-06-23 MED ORDER — FENTANYL CITRATE (PF) 100 MCG/2ML IJ SOLN
25.0000 ug | INTRAMUSCULAR | Status: DC | PRN
  Administered 2018-06-23 – 2018-06-25 (×3): 25 ug via INTRAVENOUS
  Filled 2018-06-23 (×3): qty 2

## 2018-06-23 NOTE — Progress Notes (Signed)
PROGRESS NOTE    SHANN LEWELLYN  KVQ:[id number] DOB: 1946/02/11 DOA: 06/19/2018 PCP: Lucia Gaskins, MD     Brief Narrative:  73 y.o. female with medical history significant of GERD, alcohol abuse, arthritis, bipolar 1 disorder, chronic back pain, COPD, depression, hypertension, seizures presenting to the hospital via EMS for evaluation of AMS.  Per triage note, caregiver reported that patient fell yesterday.  She had a bottle of Norco at home EMS thinks she may be taking more pills than what is prescribed.  Norco was filled 06/14/2018, #120 and only 90 pills were present in the bottle.  Patient was somnolent on arrival but became more awake and alert after receiving a dose of Narcan.  Patient AAO x3 but very slow to respond to questions.  It was difficult to obtain a thorough history from her.  Reports taking Norco 2 tablets every 4 hours normally but thinks she might have taken 4 tablets this morning.  Reports taking Tylenol but is not sure how much.  States she falls at home all the time and yesterday again lost her balance and fell.  Denies loss of consciousness.  Does report having substernal chest pain at that time and dyspnea.  Denies having any associated diaphoresis or nausea.  Reports having fevers, chills, and a cough.     Assessment & Plan: 1-Acute encephalopathy -in the setting of electrolytes impairment and non-intentional narcotics/tylenol overdose. -Now with new component of hospital associated delirium and benzodiazepines side effects. -continue IVF's and advise for better oral hydration. -Calcium level within normal limits now -minimize sedative use of benzodiazepines and narcotics -follow mentation and clinical response. -If needed for severe agitation will recommend the use of Haldol. -Mentation significantly improved and very close to baseline as per family members at bedside.  2-PNA: with concerns for aspiration in the setting of AMS/unintentional overdose -no  fever -will continue Augmentin -follow clinical response -Continue oxygen supplementation as needed.  3-Acetaminophen toxicity -tylenol level < 10  And LFT's WNL -acetadone discontinued -appreciate assistance by pharmacy  4-Fall at home, initial encounter -will follow PT/OT recommendations.  5-Chest pain -no ischemic changes on EKG or telemetry -Echo reassuring and troponin negative -no SOB. -pain most likely musculoskeletal -Will discontinue telemetry  6-Hypokalemia, Hypomagnesemia and Hypercalcemia -all in the setting of dehydration and poor oral intake -Continue IV fluids and continue repletion as needed -Calcium now within normal limits, potassium 3.6. -Discontinue telemetry.  7-Malnutrition of moderate degree -appreciate consultation by nutritional service -continue feeding supplements  8-HTN/tachycardia -Most likely associated with rebound tachycardia after stopping beta-blocker -Will continue metoprolol. -VSS today   DVT prophylaxis: Lovenox Code Status: Full Code. Family Communication: Niece updated at bedside  Disposition Plan: Continue to follow mentation, avoid use of heavy dosages of narcotics and avoid benzodiazepines; control pain using flector patch and low dose Fentanyl. Follow electrolytes and renal function. Continue IVF's.   Consultants:   None   Procedures:   2-D echo: EF 64-33%; grade 1 diastolic HF, mild LVH; no wall motion abnormalities.   Antimicrobials:  Anti-infectives (From admission, onward)   Start     Dose/Rate Route Frequency Ordered Stop   06/20/18 2200  amoxicillin-clavulanate (AUGMENTIN) 875-125 MG per tablet 1 tablet     1 tablet Oral Every 12 hours 06/20/18 1710     06/19/18 1615  cefTRIAXone (ROCEPHIN) 1 g in sodium chloride 0.9 % 100 mL IVPB  Status:  Discontinued     1 g 200 mL/hr over 30 Minutes Intravenous Every 24 hours 06/19/18  1608 06/20/18 1710   06/19/18 1615  azithromycin (ZITHROMAX) tablet 500 mg  Status:   Discontinued     500 mg Oral Every 24 hours 06/19/18 1608 06/20/18 1710      Subjective: Afebrile; continue complaining of musculoskeletal pain.  Mentation significantly improved and according to family member at bedside very close to her baseline currently.  Patient continued to be weak and deconditioned.  Objective: Vitals:   06/22/18 1937 06/22/18 2141 06/22/18 2142 06/23/18 0559  BP:   (!) 150/68 (!) 157/76  Pulse:  94 95 85  Resp:   18   Temp:  98.7 F (37.1 C) 98.8 F (37.1 C) 98.3 F (36.8 C)  TempSrc:  Oral Oral Oral  SpO2: 100% 96% 95% 95%  Weight:      Height:        Intake/Output Summary (Last 24 hours) at 06/23/2018 1344 Last data filed at 06/23/2018 0730 Gross per 24 hour  Intake 120 ml  Output -  Net 120 ml   Filed Weights   06/19/18 1304 06/20/18 1627  Weight: 56 kg 50.7 kg    Examination: General exam: Alert, awake, oriented x 3; following commands appropriately and answering questions without any problems.  Patient is eating and drinking better and currently denies any fever, nausea or vomiting.  She continues present generalized pain (specifically left upper costal area). Respiratory system: No wheezing, positive rhonchi, good air movement bilaterally.  Cardiovascular system:RRR. No murmurs, rubs, gallops. Gastrointestinal system: Abdomen is nondistended, soft and nontender. No organomegaly or masses felt. Normal bowel sounds heard. Central nervous system: Alert and oriented. No focal neurological deficits. Extremities: No C/C/E, +pedal pulses Skin: No rashes, lesions or ulcers Psychiatry: Judgement and insight appear normal. Mood & affect appropriate.    Data Reviewed: I have personally reviewed following labs and imaging studies  CBC: Recent Labs  Lab 06/19/18 1424 06/20/18 0514  WBC 12.0* 12.1*  NEUTROABS 9.7*  --   HGB 11.0* 11.3*  HCT 35.6* 34.7*  MCV 102.6* 98.6  PLT 118* 675*   Basic Metabolic Panel: Recent Labs  Lab 06/19/18 1424  06/19/18 1745 06/19/18 2334 06/20/18 0514 06/21/18 1029 06/22/18 0707 06/23/18 0646  NA 139 139 138 139 139 142 139  K 2.9* 2.8* 2.6* 3.1* 3.0* 2.9* 3.6  CL 105 104 106 110 106 109 110  CO2 27 25 22 23 24 26 23   GLUCOSE 110* 101* 109* 119* 155* 112* 90  BUN 9 9 9  7* 6* 8 6*  CREATININE 0.83 0.76 0.65 0.63 0.72 0.66 0.67  CALCIUM 12.1* 12.3* 11.1* 11.0* 11.4* 11.1* 9.9  MG 1.3* 1.4* 1.8  --   --   --  1.9   GFR: Estimated Creatinine Clearance: 51.8 mL/min (by C-G formula based on SCr of 0.67 mg/dL).   Liver Function Tests: Recent Labs  Lab 06/19/18 1424 06/19/18 1745 06/19/18 2334 06/20/18 0514 06/20/18 1614  AST 41 43* 38 36 36  ALT 17 17 16 16 15   ALKPHOS 94 109 82 87 83  BILITOT 0.4 0.8 0.8 0.3 0.9  PROT 6.0* 7.1 5.9* 5.9* 5.6*  ALBUMIN 2.8* 3.3* 2.7* 2.6* 2.4*    Recent Labs  Lab 06/19/18 1424  AMMONIA 23   Coagulation Profile: Recent Labs  Lab 06/19/18 1424  INR 1.06   Cardiac Enzymes: Recent Labs  Lab 06/19/18 1424 06/19/18 1745 06/19/18 2334 06/20/18 0514  TROPONINI <0.03 <0.03 <0.03 <0.03   Urine analysis:    Component Value Date/Time   COLORURINE STRAW (  A) 04/01/2017 0142   APPEARANCEUR CLEAR 04/01/2017 0142   LABSPEC 1.005 04/01/2017 0142   PHURINE 6.0 04/01/2017 0142   GLUCOSEU NEGATIVE 04/01/2017 0142   HGBUR NEGATIVE 04/01/2017 0142   BILIRUBINUR NEGATIVE 04/01/2017 0142   KETONESUR NEGATIVE 04/01/2017 0142   PROTEINUR NEGATIVE 04/01/2017 0142   UROBILINOGEN 0.2 05/06/2014 1225   NITRITE NEGATIVE 04/01/2017 0142   LEUKOCYTESUR NEGATIVE 04/01/2017 0142   Radiology Studies: No results found.  Scheduled Meds: . amoxicillin-clavulanate  1 tablet Oral Q12H  . busPIRone  5 mg Oral BID  . diclofenac  1 patch Transdermal BID  . enoxaparin (LOVENOX) injection  40 mg Subcutaneous Q24H  . feeding supplement (ENSURE ENLIVE)  237 mL Oral BID BM  . ferrous gluconate  324 mg Oral Q breakfast  . folic acid  1 mg Oral Daily  . gabapentin   200 mg Oral TID  . levothyroxine  50 mcg Oral Daily  . metoprolol tartrate  25 mg Oral BID  . multivitamin with minerals  1 tablet Oral Daily  . omega-3 acid ethyl esters  2 g Oral Daily  . pantoprazole  40 mg Oral BID AC  . PARoxetine  20 mg Oral Daily  . thiamine  100 mg Oral Daily   Or  . thiamine  100 mg Intravenous Daily  . vitamin A  10,000 Units Oral Daily  . vitamin C  1,000 mg Oral Daily  . vitamin E  400 Units Oral Daily   Continuous Infusions: . sodium chloride 75 mL/hr at 06/21/18 1727     LOS: 2 days    Time spent: 30 minutes   Barton Dubois, MD [facility] Pager [phone]  06/23/2018, 1:44 PM

## 2018-06-24 LAB — BASIC METABOLIC PANEL
Anion gap: 7 (ref 5–15)
BUN: 6 mg/dL — ABNORMAL LOW (ref 8–23)
CO2: 23 mmol/L (ref 22–32)
Calcium: 9.6 mg/dL (ref 8.9–10.3)
Chloride: 106 mmol/L (ref 98–111)
Creatinine, Ser: 0.63 mg/dL (ref 0.44–1.00)
GFR calc Af Amer: >60 mL/min (ref >60)
GFR calc non Af Amer: >60 mL/min (ref >60)
GLUCOSE: 96 mg/dL (ref 70–99)
Potassium: 3.6 mmol/L (ref 3.5–5.1)
Sodium: 136 mmol/L (ref 135–145)

## 2018-06-24 LAB — CBC
HCT: 33.1 % — ABNORMAL LOW (ref 36.0–46.0)
Hemoglobin: 10.3 g/dL — ABNORMAL LOW (ref 12.0–15.0)
MCH: 31.6 pg (ref 26.0–34.0)
MCHC: 31.1 g/dL (ref 30.0–36.0)
MCV: 101.5 fL — ABNORMAL HIGH (ref 80.0–100.0)
Platelets: 156 10*3/uL (ref 150–400)
RBC: 3.26 MIL/uL — ABNORMAL LOW (ref 3.87–5.11)
RDW: 12.2 % (ref 11.5–15.5)
WBC: 9.6 10*3/uL (ref 4.0–10.5)
nRBC: 0 % (ref 0.0–0.2)

## 2018-06-24 NOTE — Clinical Social Work Note (Signed)
Patient's bed offers were discussed. Patient request a private room.   Patient's niece, Marylin Crosby, advised of bed offer from [facility] and found it acceptable.  Kayla at [facility] to start insurance authorization and advised that she can provide a private room for patient.   Gabbrielle Mcnicholas, Clydene Pugh, LCSW

## 2018-06-24 NOTE — Progress Notes (Signed)
IV fluids:  IV placed and pulled out x 2. Dr. Dyann Kief notified. Will continue to monitor

## 2018-06-24 NOTE — Progress Notes (Signed)
Pt up in the chair per physical therapist @ 0800 today. Found on the floor with a pillow under her head per physical therapist and multiple staff now. Pt stated she slid to the floor because she did not want to sit up and put the pillow under head. No bruises or bleeding noted. Dr, Dyann Kief informed. I will continue to monitor.

## 2018-06-24 NOTE — Progress Notes (Signed)
Physical Therapy Treatment Patient Details Name: Ashley Hall MRN: [id number] DOB: Dec 31, 1945 Today's Date: 06/24/2018    History of Present Illness Ashley Hall is a 73 y.o. female with medical history significant of GERD, alcohol abuse, arthritis, bipolar 1 disorder, chronic back pain, COPD, depression, hypertension, seizures presenting to the hospital via EMS for evaluation of AMS.  Per triage note, caregiver reported that patient fell yesterday.  She had a bottle of Norco at home EMS thinks she may be taking more pills than what is prescribed.  Norco was filled 06/14/2018, #120 and only 90 pills were present in the bottle.  Patient was somnolent on arrival but became more awake and alert after receiving a dose of Narcan.    PT Comments    Patient presents supine in bed with O2 on and IV infusing, agitated, disoriented to place and situation, but is agreeable to therapy. Patient requires mod assist for supine to sit transfer demonstrating impulsive behavior, resistive movement, use of bed rail, and attempts to pull on therapist despite constant hand placement onto handrail and bed and education on safety. Patient requires mod assist for sit to/from stand transfers and stand pivot transfers using RW demonstrating unsteadiness on feet, verbal cues for sequencing, mod assist with RW management and guiding assistance to improve performance. Patient ambulates 2 feet with RW and mod assist demonstrating shuffling gait pattern, unsteadiness on feet, impulsive movement, and nonfluid gait pattern. Patient continues to have poor safety awareness and be limited due to agitation and confusion requiring constant reorientation to location and situation along with benefits and purpose of physical therapy to improve functional status. RN and nurse tech present in room for treatment assisting in educating patient and transfers. Patient verbalizes wanting to go outside and asks for ambulance to be called despite  education on being in hospital with medical assistance present in room. Patient left up in chair with chair alarm on with call bell in reach and RN and nurse tech in room. Patient will benefit from continued physical therapy in hospital and recommended venue below to increase strength, balance, endurance for safe ADLs and gait.    Follow Up Recommendations  SNF;Supervision/Assistance - 24 hour;Supervision for mobility/OOB     Equipment Recommendations  Rolling walker with 5" wheels;3in1 (PT)    Recommendations for Other Services       Precautions / Restrictions Precautions Precautions: Fall Restrictions Weight Bearing Restrictions: No    Mobility  Bed Mobility Overal bed mobility: Needs Assistance Bed Mobility: Supine to Sit     Supine to sit: Mod assist     General bed mobility comments: impulsive, resistive to movement secondary to pain and confusion, increased time, use of bed rail  Transfers Overall transfer level: Needs assistance Equipment used: Standard walker Transfers: Sit to/from Stand;Stand Pivot Transfers Sit to Stand: Mod assist Stand pivot transfers: Mod assist       General transfer comment: resistive to movement, unsteady on feet, verbal cues for sequencing, mod assist for RW management throughout transfers  Ambulation/Gait Ambulation/Gait assistance: Mod assist Gait Distance (Feet): 2 Feet Assistive device: Rolling walker (2 wheeled) Gait Pattern/deviations: Decreased step length - right;Decreased step length - left;Decreased stride length;Shuffle Gait velocity: decreased   General Gait Details: labored, unsteady, shuffling 3-4 steps at bedside, mod assist for RW amangement, no loss of balance   Stairs             Wheelchair Mobility    Modified Rankin (Stroke Patients Only)  Balance Overall balance assessment: Needs assistance Sitting-balance support: Feet supported;Bilateral upper extremity supported Sitting balance-Leahy  Scale: Poor Sitting balance - Comments: mod assist seated edge of bed secondary to pain complaints, resistive to mobility     Standing balance-Leahy Scale: Poor Standing balance comment: mod assist with RW, unsteady, resistive and impulsive                            Cognition Arousal/Alertness: Awake/alert Behavior During Therapy: Restless;Agitated;Impulsive Overall Cognitive Status: No family/caregiver present to determine baseline cognitive functioning                                 General Comments: Patient disoriented to location and situation requesting to open the front door, let her walk outside, and becoming agitated with therapy and nursing when reoriented and during treatment session      Exercises      General Comments        Pertinent Vitals/Pain Pain Assessment: Faces Faces Pain Scale: Hurts worst Pain Location: Patient reports low back pain Pain Descriptors / Indicators: Grimacing;Guarding;Moaning Pain Intervention(s): Limited activity within patient's tolerance;Monitored during session;Repositioned;Patient requesting pain meds-RN notified    Home Living                      Prior Function            PT Goals (current goals can now be found in the care plan section) Acute Rehab PT Goals Patient Stated Goal: return home PT Goal Formulation: With patient Time For Goal Achievement: 07/04/18 Potential to Achieve Goals: Fair Progress towards PT goals: Progressing toward goals    Frequency    Min 2X/week      PT Plan Current plan remains appropriate    Co-evaluation              AM-PAC PT "6 Clicks" Mobility   Outcome Measure  Help needed turning from your back to your side while in a flat bed without using bedrails?: A Lot Help needed moving from lying on your back to sitting on the side of a flat bed without using bedrails?: A Lot Help needed moving to and from a bed to a chair (including a wheelchair)?:  A Lot Help needed standing up from a chair using your arms (e.g., wheelchair or bedside chair)?: A Lot Help needed to walk in hospital room?: A Lot Help needed climbing 3-5 steps with a railing? : Total 6 Click Score: 11    End of Session Equipment Utilized During Treatment: Oxygen Activity Tolerance: Patient limited by fatigue;Patient limited by pain;Treatment limited secondary to agitation Patient left: in chair;with call bell/phone within reach;with chair alarm set;with nursing/sitter in room Nurse Communication: Mobility status PT Visit Diagnosis: Other abnormalities of gait and mobility (R26.89);Muscle weakness (generalized) (M62.81);Unsteadiness on feet (R26.81)     Time: 0810-0850 PT Time Calculation (min) (ACUTE ONLY): 40 min  Charges:  $Therapeutic Activity: 38-52 mins                     10:37 AM, 06/24/18 Talbot Grumbling, DPT Physical Therapist with [facility] [phone] office

## 2018-06-24 NOTE — Progress Notes (Signed)
PROGRESS NOTE    Ashley Hall  ONG:[id number] DOB: Jun 17, 1945 DOA: 06/19/2018 PCP: Lucia Gaskins, MD     Brief Narrative:  73 y.o. female with medical history significant of GERD, alcohol abuse, arthritis, bipolar 1 disorder, chronic back pain, COPD, depression, hypertension, seizures presenting to the hospital via EMS for evaluation of AMS.  Per triage note, caregiver reported that patient fell yesterday.  She had a bottle of Norco at home EMS thinks she may be taking more pills than what is prescribed.  Norco was filled 06/14/2018, #120 and only 90 pills were present in the bottle.  Patient was somnolent on arrival but became more awake and alert after receiving a dose of Narcan.  Patient AAO x3 but very slow to respond to questions.  It was difficult to obtain a thorough history from her.  Reports taking Norco 2 tablets every 4 hours normally but thinks she might have taken 4 tablets this morning.  Reports taking Tylenol but is not sure how much.  States she falls at home all the time and yesterday again lost her balance and fell.  Denies loss of consciousness.  Does report having substernal chest pain at that time and dyspnea.  Denies having any associated diaphoresis or nausea.  Reports having fevers, chills, and a cough.     Assessment & Plan: 1-Acute encephalopathy -in the setting of electrolytes impairment and non-intentional narcotics/tylenol overdose. -Now with new component of hospital associated delirium and benzodiazepines side effects. -Discontinue IV fluids and assess capacity of maintaining hydration orally. -Calcium level within normal limits now -minimize sedative use of benzodiazepines and narcotics -follow mentation and clinical response. -If needed for severe agitation will recommend the use of Haldol. -Mentation significantly improved and very close to baseline as per family members at bedside.  2-PNA: with concerns for aspiration in the setting of  AMS/unintentional overdose -no fever -will continue Augmentin -follow clinical response -Continue oxygen supplementation as needed.  3-Acetaminophen toxicity -tylenol level < 10  And LFT's WNL -acetadone discontinued -appreciate assistance by pharmacy  4-Fall at home, initial encounter -will follow PT/OT recommendations.  5-Chest pain -no ischemic changes on EKG or telemetry -Echo reassuring and troponin negative -no SOB. -pain most likely musculoskeletal -Will discontinue telemetry  6-Hypokalemia, Hypomagnesemia and Hypercalcemia -all in the setting of dehydration and poor oral intake -Patient advised to maintain adequate hydration and oral nutrition. -We will DC IV fluids. -Calcium and potassium within normal limits now -telemetry discontinued on 06/23/2018  7-Malnutrition of moderate degree -appreciate consultation by nutritional service -continue feeding supplements  8-HTN/tachycardia -Most likely associated with rebound tachycardia after stopping beta-blocker -Will continue metoprolol. -VSS today   DVT prophylaxis: Lovenox Code Status: Full Code. Family Communication: Niece updated at bedside  Disposition Plan: Continue to follow mentation, avoid use of heavy dosages of narcotics and avoid benzodiazepines; control pain using flector patch and low dose Fentanyl. Follow electrolytes and renal function.  Discontinue IV fluids and transition antibiotics to oral regimen.  Consultants:   None   Procedures:   2-D echo: EF 44-01%; grade 1 diastolic HF, mild LVH; no wall motion abnormalities.   Antimicrobials:  Anti-infectives (From admission, onward)   Start     Dose/Rate Route Frequency Ordered Stop   06/20/18 2200  amoxicillin-clavulanate (AUGMENTIN) 875-125 MG per tablet 1 tablet     1 tablet Oral Every 12 hours 06/20/18 1710     06/19/18 1615  cefTRIAXone (ROCEPHIN) 1 g in sodium chloride 0.9 % 100 mL IVPB  Status:  Discontinued     1 g 200 mL/hr over 30  Minutes Intravenous Every 24 hours 06/19/18 1608 06/20/18 1710   06/19/18 1615  azithromycin (ZITHROMAX) tablet 500 mg  Status:  Discontinued     500 mg Oral Every 24 hours 06/19/18 1608 06/20/18 1710      Subjective: No fever, continue complaining of bilateral hip and costal pain.  No nausea, no shortness of breath.  Overall calmer, oriented x3 and able to answer questions appropriately.  Objective: Vitals:   06/24/18 0422 06/24/18 0855 06/24/18 0958 06/24/18 1600  BP: (!) 159/75 (!) 142/77 (!) 172/78 (!) 158/76  Pulse: 84  84 90  Resp: 20  18   Temp: 98.3 F (36.8 C)  97.7 F (36.5 C)   TempSrc: Oral  Oral   SpO2: 97%  97% 96%  Weight:      Height:        Intake/Output Summary (Last 24 hours) at 06/24/2018 1740 Last data filed at 06/24/2018 1700 Gross per 24 hour  Intake 840 ml  Output -  Net 840 ml   Filed Weights   06/19/18 1304 06/20/18 1627  Weight: 56 kg 50.7 kg    Examination: General exam: Alert, awake, oriented x 3; patient with on weakness fall out of the chair today; she has just finished working with physical therapy when she develops back pain and tried to go back to bed on her own falling down.reported no hitting her head; having pain on her hips, but bearing weight and walking.  Respiratory system: Good air movement bilaterally, positive rhonchi; no wheezing.  Normal respiratory effort.   Cardiovascular system:RRR. No murmurs, rubs, gallops. Gastrointestinal system: Abdomen is nondistended, soft and nontender. No organomegaly or masses felt. Normal bowel sounds heard. Central nervous system: Alert and oriented. No focal neurological deficits. Extremities: No C/C/E, +pedal pulses Skin: No rashes, lesions or ulcers.  Multiple bruises and abrasions appreciated on her limbs. Psychiatry: Judgement and insight appear normal. Mood & affect appropriate.    Data Reviewed: I have personally reviewed following labs and imaging studies  CBC: Recent Labs  Lab  06/19/18 1424 06/20/18 0514 06/24/18 0522  WBC 12.0* 12.1* 9.6  NEUTROABS 9.7*  --   --   HGB 11.0* 11.3* 10.3*  HCT 35.6* 34.7* 33.1*  MCV 102.6* 98.6 101.5*  PLT 118* 122* 097   Basic Metabolic Panel: Recent Labs  Lab 06/19/18 1424 06/19/18 1745 06/19/18 2334 06/20/18 0514 06/21/18 1029 06/22/18 0707 06/23/18 0646 06/24/18 0522  NA 139 139 138 139 139 142 139 136  K 2.9* 2.8* 2.6* 3.1* 3.0* 2.9* 3.6 3.6  CL 105 104 106 110 106 109 110 106  CO2 27 25 22 23 24 26 23 23   GLUCOSE 110* 101* 109* 119* 155* 112* 90 96  BUN 9 9 9  7* 6* 8 6* 6*  CREATININE 0.83 0.76 0.65 0.63 0.72 0.66 0.67 0.63  CALCIUM 12.1* 12.3* 11.1* 11.0* 11.4* 11.1* 9.9 9.6  MG 1.3* 1.4* 1.8  --   --   --  1.9  --    GFR: Estimated Creatinine Clearance: 51.8 mL/min (by C-G formula based on SCr of 0.63 mg/dL).   Liver Function Tests: Recent Labs  Lab 06/19/18 1424 06/19/18 1745 06/19/18 2334 06/20/18 0514 06/20/18 1614  AST 41 43* 38 36 36  ALT 17 17 16 16 15   ALKPHOS 94 109 82 87 83  BILITOT 0.4 0.8 0.8 0.3 0.9  PROT 6.0* 7.1 5.9* 5.9* 5.6*  ALBUMIN 2.8*  3.3* 2.7* 2.6* 2.4*    Recent Labs  Lab 06/19/18 1424  AMMONIA 23   Coagulation Profile: Recent Labs  Lab 06/19/18 1424  INR 1.06   Cardiac Enzymes: Recent Labs  Lab 06/19/18 1424 06/19/18 1745 06/19/18 2334 06/20/18 0514  TROPONINI <0.03 <0.03 <0.03 <0.03   Urine analysis:    Component Value Date/Time   COLORURINE STRAW (A) 04/01/2017 0142   APPEARANCEUR CLEAR 04/01/2017 0142   LABSPEC 1.005 04/01/2017 0142   PHURINE 6.0 04/01/2017 0142   GLUCOSEU NEGATIVE 04/01/2017 0142   HGBUR NEGATIVE 04/01/2017 0142   BILIRUBINUR NEGATIVE 04/01/2017 0142   KETONESUR NEGATIVE 04/01/2017 0142   PROTEINUR NEGATIVE 04/01/2017 0142   UROBILINOGEN 0.2 05/06/2014 1225   NITRITE NEGATIVE 04/01/2017 0142   LEUKOCYTESUR NEGATIVE 04/01/2017 0142   Radiology Studies: No results found.  Scheduled Meds: . amoxicillin-clavulanate  1  tablet Oral Q12H  . busPIRone  5 mg Oral BID  . diclofenac  1 patch Transdermal BID  . enoxaparin (LOVENOX) injection  40 mg Subcutaneous Q24H  . feeding supplement (ENSURE ENLIVE)  237 mL Oral BID BM  . ferrous gluconate  324 mg Oral Q breakfast  . folic acid  1 mg Oral Daily  . gabapentin  200 mg Oral TID  . levothyroxine  50 mcg Oral Daily  . metoprolol tartrate  25 mg Oral BID  . multivitamin with minerals  1 tablet Oral Daily  . omega-3 acid ethyl esters  2 g Oral Daily  . pantoprazole  40 mg Oral BID AC  . PARoxetine  20 mg Oral Daily  . thiamine  100 mg Oral Daily   Or  . thiamine  100 mg Intravenous Daily  . vitamin A  10,000 Units Oral Daily  . vitamin C  1,000 mg Oral Daily  . vitamin E  400 Units Oral Daily   Continuous Infusions:    LOS: 3 days    Time spent: 30 minutes   Barton Dubois, MD [facility] Pager [phone]  06/24/2018, 5:40 PM

## 2018-06-24 NOTE — Care Management Important Message (Signed)
Important Message  Patient Details  Name: FAJR FIFE MRN: [id number] Date of Birth: 01-Jan-1946   Medicare Important Message Given:  Yes    Shelda Altes 06/24/2018, 1:53 PM

## 2018-06-25 MED ORDER — FENTANYL CITRATE (PF) 100 MCG/2ML IJ SOLN
25.0000 ug | INTRAMUSCULAR | Status: DC | PRN
  Administered 2018-06-25: 50 ug via INTRAVENOUS
  Filled 2018-06-25: qty 2

## 2018-06-25 MED ORDER — HALOPERIDOL LACTATE 5 MG/ML IJ SOLN
2.0000 mg | Freq: Three times a day (TID) | INTRAMUSCULAR | Status: DC | PRN
  Administered 2018-06-25 – 2018-06-26 (×3): 2 mg via INTRAVENOUS
  Filled 2018-06-25 (×4): qty 1

## 2018-06-25 MED ORDER — HYDROCODONE-ACETAMINOPHEN 5-325 MG PO TABS
1.0000 | ORAL_TABLET | Freq: Three times a day (TID) | ORAL | Status: DC | PRN
  Administered 2018-06-25 – 2018-06-27 (×7): 1 via ORAL
  Filled 2018-06-25 (×8): qty 1

## 2018-06-25 NOTE — Care Management Note (Signed)
Case Management Note  Patient Details  Name: Ashley Hall MRN: [id number] Date of Birth: Mar 04, 1946  If discussed at Long Length of Stay Meetings, dates discussed:  06/25/2018  Additional Comments:  Sherald Barge, RN 06/25/2018, 11:48 AM

## 2018-06-25 NOTE — Progress Notes (Signed)
PROGRESS NOTE    Ashley Hall  TGG:[id number] DOB: June 04, 1945 DOA: 06/19/2018 PCP: Lucia Gaskins, MD     Brief Narrative:  73 y.o. female with medical history significant of GERD, alcohol abuse, arthritis, bipolar 1 disorder, chronic back pain, COPD, depression, hypertension, seizures presenting to the hospital via EMS for evaluation of AMS.  Per triage note, caregiver reported that patient fell yesterday.  She had a bottle of Norco at home EMS thinks she may be taking more pills than what is prescribed.  Norco was filled 06/14/2018, #120 and only 90 pills were present in the bottle.  Patient was somnolent on arrival but became more awake and alert after receiving a dose of Narcan.  Patient AAO x3 but very slow to respond to questions.  It was difficult to obtain a thorough history from her.  Reports taking Norco 2 tablets every 4 hours normally but thinks she might have taken 4 tablets this morning.  Reports taking Tylenol but is not sure how much.  States she falls at home all the time and yesterday again lost her balance and fell.  Denies loss of consciousness.  Does report having substernal chest pain at that time and dyspnea.  Denies having any associated diaphoresis or nausea.  Reports having fevers, chills, and a cough.     Assessment & Plan: 1-Acute encephalopathy -in the setting of electrolytes impairment and non-intentional narcotics/tylenol overdose. -Now with new component of hospital associated delirium and benzodiazepines side effects. -Continue encouraging oral hydration and better nutrition. -Calcium level within normal limits now -minimize sedative use of benzodiazepines and narcotics -follow mentation and clinical response. -If needed for severe agitation will recommend the use of Haldol. -Mentation significantly improved and very close to baseline as per family members at bedside.  2-left lung PNA: with concerns for aspiration in the setting of AMS/unintentional  overdose -no fever -will continue Augmentin to complete antibiotic therapy (3 more days left on her treatment). -follow clinical response -Continue oxygen supplementation as needed and wean it off as tolerated.  3-Acetaminophen toxicity -tylenol level < 10  And LFT's WNL -acetadone discontinued -appreciate assistance by pharmacy  4-Fall at home, initial encounter -will follow PT/OT recommendations. -Recommendations given to pursuit skilled nursing facility placement.  5-Chest pain -no ischemic changes on EKG or telemetry -Echo reassuring and troponin negative x4 -no SOB or palpitations reported on exam.. -pain most likely musculoskeletal in nature. -Continue pain management.  6-Hypokalemia, Hypomagnesemia and Hypercalcemia -all in the setting of dehydration and poor oral intake -Continue to encourage oral hydration. -Calcium and potassium within normal limits now -telemetry discontinued on 06/23/2018, once with ability and electrolyte repletion achieved for 24 hours.  7-Malnutrition of moderate degree -appreciate consultation by nutritional service -continue feeding supplements  8-HTN/tachycardia -Most likely associated with rebound tachycardia after stopping beta-blocker -Will continue metoprolol. -Vital signs has remained stable today. -Patient denies palpitation.   DVT prophylaxis: Lovenox Code Status: Full Code. Family Communication: Niece updated at bedside  Disposition Plan: Continue to follow mentation, avoid use of heavy dosages of narcotics and avoid benzodiazepines; control pain using flector patch and low dose Vicodin. Follow electrolytes and renal function intermittently.  Waiting clearance and authorization to go to a skilled nursing facility for further care and rehabilitation.  Consultants:   None   Procedures:   2-D echo: EF 70-35%; grade 1 diastolic HF, mild LVH; no wall motion abnormalities.   Antimicrobials:  Anti-infectives (From admission,  onward)   Start     Dose/Rate Route Frequency  Ordered Stop   06/20/18 2200  amoxicillin-clavulanate (AUGMENTIN) 875-125 MG per tablet 1 tablet     1 tablet Oral Every 12 hours 06/20/18 1710     06/19/18 1615  cefTRIAXone (ROCEPHIN) 1 g in sodium chloride 0.9 % 100 mL IVPB  Status:  Discontinued     1 g 200 mL/hr over 30 Minutes Intravenous Every 24 hours 06/19/18 1608 06/20/18 1710   06/19/18 1615  azithromycin (ZITHROMAX) tablet 500 mg  Status:  Discontinued     500 mg Oral Every 24 hours 06/19/18 1608 06/20/18 1710      Subjective: Has remained afebrile; continue complaining of bilateral hip and costal pain (left more than right), no nausea, no vomiting, with overall improved mentation.  Reports feeling weak and deconditioned.  Objective: Vitals:   06/24/18 2237 06/25/18 0622 06/25/18 0915 06/25/18 1459  BP: (!) 165/78 (!) 142/62 (!) 141/65 (!) 143/65  Pulse: 92 77 66 74  Resp: 16 16 18 18   Temp: 98.9 F (37.2 C) (!) 97.5 F (36.4 C) 98.5 F (36.9 C)   TempSrc: Oral Oral Oral   SpO2: 94% 97%  95%  Weight:      Height:        Intake/Output Summary (Last 24 hours) at 06/25/2018 1730 Last data filed at 06/25/2018 0600 Gross per 24 hour  Intake 240 ml  Output -  Net 240 ml   Filed Weights   06/19/18 1304 06/20/18 1627  Weight: 56 kg 50.7 kg    Examination: General exam: Alert, awake, oriented x 3; answering questions appropriately and denying any nausea or vomiting.  Continue having some shortness of breath and productive cough.  She is afebrile. Respiratory system: Good air movement bilaterally, positive rhonchi right, no wheezing, normal respiratory effort.   Cardiovascular system:RRR. No murmurs, rubs, gallops. Gastrointestinal system: Abdomen is nondistended, soft and nontender. No organomegaly or masses felt. Normal bowel sounds heard. Central nervous system: Alert and oriented. No focal neurological deficits. Extremities: No C/C/E, +pedal pulses Skin: No open  wounds, multiple bruises and abrasions appreciated on her limbs. Psychiatry: Judgement and insight appear normal. Mood & affect appropriate.     Data Reviewed: I have personally reviewed following labs and imaging studies  CBC: Recent Labs  Lab 06/19/18 1424 06/20/18 0514 06/24/18 0522  WBC 12.0* 12.1* 9.6  NEUTROABS 9.7*  --   --   HGB 11.0* 11.3* 10.3*  HCT 35.6* 34.7* 33.1*  MCV 102.6* 98.6 101.5*  PLT 118* 122* 518   Basic Metabolic Panel: Recent Labs  Lab 06/19/18 1424 06/19/18 1745 06/19/18 2334 06/20/18 0514 06/21/18 1029 06/22/18 0707 06/23/18 0646 06/24/18 0522  NA 139 139 138 139 139 142 139 136  K 2.9* 2.8* 2.6* 3.1* 3.0* 2.9* 3.6 3.6  CL 105 104 106 110 106 109 110 106  CO2 27 25 22 23 24 26 23 23   GLUCOSE 110* 101* 109* 119* 155* 112* 90 96  BUN 9 9 9  7* 6* 8 6* 6*  CREATININE 0.83 0.76 0.65 0.63 0.72 0.66 0.67 0.63  CALCIUM 12.1* 12.3* 11.1* 11.0* 11.4* 11.1* 9.9 9.6  MG 1.3* 1.4* 1.8  --   --   --  1.9  --    GFR: Estimated Creatinine Clearance: 51.8 mL/min (by C-G formula based on SCr of 0.63 mg/dL).   Liver Function Tests: Recent Labs  Lab 06/19/18 1424 06/19/18 1745 06/19/18 2334 06/20/18 0514 06/20/18 1614  AST 41 43* 38 36 36  ALT 17 17 16 16  15  ALKPHOS 94 109 82 87 83  BILITOT 0.4 0.8 0.8 0.3 0.9  PROT 6.0* 7.1 5.9* 5.9* 5.6*  ALBUMIN 2.8* 3.3* 2.7* 2.6* 2.4*    Recent Labs  Lab 06/19/18 1424  AMMONIA 23   Coagulation Profile: Recent Labs  Lab 06/19/18 1424  INR 1.06   Cardiac Enzymes: Recent Labs  Lab 06/19/18 1424 06/19/18 1745 06/19/18 2334 06/20/18 0514  TROPONINI <0.03 <0.03 <0.03 <0.03   Urine analysis:    Component Value Date/Time   COLORURINE STRAW (A) 04/01/2017 0142   APPEARANCEUR CLEAR 04/01/2017 0142   LABSPEC 1.005 04/01/2017 0142   PHURINE 6.0 04/01/2017 0142   GLUCOSEU NEGATIVE 04/01/2017 0142   HGBUR NEGATIVE 04/01/2017 0142   BILIRUBINUR NEGATIVE 04/01/2017 0142   KETONESUR NEGATIVE  04/01/2017 0142   PROTEINUR NEGATIVE 04/01/2017 0142   UROBILINOGEN 0.2 05/06/2014 1225   NITRITE NEGATIVE 04/01/2017 0142   LEUKOCYTESUR NEGATIVE 04/01/2017 0142   Radiology Studies: No results found.  Scheduled Meds: . amoxicillin-clavulanate  1 tablet Oral Q12H  . busPIRone  5 mg Oral BID  . diclofenac  1 patch Transdermal BID  . enoxaparin (LOVENOX) injection  40 mg Subcutaneous Q24H  . feeding supplement (ENSURE ENLIVE)  237 mL Oral BID BM  . ferrous gluconate  324 mg Oral Q breakfast  . folic acid  1 mg Oral Daily  . gabapentin  200 mg Oral TID  . levothyroxine  50 mcg Oral Daily  . metoprolol tartrate  25 mg Oral BID  . multivitamin with minerals  1 tablet Oral Daily  . omega-3 acid ethyl esters  2 g Oral Daily  . pantoprazole  40 mg Oral BID AC  . PARoxetine  20 mg Oral Daily  . thiamine  100 mg Oral Daily   Or  . thiamine  100 mg Intravenous Daily  . vitamin A  10,000 Units Oral Daily  . vitamin C  1,000 mg Oral Daily  . vitamin E  400 Units Oral Daily   Continuous Infusions:    LOS: 4 days    Time spent: 30 minutes   Barton Dubois, MD [facility] Pager [phone]  06/25/2018, 5:30 PM

## 2018-06-26 MED ORDER — AMOXICILLIN-POT CLAVULANATE 875-125 MG PO TABS: 1.0000 | ORAL_TABLET | Freq: Two times a day (BID) | ORAL | 0 refills | Status: AC

## 2018-06-26 MED ORDER — HYDROCODONE-ACETAMINOPHEN 5-325 MG PO TABS
1.0000 | ORAL_TABLET | Freq: Once | ORAL | Status: AC
  Administered 2018-06-26: 1 via ORAL

## 2018-06-26 MED ORDER — FOLIC ACID 1 MG PO TABS: 1.0000 mg | ORAL_TABLET | Freq: Every day | ORAL | 0 refills | Status: DC

## 2018-06-26 MED ORDER — BUSPIRONE HCL 5 MG PO TABS: 5.0000 mg | ORAL_TABLET | Freq: Two times a day (BID) | ORAL | 0 refills | Status: DC

## 2018-06-26 MED ORDER — FERROUS GLUCONATE 324 (38 FE) MG PO TABS: 324.0000 mg | ORAL_TABLET | Freq: Every day | ORAL | 0 refills | Status: DC

## 2018-06-26 MED ORDER — DICLOFENAC EPOLAMINE 1.3 % TD PTCH: 1.0000 | MEDICATED_PATCH | Freq: Two times a day (BID) | TRANSDERMAL | 0 refills | Status: AC

## 2018-06-26 MED ORDER — ENSURE ENLIVE PO LIQD: 237.0000 mL | Freq: Two times a day (BID) | ORAL | 12 refills | Status: AC

## 2018-06-26 MED ORDER — HYDROCODONE-ACETAMINOPHEN 5-325 MG PO TABS: 1.0000 | ORAL_TABLET | Freq: Four times a day (QID) | ORAL | 0 refills | Status: AC | PRN

## 2018-06-26 MED ORDER — GABAPENTIN 100 MG PO CAPS: 200.0000 mg | ORAL_CAPSULE | Freq: Three times a day (TID) | ORAL | 0 refills | Status: DC

## 2018-06-26 NOTE — Progress Notes (Signed)
Patient is medically and psychiatrically stable for discharge to a skilled facility.

## 2018-06-26 NOTE — Progress Notes (Signed)
Physical Therapy Treatment Patient Details Name: Ashley Hall MRN: [id number] DOB: 04-18-1946 Today's Date: 06/26/2018    History of Present Illness Ashley Hall is a 73 y.o. female with medical history significant of GERD, alcohol abuse, arthritis, bipolar 1 disorder, chronic back pain, COPD, depression, hypertension, seizures presenting to the hospital via EMS for evaluation of AMS.  Per triage note, caregiver reported that patient fell yesterday.  She had a bottle of Norco at home EMS thinks she may be taking more pills than what is prescribed.  Norco was filled 06/14/2018, #120 and only 90 pills were present in the bottle.  Patient was somnolent on arrival but became more awake and alert after receiving a dose of Narcan.    PT Comments    Patient presents supine in bed and agreeable to therapy with encouragement to participate. Patient oriented to self and hospital, but not oriented to situation or date. Patient agitated that her pain medication isn't being given more frequently despite proper dosages being administered and requires frequent redirection to task. Patient performs supine to sit with mod assist, verbal cues for log rolling to improve back pain with movement, use of bed rail, and attempts to pull on therapist to upright trunk. Patient performs sit to/from stand reps without RW due to refusal to use requiring mod assist, unsteadiness upon standing noted with maintaining bil flexed knees. Patient performs stand pivot transfers bed<>BSC and bed<>chair with mod assist and again refuses to use RW requiring hand held assist, constant verbal cues for hand placement. Patient ambulates 5 ft with labored, slow, shuffling steps at bedside with hand held assist due to refusal to use RW requiring steadying assistance. Patient impulsive throughout treatment attempting to sit prior to reaching seated surfaces, lying down across bed mattress perpendicularly, and disregarding all verbal cues for  safety and education on RW usage. Patient continues to be limited by cognition, low back pain and refuses to ambulate further distance or perform seated BLE strengthening exercises today. RN in room for part of treatment attempting to encourage patient in therapy participation and assisting in cleaning patient up. Patient left in bed, bed alarm on, and call bell in hand. Patient will benefit from continued physical therapy in hospital and recommended venue below to increase strength, balance, endurance for safe ADLs and gait.    Follow Up Recommendations  SNF;Supervision/Assistance - 24 hour;Supervision for mobility/OOB     Equipment Recommendations  Rolling walker with 5" wheels;3in1 (PT)    Recommendations for Other Services       Precautions / Restrictions Precautions Precautions: Fall Restrictions Weight Bearing Restrictions: No    Mobility  Bed Mobility Overal bed mobility: Needs Assistance Bed Mobility: Supine to Sit;Sit to Supine     Supine to sit: Mod assist Sit to supine: Min guard   General bed mobility comments: verbal and tactile cues for log roll and using bedrail to assist in uprighing trunk with fair carryover, mod assist for trunk management, impulsive with sit to supine requiring constant verbal and tactile cues for safety  Transfers Overall transfer level: Needs assistance Equipment used: 1 person hand held assist Transfers: Sit to/from [employer] Transfers Sit to Stand: Mod assist Stand pivot transfers: Mod assist       General transfer comment: labored movement with frequent guarding due to back pain, unsteadiness on feet, maintains bil knee flexion, refuses to use RW  Ambulation/Gait Ambulation/Gait assistance: Mod assist Gait Distance (Feet): 5 Feet Assistive device: 1 person hand held assist Gait  Pattern/deviations: Decreased step length - right;Decreased step length - left;Decreased stride length Gait velocity: decreased   General Gait  Details: slow labored steps at bedside, unsteadiness on feet, maintains bil knee flexion throughout gait cycle, flexed trunk, refuses to use RW   Chief Strategy Officer    Modified Rankin (Stroke Patients Only)       Balance Overall balance assessment: Needs assistance Sitting-balance support: Feet supported;Bilateral upper extremity supported Sitting balance-Leahy Scale: Poor Sitting balance - Comments: limited for sitting due to increasing back pain, frequently falls to side to avoid pressure on bottom   Standing balance support: Bilateral upper extremity supported;During functional activity Standing balance-Leahy Scale: Poor Standing balance comment: hand held assist                            Cognition Arousal/Alertness: Awake/alert Behavior During Therapy: Restless;Agitated;Impulsive Overall Cognitive Status: Within Functional Limits for tasks assessed                                 General Comments: encouragement to participate, oriented to self and hospital but not situation or date      Exercises      General Comments        Pertinent Vitals/Pain Pain Assessment: Faces Faces Pain Scale: Hurts even more Pain Location: low back Pain Descriptors / Indicators: Grimacing;Guarding;Moaning Pain Intervention(s): Limited activity within patient's tolerance;Monitored during session;Repositioned;Patient requesting pain meds-RN notified    Home Living                      Prior Function            PT Goals (current goals can now be found in the care plan section) Acute Rehab PT Goals Patient Stated Goal: return home PT Goal Formulation: With patient Time For Goal Achievement: 07/04/18 Potential to Achieve Goals: Fair Progress towards PT goals: Progressing toward goals    Frequency    Min 2X/week      PT Plan Current plan remains appropriate    Co-evaluation              AM-PAC PT "6  Clicks" Mobility   Outcome Measure  Help needed turning from your back to your side while in a flat bed without using bedrails?: A Lot Help needed moving from lying on your back to sitting on the side of a flat bed without using bedrails?: A Lot Help needed moving to and from a bed to a chair (including a wheelchair)?: A Lot Help needed standing up from a chair using your arms (e.g., wheelchair or bedside chair)?: A Lot Help needed to walk in hospital room?: A Lot Help needed climbing 3-5 steps with a railing? : Total 6 Click Score: 11    End of Session Equipment Utilized During Treatment: Gait belt Activity Tolerance: Patient limited by fatigue;Patient limited by pain;Treatment limited secondary to agitation Patient left: in bed;with call bell/phone within reach;with bed alarm set Nurse Communication: Mobility status PT Visit Diagnosis: Other abnormalities of gait and mobility (R26.89);Muscle weakness (generalized) (M62.81);Unsteadiness on feet (R26.81)     Time: 1779-3903 PT Time Calculation (min) (ACUTE ONLY): 35 min  Charges:  $Therapeutic Exercise: 8-22 mins $Therapeutic Activity: 23-37 mins  11:37 AM, 06/26/18 Talbot Grumbling, DPT Physical Therapist with [facility] [phone] office

## 2018-06-26 NOTE — Clinical Social Work Note (Signed)
Facility has Ship broker. She is awaiting PASARR number assignment.    Rice Walsh, Clydene Pugh, LCSW

## 2018-06-26 NOTE — Progress Notes (Signed)
Physical Therapy Treatment Patient Details Name: Ashley Hall MRN: [id number] DOB: August 17, 1945 Today's Date: 06/26/2018    History of Present Illness Michaelina Blandino Klem is a 73 y.o. female with medical history significant of GERD, alcohol abuse, arthritis, bipolar 1 disorder, chronic back pain, COPD, depression, hypertension, seizures presenting to the hospital via EMS for evaluation of AMS.  Per triage note, caregiver reported that patient fell yesterday.  She had a bottle of Norco at home EMS thinks she may be taking more pills than what is prescribed.  Norco was filled 06/14/2018, #120 and only 90 pills were present in the bottle.  Patient was somnolent on arrival but became more awake and alert after receiving a dose of Narcan.    PT Comments    LATE ENTRY FOR 06/25/18 DUE TO COMPUTER SHUT DOWN.  Patient demonstrates increased endurance/distance for taking steps in room, but limited overall due to c/o severe back pain and unable to tolerated sitting up in chair after therapy.  Patient put back to bed and tolerated lying in side lying position.  Patient will benefit from continued physical therapy in hospital and recommended venue below to increase strength, balance, endurance for safe ADLs and gait.    Follow Up Recommendations  SNF;Supervision/Assistance - 24 hour;Supervision for mobility/OOB     Equipment Recommendations  Rolling walker with 5" wheels;3in1 (PT)    Recommendations for Other Services       Precautions / Restrictions Precautions Precautions: Fall Restrictions Weight Bearing Restrictions: No    Mobility  Bed Mobility Overal bed mobility: Needs Assistance Bed Mobility: Supine to Sit;Sit to Supine     Supine to sit: Mod assist Sit to supine: Min assist   General bed mobility comments: requires repeated verbal/tactile cueing for log rolling technique with fair carryover  Transfers Overall transfer level: Needs assistance Equipment used: Rolling walker (2  wheeled) Transfers: Sit to/from [employer] Sit to Stand: Min assist;Mod assist Stand pivot transfers: Min assist;Mod assist       General transfer comment: labored movement with frequent guarding due to back pain  Ambulation/Gait Ambulation/Gait assistance: Mod assist Gait Distance (Feet): 15 Feet Assistive device: Rolling walker (2 wheeled) Gait Pattern/deviations: Decreased step length - right;Decreased step length - left;Decreased stride length Gait velocity: decreased   General Gait Details: slow labored movement, buckling of knees and had to sit before walking back to bedside   Stairs             Wheelchair Mobility    Modified Rankin (Stroke Patients Only)       Balance Overall balance assessment: Needs assistance Sitting-balance support: Feet supported;Bilateral upper extremity supported Sitting balance-Leahy Scale: Poor Sitting balance - Comments: limitd for sitting due to increasing back pain, frequently falls over to avoid pressure on bottom   Standing balance support: Bilateral upper extremity supported;During functional activity Standing balance-Leahy Scale: Fair Standing balance comment: using RW                            Cognition Arousal/Alertness: Awake/alert Behavior During Therapy: Restless;Agitated;Impulsive Overall Cognitive Status: Within Functional Limits for tasks assessed                                 General Comments: requires encouragement to participate      Exercises      General Comments        Pertinent  Vitals/Pain Faces Pain Scale: Hurts even more Pain Descriptors / Indicators: Grimacing;Guarding;Moaning    Home Living                      Prior Function            PT Goals (current goals can now be found in the care plan section) Acute Rehab PT Goals Patient Stated Goal: return home PT Goal Formulation: With patient Time For Goal Achievement:  07/04/18 Potential to Achieve Goals: Fair    Frequency    Min 2X/week      PT Plan Current plan remains appropriate    Co-evaluation              AM-PAC PT "6 Clicks" Mobility   Outcome Measure  Help needed turning from your back to your side while in a flat bed without using bedrails?: A Lot Help needed moving from lying on your back to sitting on the side of a flat bed without using bedrails?: A Lot Help needed moving to and from a bed to a chair (including a wheelchair)?: A Lot Help needed standing up from a chair using your arms (e.g., wheelchair or bedside chair)?: A Lot Help needed to walk in hospital room?: A Lot Help needed climbing 3-5 steps with a railing? : Total 6 Click Score: 11    End of Session Equipment Utilized During Treatment: Gait belt   Patient left: in bed;with call bell/phone within reach Nurse Communication: Mobility status PT Visit Diagnosis: Other abnormalities of gait and mobility (R26.89);Muscle weakness (generalized) (M62.81);Unsteadiness on feet (R26.81)     Time: 4235-3614 PT Time Calculation (min) (ACUTE ONLY): 29 min  Charges:  $Therapeutic Exercise: 8-22 mins $Therapeutic Activity: 8-22 mins                     7:45 AM, 06/26/18 Lonell Grandchild, MPT Physical Therapist with [facility] [phone] office [phone] mobile phone

## 2018-06-26 NOTE — Progress Notes (Signed)
PROGRESS NOTE    Ashley Hall  HGD:[id number] DOB: [birth year] DOA: 06/19/2018 PCP: Lucia Gaskins, MD   Brief Narrative:  73 y.o.femalewith medical history significant ofGERD, alcohol abuse, arthritis, bipolar 1 disorder, chronic back pain, COPD, depression, hypertension, seizures presenting to the hospital via EMS for evaluation ofAMS.Per triage note, caregiver reported that patient fell yesterday.She hada bottle of Norco at home EMS thinks she may be taking more pills than what is prescribed. Norco was filled 06/14/2018, #120 and only 90 pills were present in the bottle. Patient was somnolent on arrival but became more awake and alert after receiving a dose of Narcan.  Patient AAO x3 butveryslow to respond to questions. It was difficult to obtain a thorough history from her. Reports taking Norco 2 tablets every 4 hours normally but thinks she might have taken 4 tablets this morning. Reports taking Tylenol but is not sure how much. States she falls at home all the time and yesterday again lost her balance and fell. Denies loss of consciousness. Does report having substernal chest pain at that time and dyspnea. Denies having any associated diaphoresis or nausea. Reports having fevers, chills, and a cough.   Assessment & Plan:   Principal Problem:   Acute encephalopathy Active Problems:   CAP (community acquired pneumonia)   Opioid overdose ([place])   Acetaminophen toxicity   Fall at home, initial encounter   Chest pain   Hypokalemia   Hypomagnesemia   Hypercalcemia   Malnutrition of moderate degree  1-Acute encephalopathy-improved -in the setting of electrolytes impairment and non-intentional narcotics/tylenol overdose. -Now with new component of hospital associated delirium and benzodiazepines side effects. -Continue encouraging oral hydration and better nutrition. -Calcium level within normal limits now -minimize sedative use of benzodiazepines and  narcotics -follow mentation and clinical response. -If needed for severe agitation will recommend the use of Haldol. -Mentation significantly improved and very close to baseline as per family members at bedside on 1/28.  2-left lung PNA: with concerns for aspiration in the setting of AMS/unintentional overdose -no fever -will continue Augmentin to complete antibiotic therapy (2 more days left on her treatment). -follow clinical response -Continue oxygen supplementation as needed and wean it off as tolerated.  3-Acetaminophen toxicity -tylenol level < 10  And LFT's WNL -acetadone discontinued -appreciate assistance by pharmacy  4-Fall at home, initial encounter -will follow PT/OT recommendations. -Recommendations given to pursuit skilled nursing facility placement which CSW is working on.  5-Chest pain -no ischemic changes on EKG or telemetry -Echo reassuring and troponin negative x4 -no SOB or palpitations reported on exam.. -pain most likely musculoskeletal in nature. -Continue pain management.  6-Hypokalemia, Hypomagnesemia and Hypercalcemia -all in the setting of dehydration and poor oral intake -Continue to encourage oral hydration. -Calcium and potassium within normal limits now -telemetry discontinued on 06/23/2018, once with ability and electrolyte repletion achieved for 24 hours.  7-Malnutrition of moderate degree -appreciate consultation by nutritional service -continue feeding supplements  8-HTN/tachycardia-improved -Most likely associated with rebound tachycardia after stopping beta-blocker -Will continue metoprolol. -Vital signs has remained stable today. -Patient denies palpitation.   DVT prophylaxis: Lovenox Code Status: Full Code. Family Communication: None at bedside Disposition Plan: Continue to follow mentation, avoid use of heavy dosages of narcotics and avoid benzodiazepines; control pain using flector patch and low dose Vicodin. Follow  electrolytes and renal function intermittently.  Waiting clearance and authorization to go to a skilled nursing facility for further care and rehabilitation.  Consultants:   None   Procedures:  2-D echo: EF 53-74%; grade 1 diastolic HF, mild LVH; no wall motion abnormalities.    Antimicrobials:  Augmentin 1/23->   Subjective: Patient seen and evaluated today with no new acute complaints or concerns. No acute concerns or events noted overnight.  She complains of some low back pain and some headaches.  Objective: Vitals:   06/25/18 1459 06/25/18 2119 06/26/18 0531 06/26/18 0531  BP: (!) 143/65 (!) 146/68 (!) 145/64 (!) 145/64  Pulse: 74 84 71 65  Resp: 18 18 18 18   Temp:  98.7 F (37.1 C) 99 F (37.2 C) 99 F (37.2 C)  TempSrc:  Oral Oral Oral  SpO2: 95% 97% 98% 98%  Weight:      Height:        Intake/Output Summary (Last 24 hours) at 06/26/2018 1528 Last data filed at 06/26/2018 0600 Gross per 24 hour  Intake 360 ml  Output -  Net 360 ml   Filed Weights   06/19/18 1304 06/20/18 1627  Weight: 56 kg 50.7 kg    Examination:  General exam: Appears calm and comfortable  Respiratory system: Clear to auscultation. Respiratory effort normal. Cardiovascular system: S1 & S2 heard, RRR. No JVD, murmurs, rubs, gallops or clicks. No pedal edema. Gastrointestinal system: Abdomen is nondistended, soft and nontender. No organomegaly or masses felt. Normal bowel sounds heard. Central nervous system: Alert and oriented. No focal neurological deficits. Extremities: Symmetric 5 x 5 power. Skin: No rashes, lesions or ulcers Psychiatry: Judgement and insight appear normal. Mood & affect appropriate.     Data Reviewed: I have personally reviewed following labs and imaging studies  CBC: Recent Labs  Lab 06/20/18 0514 06/24/18 0522  WBC 12.1* 9.6  HGB 11.3* 10.3*  HCT 34.7* 33.1*  MCV 98.6 101.5*  PLT 122* 827   Basic Metabolic Panel: Recent Labs  Lab 06/19/18 1745  06/19/18 2334 06/20/18 0514 06/21/18 1029 06/22/18 0707 06/23/18 0646 06/24/18 0522  NA 139 138 139 139 142 139 136  K 2.8* 2.6* 3.1* 3.0* 2.9* 3.6 3.6  CL 104 106 110 106 109 110 106  CO2 25 22 23 24 26 23 23   GLUCOSE 101* 109* 119* 155* 112* 90 96  BUN 9 9 7* 6* 8 6* 6*  CREATININE 0.76 0.65 0.63 0.72 0.66 0.67 0.63  CALCIUM 12.3* 11.1* 11.0* 11.4* 11.1* 9.9 9.6  MG 1.4* 1.8  --   --   --  1.9  --    GFR: Estimated Creatinine Clearance: 51.8 mL/min (by C-G formula based on SCr of 0.63 mg/dL). Liver Function Tests: Recent Labs  Lab 06/19/18 1745 06/19/18 2334 06/20/18 0514 06/20/18 1614  AST 43* 38 36 36  ALT 17 16 16 15   ALKPHOS 109 82 87 83  BILITOT 0.8 0.8 0.3 0.9  PROT 7.1 5.9* 5.9* 5.6*  ALBUMIN 3.3* 2.7* 2.6* 2.4*   No results for input(s): LIPASE, AMYLASE in the last 168 hours. No results for input(s): AMMONIA in the last 168 hours. Coagulation Profile: No results for input(s): INR, PROTIME in the last 168 hours. Cardiac Enzymes: Recent Labs  Lab 06/19/18 1745 06/19/18 2334 06/20/18 0514  TROPONINI <0.03 <0.03 <0.03   BNP (last 3 results) No results for input(s): PROBNP in the last 8760 hours. HbA1C: No results for input(s): HGBA1C in the last 72 hours. CBG: No results for input(s): GLUCAP in the last 168 hours. Lipid Profile: No results for input(s): CHOL, HDL, LDLCALC, TRIG, CHOLHDL, LDLDIRECT in the last 72 hours. Thyroid Function Tests: No  results for input(s): TSH, T4TOTAL, FREET4, T3FREE, THYROIDAB in the last 72 hours. Anemia Panel: No results for input(s): VITAMINB12, FOLATE, FERRITIN, TIBC, IRON, RETICCTPCT in the last 72 hours. Sepsis Labs: No results for input(s): PROCALCITON, LATICACIDVEN in the last 168 hours.  No results found for this or any previous visit (from the past 240 hour(s)).       Radiology Studies: No results found.      Scheduled Meds: . amoxicillin-clavulanate  1 tablet Oral Q12H  . busPIRone  5 mg Oral BID   . diclofenac  1 patch Transdermal BID  . enoxaparin (LOVENOX) injection  40 mg Subcutaneous Q24H  . feeding supplement (ENSURE ENLIVE)  237 mL Oral BID BM  . ferrous gluconate  324 mg Oral Q breakfast  . folic acid  1 mg Oral Daily  . gabapentin  200 mg Oral TID  . levothyroxine  50 mcg Oral Daily  . metoprolol tartrate  25 mg Oral BID  . multivitamin with minerals  1 tablet Oral Daily  . omega-3 acid ethyl esters  2 g Oral Daily  . pantoprazole  40 mg Oral BID AC  . PARoxetine  20 mg Oral Daily  . thiamine  100 mg Oral Daily   Or  . thiamine  100 mg Intravenous Daily  . vitamin C  1,000 mg Oral Daily  . vitamin E  400 Units Oral Daily   Continuous Infusions:   LOS: 5 days    Time spent: 30 minutes    Labarron Durnin Darleen Crocker, DO [facility] Pager [phone]  If 7PM-7AM, please contact night-coverage [email or web address] Password [email or web address] 06/26/2018, 3:28 PM

## 2018-06-27 LAB — CBC
HEMATOCRIT: 31.2 % — AB (ref 36.0–46.0)
Hemoglobin: 9.9 g/dL — ABNORMAL LOW (ref 12.0–15.0)
MCH: 31.9 pg (ref 26.0–34.0)
MCHC: 31.7 g/dL (ref 30.0–36.0)
MCV: 100.6 fL — ABNORMAL HIGH (ref 80.0–100.0)
Platelets: 224 10*3/uL (ref 150–400)
RBC: 3.1 MIL/uL — ABNORMAL LOW (ref 3.87–5.11)
RDW: 12 % (ref 11.5–15.5)
WBC: 9 10*3/uL (ref 4.0–10.5)
nRBC: 0 % (ref 0.0–0.2)

## 2018-06-27 LAB — BASIC METABOLIC PANEL
Anion gap: 6 (ref 5–15)
BUN: 6 mg/dL — AB (ref 8–23)
CO2: 25 mmol/L (ref 22–32)
Calcium: 8.4 mg/dL — ABNORMAL LOW (ref 8.9–10.3)
Chloride: 107 mmol/L (ref 98–111)
Creatinine, Ser: 0.59 mg/dL (ref 0.44–1.00)
GFR calc Af Amer: >60 mL/min (ref >60)
GFR calc non Af Amer: >60 mL/min (ref >60)
Glucose, Bld: 96 mg/dL (ref 70–99)
Potassium: 3 mmol/L — ABNORMAL LOW (ref 3.5–5.1)
Sodium: 138 mmol/L (ref 135–145)

## 2018-06-27 MED ORDER — PAROXETINE HCL 20 MG PO TABS: 20.0000 mg | ORAL_TABLET | Freq: Every morning | ORAL | 0 refills | Status: AC

## 2018-06-27 MED ORDER — GABAPENTIN 300 MG PO CAPS
300.0000 mg | ORAL_CAPSULE | Freq: Three times a day (TID) | ORAL | Status: DC
  Administered 2018-06-27: 300 mg via ORAL
  Filled 2018-06-27: qty 1

## 2018-06-27 MED ORDER — GABAPENTIN 300 MG PO CAPS: 300.0000 mg | ORAL_CAPSULE | Freq: Three times a day (TID) | ORAL | 0 refills | Status: DC

## 2018-06-27 MED ORDER — TOPIRAMATE 100 MG PO TABS: 100.0000 mg | ORAL_TABLET | Freq: Every day | ORAL | 0 refills | Status: AC

## 2018-06-27 MED ORDER — PAROXETINE HCL 20 MG PO TABS
20.0000 mg | ORAL_TABLET | Freq: Every morning | ORAL | Status: DC
  Administered 2018-06-27: 20 mg via ORAL
  Filled 2018-06-27: qty 1

## 2018-06-27 MED ORDER — POTASSIUM CHLORIDE CRYS ER 20 MEQ PO TBCR
40.0000 meq | EXTENDED_RELEASE_TABLET | Freq: Two times a day (BID) | ORAL | Status: DC
  Administered 2018-06-27: 40 meq via ORAL
  Filled 2018-06-27: qty 2

## 2018-06-27 MED ORDER — TOPIRAMATE 100 MG PO TABS
100.0000 mg | ORAL_TABLET | Freq: Every day | ORAL | Status: DC
  Administered 2018-06-27: 100 mg via ORAL
  Filled 2018-06-27: qty 1

## 2018-06-27 NOTE — Progress Notes (Signed)
Physical Therapy Treatment Patient Details Name: Ashley Hall MRN: [id number] DOB: [birth year] Today's Date: 06/27/2018    History of Present Illness Ashley Hall is a 73 y.o. female with medical history significant of GERD, alcohol abuse, arthritis, bipolar 1 disorder, chronic back pain, COPD, depression, hypertension, seizures presenting to the hospital via EMS for evaluation of AMS.  Per triage note, caregiver reported that patient fell yesterday.  She had a bottle of Norco at home EMS thinks she may be taking more pills than what is prescribed.  Norco was filled 06/14/2018, #120 and only 90 pills were present in the bottle.  Patient was somnolent on arrival but became more awake and alert after receiving a dose of Narcan.    PT Comments    Patient presents supine in bed with doctor in room educating patient to participate in therapy and increase mobility to improve functional status to return home. Patient agreeable to therapy with much encouragement. Patient limited by attitude refusing to perform transfers or ambulate this morning despite much encouragement and education on benefits. Patient tolerates supine BLE strengthening exercises on 2LPM with O2 sat 95-96%, no shortness of breath noted, and no pain complaints. Patient continues with wincing and guarding with supine to/from sit transfers due to pain complaints in low back. Patient left in bed with call bell in hand and bed alarm on and nurse notified of patient requesting pain medication. Patient will benefit from continued physical therapy in hospital and recommended venue below to increase strength, balance, endurance for safe ADLs and gait.    Follow Up Recommendations  SNF;Supervision/Assistance - 24 hour;Supervision for mobility/OOB     Equipment Recommendations  Rolling walker with 5" wheels;3in1 (PT)    Recommendations for Other Services       Precautions / Restrictions Precautions Precautions:  Fall Restrictions Weight Bearing Restrictions: No    Mobility  Bed Mobility Overal bed mobility: Needs Assistance Bed Mobility: Supine to Sit;Sit to Supine     Supine to sit: Mod assist Sit to supine: Min guard   General bed mobility comments: verbal and tactile cues for log roll and bedrail use with poor carryover, patient pulls on therapist  Transfers                 General transfer comment: patient refuses  Ambulation/Gait             General Gait Details: patient refuses   Stairs             Wheelchair Mobility    Modified Rankin (Stroke Patients Only)       Balance Overall balance assessment: Needs assistance Sitting-balance support: Feet supported;Bilateral upper extremity supported Sitting balance-Leahy Scale: Poor Sitting balance - Comments: seated edge of bed                                    Cognition Arousal/Alertness: Awake/alert Behavior During Therapy: Restless;Agitated;Impulsive Overall Cognitive Status: Within Functional Limits for tasks assessed                                 General Comments: encouragement to participate      Exercises General Exercises - Lower Extremity Ankle Circles/Pumps: Supine;AROM;Strengthening;Both;10 reps Quad Sets: Supine;AROM;Strengthening;Both;10 reps Short Arc Quad: Supine;AROM;Strengthening;Both;10 reps Heel Slides: Supine;AROM;Strengthening;Both;10 reps    General Comments        Pertinent  Vitals/Pain Pain Assessment: Faces Faces Pain Scale: Hurts a little bit Pain Location: low back Pain Descriptors / Indicators: Grimacing;Guarding;Moaning Pain Intervention(s): Limited activity within patient's tolerance;Monitored during session;Repositioned;Patient requesting pain meds-RN notified(Per RN, next pain medication scheduled at 1100)    Home Living                      Prior Function            PT Goals (current goals can now be found in the  care plan section) Acute Rehab PT Goals Patient Stated Goal: return home PT Goal Formulation: With patient Time For Goal Achievement: 07/04/18 Potential to Achieve Goals: Fair Progress towards PT goals: Progressing toward goals    Frequency    Min 2X/week      PT Plan Current plan remains appropriate    Co-evaluation              AM-PAC PT "6 Clicks" Mobility   Outcome Measure  Help needed turning from your back to your side while in a flat bed without using bedrails?: A Lot Help needed moving from lying on your back to sitting on the side of a flat bed without using bedrails?: A Lot Help needed moving to and from a bed to a chair (including a wheelchair)?: A Lot Help needed standing up from a chair using your arms (e.g., wheelchair or bedside chair)?: A Lot Help needed to walk in hospital room?: A Lot Help needed climbing 3-5 steps with a railing? : Total 6 Click Score: 11    End of Session   Activity Tolerance: Patient limited by fatigue;Patient limited by pain Patient left: in bed;with call bell/phone within reach;with bed alarm set Nurse Communication: Mobility status PT Visit Diagnosis: Other abnormalities of gait and mobility (R26.89);Muscle weakness (generalized) (M62.81);Unsteadiness on feet (R26.81)     Time: 7628-3151 PT Time Calculation (min) (ACUTE ONLY): 25 min  Charges:  $Therapeutic Exercise: 8-22 mins $Therapeutic Activity: 8-22 mins                     9:07 AM, 06/27/18 Talbot Grumbling, DPT Physical Therapist with [facility] [phone] office

## 2018-06-27 NOTE — Care Management Important Message (Signed)
Important Message  Patient Details  Name: Ashley Hall MRN: [id number] Date of Birth: 06/10/1945   Medicare Important Message Given:  Yes    Tommy Medal 06/27/2018, 10:48 AM

## 2018-06-27 NOTE — Clinical Social Work Placement (Signed)
   CLINICAL SOCIAL WORK PLACEMENT  NOTE  Date:  06/27/2018  Patient Details  Name: Ashley Hall MRN: [id number] Date of Birth: [birth year]  Clinical Social Work is seeking post-discharge placement for this patient at the [place] level of care (*CSW will initial, date and re-position this form in  chart as items are completed):  Yes   Patient/family provided with [facility] Work Department's list of facilities offering this level of care within the geographic area requested by the patient (or if unable, by the patient's family).  Yes   Patient/family informed of their freedom to choose among providers that offer the needed level of care, that participate in Medicare, Medicaid or managed care program needed by the patient, have an available bed and are willing to accept the patient.  Yes   Patient/family informed of [facility]'s ownership interest in [facility] and [facility], as well as of the fact that they are under no obligation to receive care at these facilities.  PASRR submitted to [employer] on 06/21/18     PASRR number received on 06/26/18     Existing PASRR number confirmed on       FL2 transmitted to all facilities in geographic area requested by pt/family on 06/21/18     FL2 transmitted to all facilities within larger geographic area on       Patient informed that his/her managed care company has contracts with or will negotiate with certain facilities, including the following:        Yes   Patient/family informed of bed offers received.  Patient chooses bed at [facility]     Physician recommends and patient chooses bed at      Patient to be transferred to [facility] on 06/27/18.  Patient to be transferred to facility by [facility]     Patient family notified on 06/27/18 of transfer.  Name of family member notified:  Joslyn Devon, niece     PHYSICIAN       Additional Comment:  Discharge clinicals sent. LCSW signing off.    _______________________________________________ Ambrose Pancoast D, LCSW 06/27/2018, 11:11 AM

## 2018-06-27 NOTE — Progress Notes (Signed)
Report called to [place]. EMS present to transport.

## 2018-06-27 NOTE — Discharge Summary (Signed)
Physician Discharge Summary  Ashley Hall:[id number] DOB: 08-Aug-1945 DOA: 06/19/2018  PCP: Lucia Gaskins, MD  Admit date: 06/19/2018  Discharge date: 06/27/2018  Admitted From:Home  Disposition:  SNF  Recommendations for Outpatient Follow-up:  1. Follow up with PCP in 1-2 weeks 2. Continue on medications as prescribed  Home Health: None  Equipment/Devices: Rolling walker  Discharge Condition: Stable  CODE STATUS: Full  Diet recommendation: Heart Healthy  Brief/Interim Summary: Per HPI: 73 y.o.femalewith medical history significant ofGERD, alcohol abuse, arthritis, bipolar 1 disorder, chronic back pain, COPD, depression, hypertension, seizures presenting to the hospital via EMS for evaluation ofAMS.Per triage note, caregiver reported that patient fell yesterday.She hada bottle of Norco at home EMS thinks she may be taking more pills than what is prescribed. Norco was filled 06/14/2018, #120 and only 90 pills were present in the bottle. Patient was somnolent on arrival but became more awake and alert after receiving a dose of Narcan.  Patient AAO x3 butveryslow to respond to questions. It was difficult to obtain a thorough history from her. Reports taking Norco 2 tablets every 4 hours normally but thinks she might have taken 4 tablets this morning. Reports taking Tylenol but is not sure how much. States she falls at home all the time and yesterday again lost her balance and fell. Denies loss of consciousness. Does report having substernal chest pain at that time and dyspnea. Denies having any associated diaphoresis or nausea. Reports having fevers, chills, and a cough.   Patient was admitted with acute encephalopathy in the setting of multiple electrolyte derangements as well as nonintentional narcotics/Tylenol overdose.  She also had a component of hospital associated delirium as well.  Additionally, she had some concerns of aspiration with left lung  pneumonia noted as well.  She has 2 more days to complete on Augmentin to finish the course of treatment for pneumonia, overall her encephalopathy is greatly improved and she has been seen by PT/OT with recommendations for inpatient rehabilitation on discharge.  She is recommended to remain on rolling walker and has not had any other acute events during the course of this admission.  She continues to complain of ongoing pain which is chronic for her and she has been started back on her usual home medications to include gabapentin today.  She needs careful monitoring of her pain medication use while undergoing rehabilitation.  Discharge Diagnoses:  Principal Problem:   Acute encephalopathy Active Problems:   CAP (community acquired pneumonia)   Opioid overdose ([place])   Acetaminophen toxicity   Fall at home, initial encounter   Chest pain   Hypokalemia   Hypomagnesemia   Hypercalcemia   Malnutrition of moderate degree  Principal discharge diagnosis: Acute encephalopathy multifactorial with aspiration pneumonia.  Discharge Instructions  Discharge Instructions    Diet - low sodium heart healthy   Complete by:  As directed    Increase activity slowly   Complete by:  As directed      Allergies as of 06/27/2018      Reactions   Pregabalin    REACTION: "couldn't move"      Medication List    STOP taking these medications   amLODipine 5 MG tablet Commonly known as:  NORVASC   magnesium 30 MG tablet   traZODone 150 MG tablet Commonly known as:  DESYREL     TAKE these medications   amoxicillin-clavulanate 875-125 MG tablet Commonly known as:  AUGMENTIN Take 1 tablet by mouth every 12 (twelve) hours for 2 days.  Aspirin-Salicylamide-Caffeine 161-096-04.5 MG Pack Take 1 Package by mouth as needed.   busPIRone 5 MG tablet Commonly known as:  BUSPAR Take 1 tablet (5 mg total) by mouth 2 (two) times daily for 30 days.   diclofenac 1.3 % Ptch Commonly known as:  FLECTOR Place  1 patch onto the skin 2 (two) times daily for 3 days.   feeding supplement (ENSURE ENLIVE) Liqd Take 237 mLs by mouth 2 (two) times daily between meals.   ferrous gluconate 324 MG tablet Commonly known as:  FERGON Take 1 tablet (324 mg total) by mouth daily with breakfast for 30 days.   fish oil-omega-3 fatty acids 1000 MG capsule Take 2 g by mouth daily.   folic acid 1 MG tablet Commonly known as:  FOLVITE Take 1 tablet (1 mg total) by mouth daily for 30 days.   gabapentin 100 MG capsule Commonly known as:  NEURONTIN Take 2 capsules (200 mg total) by mouth 3 (three) times daily for 30 days. What changed:  You were already taking a medication with the same name, and this prescription was added. Make sure you understand how and when to take each.   gabapentin 300 MG capsule Commonly known as:  NEURONTIN Take 1 capsule (300 mg total) by mouth 3 (three) times daily for 30 days. What changed:  Another medication with the same name was added. Make sure you understand how and when to take each.   HYDROcodone-acetaminophen 5-325 MG tablet Commonly known as:  NORCO/VICODIN Take 1 tablet by mouth every 6 (six) hours as needed for up to 3 days for moderate pain or severe pain.   levothyroxine 50 MCG tablet Commonly known as:  SYNTHROID, LEVOTHROID Take 50 mcg by mouth every morning.   metoprolol tartrate 25 MG tablet Commonly known as:  LOPRESSOR Take 25 mg by mouth 2 (two) times daily.   multivitamin capsule Take 1 capsule by mouth daily.   nitroGLYCERIN 0.4 MG SL tablet Commonly known as:  NITROSTAT Place 0.4 mg under the tongue every 5 (five) minutes as needed for chest pain.   oxybutynin 5 MG tablet Commonly known as:  DITROPAN Take 5 mg by mouth daily.   pantoprazole 40 MG tablet Commonly known as:  PROTONIX TAKE ONE TABLET BY MOUTH TWICE DAILY. TAKE BEFORE A MEAL. (MORNING ,EVENING) What changed:  See the new instructions.   PARoxetine 20 MG tablet Commonly known  as:  PAXIL Take 1 tablet by mouth every morning. What changed:  Another medication with the same name was added. Make sure you understand how and when to take each.   PARoxetine 20 MG tablet Commonly known as:  PAXIL Take 1 tablet (20 mg total) by mouth every morning. What changed:  You were already taking a medication with the same name, and this prescription was added. Make sure you understand how and when to take each.   pravastatin 40 MG tablet Commonly known as:  PRAVACHOL Take 1 tablet (40 mg total) by mouth at bedtime. What changed:  when to take this   PROAIR HFA 108 (90 Base) MCG/ACT inhaler Generic drug:  albuterol Inhale 2 puffs into the lungs 4 (four) times daily as needed for wheezing or shortness of breath.   SUPER B COMPLEX PO Take 1 tablet by mouth daily.   topiramate 100 MG tablet Commonly known as:  TOPAMAX Take 1 tablet (100 mg total) by mouth daily. What changed:  additional instructions   vitamin A 10000 UNIT capsule Take 8,000 Units by  mouth daily.   vitamin C 1000 MG tablet Take 1,000 mg by mouth daily.   vitamin E 200 UNIT capsule Take 400 Units by mouth daily.      Contact information for after-discharge care    Destination    [facility] .   Service:  Skilled Nursing Contact information: [place] [phone]             Allergies  Allergen Reactions  . Pregabalin     REACTION: "couldn't move"    Consultations:  None   Procedures/Studies: Dg Chest 1 View  Result Date: 06/19/2018 CLINICAL DATA:  Fall yesterday with right-sided pain, initial encounter EXAM: CHEST  1 VIEW COMPARISON:  12/20/2017 FINDINGS: Cardiac shadow is within normal limits. Aortic calcifications are seen. The lungs are well aerated bilaterally. Patchy infiltrative changes are noted within left lung. No sizable effusion is seen. No acute bony abnormality is noted. Old rib fractures on the left are seen.  IMPRESSION: Patchy left lung infiltrate. Electronically Signed   By: Inez Catalina M.D.   On: 06/19/2018 15:50   Dg Lumbar Spine 2-3 Views  Result Date: 06/19/2018 CLINICAL DATA:  Low back pain.  Fall yesterday. EXAM: LUMBAR SPINE - 2-3 VIEW COMPARISON:  CT scan December 11, 2017 FINDINGS: No fracture or traumatic malalignment. Minimal degenerative disc disease. Lower lumbar facet degenerative changes are noted. Calcified atherosclerosis of the abdominal aorta. IMPRESSION: Degenerative changes.  No acute fracture or malalignment noted. Electronically Signed   By: Dorise Bullion III M.D   On: 06/19/2018 18:24   Ct Head Wo Contrast  Result Date: 06/19/2018 CLINICAL DATA:  Fall, subacute head trauma, headache and neck pain EXAM: CT HEAD WITHOUT CONTRAST CT CERVICAL SPINE WITHOUT CONTRAST TECHNIQUE: Multidetector CT imaging of the head and cervical spine was performed following the standard protocol without intravenous contrast. Multiplanar CT image reconstructions of the cervical spine were also generated. COMPARISON:  03/18/2018, 04/01/2017 FINDINGS: CT HEAD FINDINGS Brain: Stable atrophy pattern and minor white matter microvascular changes. No acute intracranial hemorrhage, definite new infarction, midline shift, herniation, hydrocephalus, or extra-axial fluid collection. No focal mass effect or edema. Cisterns are patent. Cerebellar atrophy as well. Vascular: Intracranial atherosclerosis. No hyperdense vessel. Skull: Normal. Negative for fracture or focal lesion. Sinuses/Orbits: No acute finding. Other: None. CT CERVICAL SPINE FINDINGS Alignment: No malalignment. Facets are aligned. No subluxation or dislocation. Previous fusion at C6-7 Skull base and vertebrae: No acute fracture. No primary bone lesion or focal pathologic process. Soft tissues and spinal canal: No prevertebral fluid or swelling. No visible canal hematoma. Disc levels: Multilevel cervical degenerative spondylosis throughout the entire cervical  spine and posterior facet arthropathy. these changes are most severe at C5-6 and C7-T1 with marked disc space narrowing, sclerosis osteophyte template formation. Degenerative changes of the C1-2 articulation as well. Upper chest: Left upper lobe septal thickening mild patchy airspace opacity concerning for component asymmetric edema. Difficult to exclude pneumonia. Coronary atherosclerosis noted. Other: none IMPRESSION: Stable noncontrast head CT without interval change or acute finding. Stable cervical spine degenerative spondylosis as detailed without acute osseous finding or malalignment Left upper lobe interlobular septal thickening and mild patchy airspace process concerning for asymmetric edema and/or pneumonia. Electronically Signed   By: Jerilynn Mages.  Shick M.D.   On: 06/19/2018 16:20   Ct Cervical Spine Wo Contrast  Result Date: 06/19/2018 CLINICAL DATA:  Fall, subacute head trauma, headache and neck pain EXAM: CT HEAD WITHOUT CONTRAST CT CERVICAL SPINE WITHOUT CONTRAST  TECHNIQUE: Multidetector CT imaging of the head and cervical spine was performed following the standard protocol without intravenous contrast. Multiplanar CT image reconstructions of the cervical spine were also generated. COMPARISON:  03/18/2018, 04/01/2017 FINDINGS: CT HEAD FINDINGS Brain: Stable atrophy pattern and minor white matter microvascular changes. No acute intracranial hemorrhage, definite new infarction, midline shift, herniation, hydrocephalus, or extra-axial fluid collection. No focal mass effect or edema. Cisterns are patent. Cerebellar atrophy as well. Vascular: Intracranial atherosclerosis. No hyperdense vessel. Skull: Normal. Negative for fracture or focal lesion. Sinuses/Orbits: No acute finding. Other: None. CT CERVICAL SPINE FINDINGS Alignment: No malalignment. Facets are aligned. No subluxation or dislocation. Previous fusion at C6-7 Skull base and vertebrae: No acute fracture. No primary bone lesion or focal pathologic  process. Soft tissues and spinal canal: No prevertebral fluid or swelling. No visible canal hematoma. Disc levels: Multilevel cervical degenerative spondylosis throughout the entire cervical spine and posterior facet arthropathy. these changes are most severe at C5-6 and C7-T1 with marked disc space narrowing, sclerosis osteophyte template formation. Degenerative changes of the C1-2 articulation as well. Upper chest: Left upper lobe septal thickening mild patchy airspace opacity concerning for component asymmetric edema. Difficult to exclude pneumonia. Coronary atherosclerosis noted. Other: none IMPRESSION: Stable noncontrast head CT without interval change or acute finding. Stable cervical spine degenerative spondylosis as detailed without acute osseous finding or malalignment Left upper lobe interlobular septal thickening and mild patchy airspace process concerning for asymmetric edema and/or pneumonia. Electronically Signed   By: Jerilynn Mages.  Shick M.D.   On: 06/19/2018 16:20   Dg Hand Complete Right  Result Date: 06/19/2018 CLINICAL DATA:  Right hand pain since falling yesterday. EXAM: RIGHT HAND - COMPLETE 3+ VIEW COMPARISON:  Wrist radiographs 05/24/2018. FINDINGS: Patient unable to remove the ring from the right index finger. The bones are demineralized. No evidence of acute fracture or dislocation. There are minimal interphalangeal degenerative changes. No focal soft tissue swelling or foreign body identified. IMPRESSION: No evidence of acute right hand injury. Electronically Signed   By: Richardean Sale M.D.   On: 06/19/2018 15:50   Dg Hip Unilat With Pelvis 2-3 Views Right  Result Date: 06/19/2018 CLINICAL DATA:  Fall yesterday.  Right hip pain. EXAM: DG HIP (WITH OR WITHOUT PELVIS) 2-3V RIGHT COMPARISON:  Radiographs 12/20/2017. FINDINGS: The bones are diffusely demineralized. No evidence of acute fracture, dislocation or femoral head avascular necrosis. There are stable minimal degenerative changes  involving the hips and sacroiliac joints bilaterally. IMPRESSION: Stable examination without evidence of acute osseous injury. If the patient has persistent hip pain or inability to bear weight, follow up imaging may be warranted as acute hip fractures can be radiographically occult in the elderly. Electronically Signed   By: Richardean Sale M.D.   On: 06/19/2018 15:48     Discharge Exam: Vitals:   06/27/18 0435 06/27/18 0910  BP: (!) 146/60 (!) 178/87  Pulse: 62 89  Resp: 17 17  Temp: 98.8 F (37.1 C) 98.4 F (36.9 C)  SpO2: 100% 98%   Vitals:   06/26/18 1600 06/26/18 2346 06/27/18 0435 06/27/18 0910  BP: (!) 150/76 (!) 160/73 (!) 146/60 (!) 178/87  Pulse: 77 73 62 89  Resp:  20 17 17   Temp: 99 F (37.2 C) 98.7 F (37.1 C) 98.8 F (37.1 C) 98.4 F (36.9 C)  TempSrc: Oral Oral Oral Oral  SpO2: 98% 100% 100% 98%  Weight:      Height:        General: Pt is alert,  awake, not in acute distress Cardiovascular: RRR, S1/S2 +, no rubs, no gallops Respiratory: CTA bilaterally, no wheezing, no rhonchi Abdominal: Soft, NT, ND, bowel sounds + Extremities: no edema, no cyanosis    The results of significant diagnostics from this hospitalization (including imaging, microbiology, ancillary and laboratory) are listed below for reference.     Microbiology: No results found for this or any previous visit (from the past 240 hour(s)).   Labs: BNP (last 3 results) No results for input(s): BNP in the last 8760 hours. Basic Metabolic Panel: Recent Labs  Lab 06/21/18 1029 06/22/18 0707 06/23/18 0646 06/24/18 0522 06/27/18 0521  NA 139 142 139 136 138  K 3.0* 2.9* 3.6 3.6 3.0*  CL 106 109 110 106 107  CO2 24 26 23 23 25   GLUCOSE 155* 112* 90 96 96  BUN 6* 8 6* 6* 6*  CREATININE 0.72 0.66 0.67 0.63 0.59  CALCIUM 11.4* 11.1* 9.9 9.6 8.4*  MG  --   --  1.9  --   --    Liver Function Tests: Recent Labs  Lab 06/20/18 1614  AST 36  ALT 15  ALKPHOS 83  BILITOT 0.9  PROT 5.6*   ALBUMIN 2.4*   No results for input(s): LIPASE, AMYLASE in the last 168 hours. No results for input(s): AMMONIA in the last 168 hours. CBC: Recent Labs  Lab 06/24/18 0522 06/27/18 0521  WBC 9.6 9.0  HGB 10.3* 9.9*  HCT 33.1* 31.2*  MCV 101.5* 100.6*  PLT 156 224   Cardiac Enzymes: No results for input(s): CKTOTAL, CKMB, CKMBINDEX, TROPONINI in the last 168 hours. BNP: Invalid input(s): POCBNP CBG: No results for input(s): GLUCAP in the last 168 hours. D-Dimer No results for input(s): DDIMER in the last 72 hours. Hgb A1c No results for input(s): HGBA1C in the last 72 hours. Lipid Profile No results for input(s): CHOL, HDL, LDLCALC, TRIG, CHOLHDL, LDLDIRECT in the last 72 hours. Thyroid function studies No results for input(s): TSH, T4TOTAL, T3FREE, THYROIDAB in the last 72 hours.  Invalid input(s): FREET3 Anemia work up No results for input(s): VITAMINB12, FOLATE, FERRITIN, TIBC, IRON, RETICCTPCT in the last 72 hours. Urinalysis    Component Value Date/Time   COLORURINE STRAW (A) 04/01/2017 0142   APPEARANCEUR CLEAR 04/01/2017 0142   LABSPEC 1.005 04/01/2017 0142   PHURINE 6.0 04/01/2017 0142   GLUCOSEU NEGATIVE 04/01/2017 0142   HGBUR NEGATIVE 04/01/2017 0142   BILIRUBINUR NEGATIVE 04/01/2017 0142   KETONESUR NEGATIVE 04/01/2017 0142   PROTEINUR NEGATIVE 04/01/2017 0142   UROBILINOGEN 0.2 05/06/2014 1225   NITRITE NEGATIVE 04/01/2017 0142   LEUKOCYTESUR NEGATIVE 04/01/2017 0142   Sepsis Labs Invalid input(s): PROCALCITONIN,  WBC,  LACTICIDVEN Microbiology No results found for this or any previous visit (from the past 240 hour(s)).   Time coordinating discharge: 35 minutes  SIGNED:   Rodena Goldmann, DO [facility] 06/27/2018, 9:43 AM Pager [phone]  If 7PM-7AM, please contact night-coverage [email or web address] Password [email or web address]

## 2018-07-23 ENCOUNTER — Other Ambulatory Visit: Payer: Self-pay

## 2018-07-23 ENCOUNTER — Encounter (HOSPITAL_COMMUNITY): Payer: Self-pay | Admitting: Emergency Medicine

## 2018-07-23 ENCOUNTER — Inpatient Hospital Stay (HOSPITAL_COMMUNITY)
Admission: EM | Admit: 2018-07-23 | Discharge: 2018-07-31 | DRG: 190 | Disposition: A | Discharge status: Hospice | Payer: Medicare Other | Attending: Family Medicine | Admitting: Family Medicine

## 2018-07-23 ENCOUNTER — Emergency Department (HOSPITAL_COMMUNITY): Payer: Medicare Other

## 2018-07-23 DIAGNOSIS — Z90710 Acquired absence of both cervix and uterus: Secondary | ICD-10-CM

## 2018-07-23 DIAGNOSIS — E538 Deficiency of other specified B group vitamins: Secondary | ICD-10-CM | POA: Diagnosis present

## 2018-07-23 DIAGNOSIS — J440 Chronic obstructive pulmonary disease with acute lower respiratory infection: Principal | ICD-10-CM | POA: Diagnosis present

## 2018-07-23 DIAGNOSIS — R0902 Hypoxemia: Secondary | ICD-10-CM | POA: Diagnosis not present

## 2018-07-23 DIAGNOSIS — E785 Hyperlipidemia, unspecified: Secondary | ICD-10-CM | POA: Diagnosis present

## 2018-07-23 DIAGNOSIS — G9349 Other encephalopathy: Secondary | ICD-10-CM | POA: Diagnosis present

## 2018-07-23 DIAGNOSIS — D630 Anemia in neoplastic disease: Secondary | ICD-10-CM | POA: Diagnosis present

## 2018-07-23 DIAGNOSIS — E876 Hypokalemia: Secondary | ICD-10-CM | POA: Diagnosis present

## 2018-07-23 DIAGNOSIS — J9601 Acute respiratory failure with hypoxia: Secondary | ICD-10-CM | POA: Diagnosis present

## 2018-07-23 DIAGNOSIS — Z515 Encounter for palliative care: Secondary | ICD-10-CM | POA: Diagnosis present

## 2018-07-23 DIAGNOSIS — Z720 Tobacco use: Secondary | ICD-10-CM | POA: Diagnosis present

## 2018-07-23 DIAGNOSIS — G40909 Epilepsy, unspecified, not intractable, without status epilepticus: Secondary | ICD-10-CM | POA: Diagnosis present

## 2018-07-23 DIAGNOSIS — E039 Hypothyroidism, unspecified: Secondary | ICD-10-CM | POA: Diagnosis present

## 2018-07-23 DIAGNOSIS — Z66 Do not resuscitate: Secondary | ICD-10-CM | POA: Diagnosis not present

## 2018-07-23 DIAGNOSIS — D649 Anemia, unspecified: Secondary | ICD-10-CM | POA: Diagnosis not present

## 2018-07-23 DIAGNOSIS — C3490 Malignant neoplasm of unspecified part of unspecified bronchus or lung: Secondary | ICD-10-CM | POA: Diagnosis present

## 2018-07-23 DIAGNOSIS — I10 Essential (primary) hypertension: Secondary | ICD-10-CM | POA: Diagnosis present

## 2018-07-23 DIAGNOSIS — Z7189 Other specified counseling: Secondary | ICD-10-CM

## 2018-07-23 DIAGNOSIS — Y95 Nosocomial condition: Secondary | ICD-10-CM | POA: Diagnosis present

## 2018-07-23 DIAGNOSIS — K5909 Other constipation: Secondary | ICD-10-CM | POA: Diagnosis present

## 2018-07-23 DIAGNOSIS — M1990 Unspecified osteoarthritis, unspecified site: Secondary | ICD-10-CM | POA: Diagnosis present

## 2018-07-23 DIAGNOSIS — C799 Secondary malignant neoplasm of unspecified site: Secondary | ICD-10-CM | POA: Diagnosis present

## 2018-07-23 DIAGNOSIS — F609 Personality disorder, unspecified: Secondary | ICD-10-CM | POA: Diagnosis present

## 2018-07-23 DIAGNOSIS — Z79899 Other long term (current) drug therapy: Secondary | ICD-10-CM

## 2018-07-23 DIAGNOSIS — K219 Gastro-esophageal reflux disease without esophagitis: Secondary | ICD-10-CM | POA: Diagnosis present

## 2018-07-23 DIAGNOSIS — J441 Chronic obstructive pulmonary disease with (acute) exacerbation: Secondary | ICD-10-CM | POA: Diagnosis present

## 2018-07-23 DIAGNOSIS — Z79890 Hormone replacement therapy (postmenopausal): Secondary | ICD-10-CM

## 2018-07-23 DIAGNOSIS — J189 Pneumonia, unspecified organism: Secondary | ICD-10-CM | POA: Diagnosis present

## 2018-07-23 DIAGNOSIS — F1011 Alcohol abuse, in remission: Secondary | ICD-10-CM | POA: Diagnosis present

## 2018-07-23 DIAGNOSIS — R358 Other polyuria: Secondary | ICD-10-CM | POA: Diagnosis present

## 2018-07-23 DIAGNOSIS — G894 Chronic pain syndrome: Secondary | ICD-10-CM | POA: Diagnosis present

## 2018-07-23 DIAGNOSIS — R0602 Shortness of breath: Secondary | ICD-10-CM | POA: Diagnosis not present

## 2018-07-23 DIAGNOSIS — F319 Bipolar disorder, unspecified: Secondary | ICD-10-CM | POA: Diagnosis present

## 2018-07-23 DIAGNOSIS — M419 Scoliosis, unspecified: Secondary | ICD-10-CM | POA: Diagnosis present

## 2018-07-23 DIAGNOSIS — R4182 Altered mental status, unspecified: Secondary | ICD-10-CM | POA: Diagnosis present

## 2018-07-23 DIAGNOSIS — F17210 Nicotine dependence, cigarettes, uncomplicated: Secondary | ICD-10-CM | POA: Diagnosis present

## 2018-07-23 DIAGNOSIS — M549 Dorsalgia, unspecified: Secondary | ICD-10-CM | POA: Diagnosis present

## 2018-07-23 DIAGNOSIS — Z888 Allergy status to other drugs, medicaments and biological substances status: Secondary | ICD-10-CM

## 2018-07-23 DIAGNOSIS — A0472 Enterocolitis due to Clostridium difficile, not specified as recurrent: Secondary | ICD-10-CM | POA: Diagnosis present

## 2018-07-23 DIAGNOSIS — R410 Disorientation, unspecified: Secondary | ICD-10-CM | POA: Diagnosis not present

## 2018-07-23 DIAGNOSIS — Z809 Family history of malignant neoplasm, unspecified: Secondary | ICD-10-CM

## 2018-07-23 DIAGNOSIS — R918 Other nonspecific abnormal finding of lung field: Secondary | ICD-10-CM | POA: Diagnosis not present

## 2018-07-23 DIAGNOSIS — Z818 Family history of other mental and behavioral disorders: Secondary | ICD-10-CM

## 2018-07-23 DIAGNOSIS — Z8249 Family history of ischemic heart disease and other diseases of the circulatory system: Secondary | ICD-10-CM

## 2018-07-23 HISTORY — DX: Deficiency of other specified B group vitamins: E53.8

## 2018-07-23 HISTORY — DX: Tobacco use: Z72.0

## 2018-07-23 HISTORY — DX: Hyperlipidemia, unspecified: E78.5

## 2018-07-23 LAB — BASIC METABOLIC PANEL
Anion gap: 7 (ref 5–15)
BUN: 15 mg/dL (ref 8–23)
CO2: 23 mmol/L (ref 22–32)
Calcium: 12.6 mg/dL — ABNORMAL HIGH (ref 8.9–10.3)
Chloride: 105 mmol/L (ref 98–111)
Creatinine, Ser: 1.01 mg/dL — ABNORMAL HIGH (ref 0.44–1.00)
GFR calc Af Amer: >60 mL/min (ref >60)
GFR calc non Af Amer: 56 mL/min — ABNORMAL LOW (ref >60)
Glucose, Bld: 100 mg/dL — ABNORMAL HIGH (ref 70–99)
Potassium: 4.2 mmol/L (ref 3.5–5.1)
SODIUM: 135 mmol/L (ref 135–145)

## 2018-07-23 LAB — HEPATIC FUNCTION PANEL
ALT: 11 U/L (ref 0–44)
AST: 42 U/L — ABNORMAL HIGH (ref 15–41)
Albumin: 3 g/dL — ABNORMAL LOW (ref 3.5–5.0)
Alkaline Phosphatase: 116 U/L (ref 38–126)
Bilirubin, Direct: 0.1 mg/dL (ref 0.0–0.2)
Indirect Bilirubin: 0.1 mg/dL — ABNORMAL LOW (ref 0.3–0.9)
Total Bilirubin: 0.2 mg/dL — ABNORMAL LOW (ref 0.3–1.2)
Total Protein: 6.8 g/dL (ref 6.5–8.1)

## 2018-07-23 LAB — CBC WITH DIFFERENTIAL/PLATELET
Abs Immature Granulocytes: 0.04 10*3/uL (ref 0.00–0.07)
Basophils Absolute: 0 10*3/uL (ref 0.0–0.1)
Basophils Relative: 0 %
Eosinophils Absolute: 0 10*3/uL (ref 0.0–0.5)
Eosinophils Relative: 0 %
HCT: 34.7 % — ABNORMAL LOW (ref 36.0–46.0)
Hemoglobin: 10 g/dL — ABNORMAL LOW (ref 12.0–15.0)
Immature Granulocytes: 0 %
Lymphocytes Relative: 10 %
Lymphs Abs: 0.9 10*3/uL (ref 0.7–4.0)
MCH: 28.7 pg (ref 26.0–34.0)
MCHC: 28.8 g/dL — ABNORMAL LOW (ref 30.0–36.0)
MCV: 99.4 fL (ref 80.0–100.0)
Monocytes Absolute: 0.6 10*3/uL (ref 0.1–1.0)
Monocytes Relative: 6 %
Neutro Abs: 7.7 10*3/uL (ref 1.7–7.7)
Neutrophils Relative %: 84 %
Platelets: 184 10*3/uL (ref 150–400)
RBC: 3.49 MIL/uL — AB (ref 3.87–5.11)
RDW: 13.2 % (ref 11.5–15.5)
WBC: 9.2 10*3/uL (ref 4.0–10.5)
nRBC: 0 % (ref 0.0–0.2)

## 2018-07-23 LAB — BLOOD GAS, VENOUS
Acid-Base Excess: 1.5 mmol/L (ref 0.0–2.0)
BICARBONATE: 24.6 mmol/L (ref 20.0–28.0)
FIO2: 28
O2 Saturation: 69.3 %
Patient temperature: 36.8
pCO2, Ven: 53.8 mmHg (ref 44.0–60.0)
pH, Ven: 7.32 (ref 7.250–7.430)
pO2, Ven: 40.7 mmHg (ref 32.0–45.0)

## 2018-07-23 LAB — MAGNESIUM: MAGNESIUM: 1.9 mg/dL (ref 1.7–2.4)

## 2018-07-23 LAB — PHOSPHORUS: Phosphorus: 2.7 mg/dL (ref 2.5–4.6)

## 2018-07-23 MED ORDER — IPRATROPIUM-ALBUTEROL 0.5-2.5 (3) MG/3ML IN SOLN
3.0000 mL | Freq: Four times a day (QID) | RESPIRATORY_TRACT | Status: DC
  Administered 2018-07-23 – 2018-07-28 (×15): 3 mL via RESPIRATORY_TRACT
  Filled 2018-07-23 (×17): qty 3

## 2018-07-23 MED ORDER — SODIUM CHLORIDE 0.9 % IV SOLN
1.0000 g | Freq: Once | INTRAVENOUS | Status: AC
  Administered 2018-07-23: 1 g via INTRAVENOUS
  Filled 2018-07-23: qty 10

## 2018-07-23 MED ORDER — METHYLPREDNISOLONE SODIUM SUCC 40 MG IJ SOLR
40.0000 mg | Freq: Once | INTRAMUSCULAR | Status: AC
  Administered 2018-07-23: 40 mg via INTRAVENOUS
  Filled 2018-07-23: qty 1

## 2018-07-23 MED ORDER — LEVOTHYROXINE SODIUM 50 MCG PO TABS
50.0000 ug | ORAL_TABLET | Freq: Every morning | ORAL | Status: DC
  Administered 2018-07-24 – 2018-07-31 (×8): 50 ug via ORAL
  Filled 2018-07-23 (×8): qty 1

## 2018-07-23 MED ORDER — METOPROLOL TARTRATE 25 MG PO TABS
25.0000 mg | ORAL_TABLET | Freq: Two times a day (BID) | ORAL | Status: DC
  Administered 2018-07-24 – 2018-07-31 (×15): 25 mg via ORAL
  Filled 2018-07-23 (×15): qty 1

## 2018-07-23 MED ORDER — NITROGLYCERIN 0.4 MG SL SUBL: 0.4000 mg | SUBLINGUAL_TABLET | SUBLINGUAL | Status: DC | PRN

## 2018-07-23 MED ORDER — ACETAMINOPHEN 650 MG RE SUPP: 650.0000 mg | Freq: Four times a day (QID) | RECTAL | Status: DC | PRN

## 2018-07-23 MED ORDER — TRAZODONE HCL 50 MG PO TABS
50.0000 mg | ORAL_TABLET | Freq: Every day | ORAL | Status: DC
  Administered 2018-07-24 – 2018-07-30 (×7): 50 mg via ORAL
  Filled 2018-07-23 (×7): qty 1

## 2018-07-23 MED ORDER — PAROXETINE HCL 20 MG PO TABS
20.0000 mg | ORAL_TABLET | Freq: Every morning | ORAL | Status: DC
  Administered 2018-07-24 – 2018-07-31 (×8): 20 mg via ORAL
  Filled 2018-07-23 (×8): qty 1

## 2018-07-23 MED ORDER — PANTOPRAZOLE SODIUM 40 MG PO TBEC
40.0000 mg | DELAYED_RELEASE_TABLET | Freq: Two times a day (BID) | ORAL | Status: DC
  Administered 2018-07-24 – 2018-07-31 (×15): 40 mg via ORAL
  Filled 2018-07-23 (×15): qty 1

## 2018-07-23 MED ORDER — MAGNESIUM SULFATE 2 GM/50ML IV SOLN
2.0000 g | Freq: Once | INTRAVENOUS | Status: AC
  Administered 2018-07-23: 2 g via INTRAVENOUS
  Filled 2018-07-23: qty 50

## 2018-07-23 MED ORDER — KETOROLAC TROMETHAMINE 15 MG/ML IJ SOLN
15.0000 mg | Freq: Once | INTRAMUSCULAR | Status: AC
  Administered 2018-07-23: 15 mg via INTRAVENOUS
  Filled 2018-07-23: qty 1

## 2018-07-23 MED ORDER — PRAVASTATIN SODIUM 40 MG PO TABS
40.0000 mg | ORAL_TABLET | Freq: Every morning | ORAL | Status: DC
  Administered 2018-07-24 – 2018-07-30 (×7): 40 mg via ORAL
  Filled 2018-07-23 (×7): qty 1

## 2018-07-23 MED ORDER — SODIUM CHLORIDE 0.9 % IV SOLN
INTRAVENOUS | Status: DC
  Administered 2018-07-23 – 2018-07-28 (×10): via INTRAVENOUS

## 2018-07-23 MED ORDER — ACETAMINOPHEN 325 MG PO TABS
650.0000 mg | ORAL_TABLET | Freq: Once | ORAL | Status: AC
  Administered 2018-07-23: 650 mg via ORAL
  Filled 2018-07-23: qty 2

## 2018-07-23 MED ORDER — GABAPENTIN 100 MG PO CAPS
200.0000 mg | ORAL_CAPSULE | Freq: Three times a day (TID) | ORAL | Status: DC
  Administered 2018-07-24 – 2018-07-31 (×22): 200 mg via ORAL
  Filled 2018-07-23 (×23): qty 2

## 2018-07-23 MED ORDER — SODIUM CHLORIDE 0.9 % IV SOLN
500.0000 mg | INTRAVENOUS | Status: AC
  Administered 2018-07-24 – 2018-07-29 (×6): 500 mg via INTRAVENOUS
  Filled 2018-07-23 (×6): qty 500

## 2018-07-23 MED ORDER — BUSPIRONE HCL 5 MG PO TABS
5.0000 mg | ORAL_TABLET | Freq: Two times a day (BID) | ORAL | Status: DC
  Administered 2018-07-24 – 2018-07-31 (×15): 5 mg via ORAL
  Filled 2018-07-23 (×15): qty 1

## 2018-07-23 MED ORDER — FOLIC ACID 1 MG PO TABS
1.0000 mg | ORAL_TABLET | Freq: Every day | ORAL | Status: DC
  Administered 2018-07-24 – 2018-07-30 (×7): 1 mg via ORAL
  Filled 2018-07-23 (×7): qty 1

## 2018-07-23 MED ORDER — SODIUM CHLORIDE 0.9 % IV SOLN
1.0000 g | INTRAVENOUS | Status: DC
  Filled 2018-07-23: qty 10

## 2018-07-23 MED ORDER — SODIUM CHLORIDE 0.9 % IV SOLN
INTRAVENOUS | Status: DC | PRN
  Administered 2018-07-23: 500 mL via INTRAVENOUS

## 2018-07-23 MED ORDER — OXYBUTYNIN CHLORIDE 5 MG PO TABS
5.0000 mg | ORAL_TABLET | Freq: Every day | ORAL | Status: DC
  Administered 2018-07-24 – 2018-07-31 (×8): 5 mg via ORAL
  Filled 2018-07-23 (×8): qty 1

## 2018-07-23 MED ORDER — FERROUS GLUCONATE 324 (38 FE) MG PO TABS
324.0000 mg | ORAL_TABLET | Freq: Every day | ORAL | Status: DC
  Administered 2018-07-24 – 2018-07-30 (×7): 324 mg via ORAL
  Filled 2018-07-23 (×8): qty 1

## 2018-07-23 MED ORDER — ENOXAPARIN SODIUM 40 MG/0.4ML ~~LOC~~ SOLN
40.0000 mg | SUBCUTANEOUS | Status: DC
  Administered 2018-07-23 – 2018-07-29 (×7): 40 mg via SUBCUTANEOUS
  Filled 2018-07-23 (×7): qty 0.4

## 2018-07-23 MED ORDER — ACETAMINOPHEN 325 MG PO TABS
650.0000 mg | ORAL_TABLET | Freq: Four times a day (QID) | ORAL | Status: DC | PRN
  Administered 2018-07-24 (×2): 650 mg via ORAL
  Filled 2018-07-23 (×2): qty 2

## 2018-07-23 MED ORDER — SODIUM CHLORIDE 0.9 % IV BOLUS
1000.0000 mL | Freq: Once | INTRAVENOUS | Status: AC
  Administered 2018-07-23: 1000 mL via INTRAVENOUS

## 2018-07-23 MED ORDER — SODIUM CHLORIDE 0.9 % IV SOLN
500.0000 mg | Freq: Once | INTRAVENOUS | Status: AC
  Administered 2018-07-23: 500 mg via INTRAVENOUS
  Filled 2018-07-23: qty 500

## 2018-07-23 MED ORDER — TOPIRAMATE 100 MG PO TABS
100.0000 mg | ORAL_TABLET | Freq: Every day | ORAL | Status: DC
  Administered 2018-07-24 – 2018-07-31 (×8): 100 mg via ORAL
  Filled 2018-07-23 (×8): qty 1

## 2018-07-23 NOTE — ED Triage Notes (Signed)
Pt from home,  Home health nurse called ems for low oxygen sat.  79% on room.  Placed on 4L [place]. Came up to 94%.  VSS

## 2018-07-23 NOTE — ED Provider Notes (Addendum)
[facility] EMERGENCY DEPARTMENT Provider Note   CSN: [id number] Arrival date & time: 07/23/18  1339    History   Chief Complaint Chief Complaint  Patient presents with  . Shortness of Breath    HPI Ashley Hall is a 73 y.o. female.     HPI   Patient presents from home by EMS for evaluation of hypoxia.  She is reportedly a cigarette smoker and the nurse was there today that was concerned because her oxygen saturation was low at 79%.  EMS arrived placed her on 4 L nasal cannula oxygen she was transferred here.  She is not on oxygen at home.  Patient is unable to give any history.  Level 5 caveat-altered mental status  Past Medical History:  Diagnosis Date  . Acid reflux   . Alcohol abuse, in remission   . Arthritis   . B12 DEFICIENCY 06/15/2009   Qualifier: Diagnosis of  By: Delfino Lovett    . Bipolar 1 disorder ([place])   . Chronic back pain    Dr Merlene Laughter  . Chronic constipation   . COPD (chronic obstructive pulmonary disease) ([place])   . Depression   . ETOH abuse   . Hyperlipidemia 07/24/2018  . Hypertension   . Neurotic depression   . Personality disorder ([place])   . S/P colonoscopy 12/28/05   Dr Catalina Antigua prep, lipoma left colon  . 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 Seizures ([place])   . Sinus drainage   . Tobacco abuse 05/06/2014    Patient Active Problem List   Diagnosis Date Noted  . Lung mass   . Goals of care, counseling/discussion   . Palliative care by specialist   . DNR (do not resuscitate) discussion   . Hypoxia   . Hyperlipidemia 07/24/2018  . COPD exacerbation ([place]) 07/24/2018  . Normocytic anemia 07/23/2018  . Malnutrition of moderate degree 06/20/2018  . Acute encephalopathy 06/19/2018  . Opioid overdose ([place]) 06/19/2018  . Acetaminophen toxicity 06/19/2018  . Fall at home, initial encounter 06/19/2018  . Chest pain 06/19/2018  . Hypokalemia 06/19/2018  . Hypomagnesemia 06/19/2018  . Hypercalcemia 06/19/2018  .  Gastroesophageal reflux disease without esophagitis 06/13/2016  . Demand ischemia ([place]) 05/07/2014  . Altered mental state 05/06/2014  . Tobacco abuse 05/06/2014  . ETOH abuse 05/06/2014  . Drug overdose 05/06/2014  . Elevated troponin 05/06/2014  . CAP (community acquired pneumonia) 05/06/2014  . Lactic acidosis 05/06/2014  . Leukocytosis 05/06/2014  . Severe depression ([place]) 05/06/2014  . Insomnia 05/06/2014  . Hypothyroidism 05/06/2014  . GERD (gastroesophageal reflux disease) 05/06/2014  . Rhabdomyolysis 05/06/2014  . Physical deconditioning 05/06/2014  . Altered mental status   . Aspiration pneumonia ([place])   . Abnormal CT scan, pancreas or bile duct 11/25/2010  . Abdominal pain 11/25/2010  . ABNORMAL ELECTROCARDIOGRAM 06/16/2009  . B12 DEFICIENCY 06/15/2009  . NONDEPENDENT TOBACCO USE DISORDER 06/15/2009  . Essential hypertension 06/15/2009  . COPD (chronic obstructive pulmonary disease) ([place]) 06/15/2009  . SYNCOPE AND COLLAPSE 06/15/2009  . OTHER MALAISE AND FATIGUE 06/15/2009  . BACK PAIN 03/25/2007    Past Surgical History:  Procedure Laterality Date  . APPENDECTOMY    . complete hysterectomy    . ESOPHAGOGASTRODUODENOSCOPY N/A 06/16/2016   Procedure: ESOPHAGOGASTRODUODENOSCOPY (EGD);  Surgeon: Rogene Houston, MD;  Location: [facility] ENDO SUITE;  Service: Endoscopy;  Laterality: N/A;  1200     OB History   No obstetric history on file.      Home Medications  Prior to Admission medications   Medication Sig Start Date End Date Taking? Authorizing Provider  amLODipine (NORVASC) 5 MG tablet Take 5 mg by mouth daily.   Yes [provider]  feeding supplement, ENSURE ENLIVE, (ENSURE ENLIVE) LIQD Take 237 mLs by mouth 2 (two) times daily between meals. 06/26/18  Yes Shah, Pratik D, DO  levothyroxine (SYNTHROID, LEVOTHROID) 50 MCG tablet Take 50 mcg by mouth every morning.  04/08/14  Yes [provider]  metoprolol tartrate (LOPRESSOR) 25 MG tablet Take  25 mg by mouth 2 (two) times daily.  11/16/10  Yes [provider]  oxybutynin (DITROPAN) 5 MG tablet Take 5 mg by mouth daily. 06/08/18  Yes [provider]  pantoprazole (PROTONIX) 40 MG tablet TAKE ONE TABLET BY MOUTH TWICE DAILY. TAKE BEFORE A MEAL. (MORNING ,EVENING) Patient taking differently: Take 40 mg by mouth 2 (two) times daily.  01/24/18  Yes Setzer, Terri L, NP  PARoxetine (PAXIL) 20 MG tablet Take 1 tablet (20 mg total) by mouth every morning. 06/27/18  Yes Shah, Pratik D, DO  PROAIR HFA 108 (90 BASE) MCG/ACT inhaler Inhale 2 puffs into the lungs 4 (four) times daily as needed for wheezing or shortness of breath.  09/15/14  Yes [provider]  topiramate (TOPAMAX) 100 MG tablet Take 1 tablet (100 mg total) by mouth daily. 06/27/18  Yes Shah, Pratik D, DO  busPIRone (BUSPAR) 5 MG tablet Take 1 tablet (5 mg total) by mouth 2 (two) times daily for 30 days. 07/31/18 08/30/18  Johnson, Clanford L, MD  oxyCODONE (OXY IR/ROXICODONE) 5 MG immediate release tablet Take 0.5-1 tablets (2.5-5 mg total) by mouth every 4 (four) hours as needed for up to 5 days for moderate pain or severe pain. 07/31/18 08/05/18  Johnson, Clanford L, MD  predniSONE (DELTASONE) 10 MG tablet Take 1 tablet (10 mg total) by mouth daily with breakfast for 5 days. 07/31/18 08/05/18  Murlean Iba, MD  traZODone (DESYREL) 50 MG tablet Take 0.5 tablets (25 mg total) by mouth at bedtime. 07/31/18   Johnson, Clanford L, MD  vancomycin (VANCOCIN) 50 mg/mL oral solution Take 2.5 mLs (125 mg total) by mouth 4 (four) times daily for 8 days. 07/31/18 08/08/18  Murlean Iba, MD    Family History Family History  Problem Relation Age of Onset  . Heart attack Mother        Deceased  . Diabetes Mother        Deceased  . Cancer Father        Deceased  . Schizophrenia Brother     Social History Social History   Tobacco Use  . Smoking status: Heavy Tobacco Smoker    Packs/day: 0.50    Years: 48.00    Pack  years: 24.00    Types: Cigarettes  . Smokeless tobacco: Never Used  . Tobacco comment: smoke the fake cigarettes now  Substance Use Topics  . Alcohol use: Yes    Comment: heavy etoh x61yrs, quit 2004  . Drug use: No    Frequency: 5.0 times per week     Allergies   Pregabalin   Review of Systems Review of Systems  All other systems reviewed and are negative.    Physical Exam Updated Vital Signs BP (!) 146/63 (BP Location: Right Arm)   Pulse 76   Temp 98.3 F (36.8 C) (Axillary)   Resp 20   Ht 5\' 2"  (1.575 m)   Wt 53.5 kg Comment: Bed weight  SpO2  92%   BMI 21.57 kg/m   Physical Exam Vitals signs and nursing note reviewed.  Constitutional:      General: She is in acute distress.     Appearance: She is well-developed. She is ill-appearing. She is not toxic-appearing or diaphoretic.     Comments: Elderly, frail  HENT:     Head: Normocephalic and atraumatic.     Right Ear: External ear normal.     Left Ear: External ear normal.     Nose: Nose normal.     Mouth/Throat:     Mouth: Mucous membranes are moist.  Eyes:     Extraocular Movements: Extraocular movements intact.     Conjunctiva/sclera: Conjunctivae normal.     Pupils: Pupils are equal, round, and reactive to light.  Neck:     Musculoskeletal: Normal range of motion and neck supple.     Trachea: Phonation normal.  Cardiovascular:     Rate and Rhythm: Normal rate and regular rhythm.     Heart sounds: Normal heart sounds.  Pulmonary:     Effort: Pulmonary effort is normal. No respiratory distress.     Breath sounds: No stridor. No rhonchi.     Comments: Decreased air movement bilaterally Chest:     Chest wall: No tenderness.  Abdominal:     General: There is no distension.     Palpations: Abdomen is soft.     Tenderness: There is no abdominal tenderness.  Musculoskeletal:     Comments: Neck with left lateral bending, unable to straighten, which appears chronic.  Skin:    General: Skin is warm and  dry.  Neurological:     Mental Status: She is alert.     Cranial Nerves: No cranial nerve deficit.     Motor: No abnormal muscle tone.     Comments: No dysarthria.  Lethargic.  Psychiatric:     Comments: Obtunded      ED Treatments / Results  Labs (all labs ordered are listed, but only abnormal results are displayed) Labs Reviewed  C DIFFICILE QUICK SCREEN W PCR REFLEX - Abnormal; Notable for the following components:      Result Value   C Diff antigen POSITIVE (*)    C Diff toxin POSITIVE (*)    All other components within normal limits  BASIC METABOLIC PANEL - Abnormal; Notable for the following components:   Glucose, Bld 100 (*)    Creatinine, Ser 1.01 (*)    Calcium 12.6 (*)    GFR calc non Af Amer 56 (*)    All other components within normal limits  CBC WITH DIFFERENTIAL/PLATELET - Abnormal; Notable for the following components:   RBC 3.49 (*)    Hemoglobin 10.0 (*)    HCT 34.7 (*)    MCHC 28.8 (*)    All other components within normal limits  HEPATIC FUNCTION PANEL - Abnormal; Notable for the following components:   Albumin 3.0 (*)    AST 42 (*)    Total Bilirubin 0.2 (*)    Indirect Bilirubin 0.1 (*)    All other components within normal limits  CALCIUM, IONIZED - Abnormal; Notable for the following components:   Calcium, Ionized, Serum 7.5 (*)    All other components within normal limits  CBC WITH DIFFERENTIAL/PLATELET - Abnormal; Notable for the following components:   WBC 3.3 (*)    RBC 3.37 (*)    Hemoglobin 10.0 (*)    HCT 34.1 (*)    MCV 101.2 (*)  MCHC 29.3 (*)    Lymphs Abs 0.3 (*)    All other components within normal limits  RENAL FUNCTION PANEL - Abnormal; Notable for the following components:   Glucose, Bld 130 (*)    Creatinine, Ser 1.05 (*)    Calcium 11.7 (*)    Albumin 2.4 (*)    GFR calc non Af Amer 54 (*)    All other components within normal limits  PARATHYROID HORMONE, INTACT (NO CA) - Abnormal; Notable for the following  components:   PTH 5 (*)    All other components within normal limits  CBC WITH DIFFERENTIAL/PLATELET - Abnormal; Notable for the following components:   RBC 3.57 (*)    Hemoglobin 10.6 (*)    MCV 101.4 (*)    MCHC 29.3 (*)    All other components within normal limits  RENAL FUNCTION PANEL - Abnormal; Notable for the following components:   Potassium 3.2 (*)    Chloride 112 (*)    Calcium 11.8 (*)    Phosphorus 1.7 (*)    Albumin 2.7 (*)    All other components within normal limits  BASIC METABOLIC PANEL - Abnormal; Notable for the following components:   Potassium 3.2 (*)    Calcium 11.9 (*)    All other components within normal limits  CBC WITH DIFFERENTIAL/PLATELET - Abnormal; Notable for the following components:   RBC 3.22 (*)    Hemoglobin 9.4 (*)    HCT 31.8 (*)    MCHC 29.6 (*)    All other components within normal limits  RENAL FUNCTION PANEL - Abnormal; Notable for the following components:   Potassium 3.4 (*)    Chloride 113 (*)    Calcium 11.1 (*)    Phosphorus 1.8 (*)    Albumin 2.3 (*)    All other components within normal limits  MAGNESIUM - Abnormal; Notable for the following components:   Magnesium 1.6 (*)    All other components within normal limits  COMPREHENSIVE METABOLIC PANEL - Abnormal; Notable for the following components:   Potassium 3.0 (*)    Chloride 113 (*)    BUN 7 (*)    Calcium 11.3 (*)    Total Protein 5.6 (*)    Albumin 2.2 (*)    All other components within normal limits  PROTEIN ELECTROPHORESIS, SERUM - Abnormal; Notable for the following components:   Total Protein ELP 5.8 (*)    Albumin ELP 2.6 (*)    All other components within normal limits  KAPPA/LAMBDA LIGHT CHAINS - Abnormal; Notable for the following components:   Kappa free light chain 56.9 (*)    Lamda free light chains 59.9 (*)    All other components within normal limits  CALCITRIOL (1,25 DI-OH VIT D) - Abnormal; Notable for the following components:   Vit D,  1,25-Dihydroxy 90.2 (*)    All other components within normal limits  MAGNESIUM - Abnormal; Notable for the following components:   Magnesium 1.6 (*)    All other components within normal limits  COMPREHENSIVE METABOLIC PANEL - Abnormal; Notable for the following components:   BUN 6 (*)    Calcium 12.0 (*)    Albumin 2.8 (*)    AST 42 (*)    All other components within normal limits  COMPREHENSIVE METABOLIC PANEL - Abnormal; Notable for the following components:   Potassium 3.4 (*)    CO2 20 (*)    Glucose, Bld 128 (*)    BUN 6 (*)  Calcium 10.8 (*)    Total Protein 5.7 (*)    Albumin 2.3 (*)    All other components within normal limits  CBC - Abnormal; Notable for the following components:   RBC 3.11 (*)    Hemoglobin 8.9 (*)    HCT 29.9 (*)    MCHC 29.8 (*)    All other components within normal limits  BLOOD GAS, ARTERIAL - Abnormal; Notable for the following components:   pH, Arterial 7.456 (*)    pO2, Arterial 70.7 (*)    All other components within normal limits  RENAL FUNCTION PANEL - Abnormal; Notable for the following components:   Chloride 112 (*)    CO2 19 (*)    Glucose, Bld 117 (*)    Phosphorus 2.4 (*)    Albumin 2.3 (*)    All other components within normal limits  BLOOD GAS, VENOUS  MAGNESIUM  PHOSPHORUS  STREP PNEUMONIAE URINARY ANTIGEN  VITAMIN D 25 HYDROXY (VIT D DEFICIENCY, FRACTURES)  RPR  HIV ANTIBODY (ROUTINE TESTING W REFLEX)  PTH-RELATED PEPTIDE  PATHOLOGIST SMEAR REVIEW  PHOSPHORUS  OSMOLALITY, URINE  OSMOLALITY  SODIUM, URINE, RANDOM    EKG EKG Interpretation  Date/Time:  Tuesday July 23 2018 14:35:02 EST Ventricular Rate:  83 PR Interval:    QRS Duration: 95 QT Interval:  344 QTC Calculation: 405 R Axis:   -49 Text Interpretation:  Sinus rhythm Left anterior fascicular block Anterior infarct, old Since last tracing T wave abnormality has resolved Confirmed by Daleen Bo [phone]) on 07/23/2018 5:47:00  PM   Radiology No results found.  Procedures .Critical Care Performed by: Daleen Bo, MD Authorized by: Daleen Bo, MD   Critical care provider statement:    Critical care time (minutes):  45   Critical care start time:  07/23/2018 1:50 PM   Critical care end time:  07/23/2018 7:15 PM   Critical care time was exclusive of:  Separately billable procedures and treating other patients   Critical care was necessary to treat or prevent imminent or life-threatening deterioration of the following conditions:  Respiratory failure   Critical care was time spent personally by me on the following activities:  Blood draw for specimens, development of treatment plan with patient or surrogate, discussions with consultants, evaluation of patient's response to treatment, examination of patient, obtaining history from patient or surrogate, ordering and performing treatments and interventions, ordering and review of laboratory studies, pulse oximetry, re-evaluation of patient's condition, review of old charts and ordering and review of radiographic studies   (including critical care time)  Medications Ordered in ED Medications  acetaminophen (TYLENOL) tablet 650 mg (650 mg Oral Given 07/23/18 1820)  cefTRIAXone (ROCEPHIN) 1 g in sodium chloride 0.9 % 100 mL IVPB (0 g Intravenous Stopped 07/23/18 1905)  azithromycin (ZITHROMAX) 500 mg in sodium chloride 0.9 % 250 mL IVPB (500 mg Intravenous New Bag/Given 07/23/18 1911)  azithromycin (ZITHROMAX) 500 mg in sodium chloride 0.9 % 250 mL IVPB (500 mg Intravenous New Bag/Given 07/29/18 2006)  sodium chloride 0.9 % bolus 1,000 mL (0 mLs Intravenous Stopped 07/24/18 0100)  ketorolac (TORADOL) 15 MG/ML injection 15 mg (15 mg Intravenous Given 07/23/18 2150)  methylPREDNISolone sodium succinate (SOLU-MEDROL) 40 mg/mL injection 40 mg (40 mg Intravenous Given 07/23/18 2150)  magnesium sulfate IVPB 2 g 50 mL (2 g Intravenous New Bag/Given 07/23/18 2200)  potassium  chloride SA (K-DUR,KLOR-CON) CR tablet 40 mEq (40 mEq Oral Given 07/27/18 1550)  magnesium sulfate IVPB 2 g 50 mL (2  g Intravenous New Bag/Given 07/26/18 2211)  potassium chloride SA (K-DUR,KLOR-CON) CR tablet 40 mEq (40 mEq Oral Given 07/27/18 2300)  iohexol (OMNIPAQUE) 300 MG/ML solution 75 mL (75 mLs Intravenous Contrast Given 07/27/18 1751)  zolendronic acid (ZOMETA) 4 mg in sodium chloride 0.9 % 100 mL IVPB (4 mg Intravenous New Bag/Given 07/28/18 1903)  magnesium sulfate IVPB 2 g 50 mL (2 g Intravenous New Bag/Given 07/28/18 1904)  ceFEPIme (MAXIPIME) 2 g in sodium chloride 0.9 % 100 mL IVPB (2 g Intravenous New Bag/Given 07/29/18 2221)     Initial Impression / Assessment and Plan / ED Course  I have reviewed the triage vital signs and the nursing notes.  Pertinent labs & imaging results that were available during my care of the patient were reviewed by me and considered in my medical decision making (see chart for details).  Clinical Course as of Aug 04 1113  Tue Jul 23, 2018  1535 Normal  Blood gas, venous [EW]  1535 Normal except hemoglobin low  CBC with Differential(!) [EW]  1535 Normal except glucose high, creatinine high, calcium high GFR low  Basic metabolic panel(!) [EW]  2878 Left lung infiltrate, images reviewed by me  DG Chest 2 View [EW]  1745 Blood gas, venous [EW]  1747 Temp: 98.3 F (36.8 C) [EW]  1827 Creatinine level calculated based on albumin, 13.4.  nRBC: 0.0 [EW]    Clinical Course User Index [EW] Daleen Bo, MD        No data found.  5:45 PM Reevaluation with update and discussion. After initial assessment and treatment, an updated evaluation reveals patient is now alert, and conversant, not sleepy.  She is responsive and interactive.  No respiratory distress at this time.Daleen Bo   Medical Decision Making: Patient presenting for evaluation of hypoxia, she is a nontobacco abuser.  Incidental finding of hypercalcemia, stability greater than 13.   Ionized calcium ordered and pending.  Patient is sleepy, without evidence for PCO2 elevation.  Concern present for CNS depression secondary to hypercalcemia.  But patient's mentation improved after she received IV fluids and oxygen.  No cardiovascular instability or prolonged QT.  No clear muscle weakness.  Orders for acute intra-abdominal process.  No evidence for acute renal dysfunction.  Patient will require admission for stabilization and treatment, and will likely require oxygen prior to discharge.  CRITICAL CARE-yes Performed by: Daleen Bo  Nursing Notes Reviewed/ Care Coordinated Applicable Imaging Reviewed Interpretation of Laboratory Data incorporated into ED treatment   11:15 AM-Consult complete with hospitalist. Patient case explained and discussed.  He agrees to admit patient for further evaluation and treatment. Call ended at 7:19 PM  Plan: Admit  Final Clinical Impressions(s) / ED Diagnoses   Final diagnoses:  Hypoxia  Community acquired pneumonia of left lung, unspecified part of lung  Hypercalcemia    ED Discharge Orders         Ordered    busPIRone (BUSPAR) 5 MG tablet  2 times daily     07/31/18 1156    traZODone (DESYREL) 50 MG tablet  Daily at bedtime     07/31/18 1156    vancomycin (VANCOCIN) 50 mg/mL oral solution  4 times daily     07/31/18 1156    predniSONE (DELTASONE) 10 MG tablet  Daily with breakfast     07/31/18 1156    oxyCODONE (OXY IR/ROXICODONE) 5 MG immediate release tablet  Every 4 hours PRN     07/31/18 1156  Daleen Bo, MD 07/23/18 Evelina Bucy    Daleen Bo, MD 08/05/18 1115

## 2018-07-23 NOTE — H&P (Signed)
History and Physical    Ashley Hall HYW:[id number] DOB: March 23, 1946 DOA: 07/23/2018  PCP: Lucia Gaskins, MD   Patient coming from: Home.  I have personally briefly reviewed patient's old medical records in [place]  Chief Complaint: Shortness of breath.  HPI: Ashley Hall is a 73 y.o. female with medical history significant of GERD, alcohol abuse in remission, osteoarthritis, bipolar 1 disorder, depression, personality disorder, history of anxiolytic dependence, chronic back pain, chronic constipation, COPD, tobacco abuse, hypertension, hyperlipidemia, hypothyroidism, B12 deficiency, history of seizures who is coming to the emergency department after her home health nurse found her at home hypoxic with an O2 sat of 79% and called EMS.  The patient states that she has had memory problems for years and was unable to provide any history.  ED Course: On arrival to the emergency department her temperature was 98.3, but subsequently increased to 100.9 F 2 hours later.  Pulse was 77, respirations 19, blood pressure 164/54 mmHg and O2 sat was 92% on room air.  She received a 1000 mL NS bolus, 1 g of ceftriaxone and 500 mg of azithromycin IVPB.  White count is 9.2, with 84% neutrophils and 10% lymphocytes.  Hemoglobin 10.0 g/dL and platelets 184.  BMP shows a glucose of 100 mg/dL, BUN of 15, creatinine of 1.01 and calcium of 12.6 mg/dL.  Ionized calcium still pending.  All other electrolytes were normal including a phosphorus of 2.7 and magnesium of 1.9 mg/dL.  Venous blood gas was normal.  Albumin was 3.0 as per deciliter and AST 42 units/L.  The rest of the hepatic functions were unremarkable.  Her chest radiograph show bilateral infiltrates.  Please see images and full radiology report for further detail.  Review of Systems: Unable to obtain.  Past Medical History:  Diagnosis Date  . Acid reflux   . Alcohol abuse, in remission   . Arthritis   . Bipolar 1 disorder ([place])   .  Chronic back pain    Dr Merlene Laughter  . Chronic constipation   . COPD (chronic obstructive pulmonary disease) ([place])   . Depression   . ETOH abuse   . Hypertension   . Neurotic depression   . Personality disorder ([place])   . S/P colonoscopy 12/28/05   Dr Catalina Antigua prep, lipoma left colon  . Sedative, hypnotic or anxiolytic dependence ([place])   . Seizures ([place])   . Sinus drainage     Past Surgical History:  Procedure Laterality Date  . APPENDECTOMY    . complete hysterectomy    . ESOPHAGOGASTRODUODENOSCOPY N/A 06/16/2016   Procedure: ESOPHAGOGASTRODUODENOSCOPY (EGD);  Surgeon: Rogene Houston, MD;  Location: [facility];  Service: Endoscopy;  Laterality: N/A;  1200     reports that she has been smoking cigarettes. She has a 24.00 pack-year smoking history. She has never used smokeless tobacco. She reports current alcohol use. She reports that she does not use drugs.  Allergies  Allergen Reactions  . Pregabalin     REACTION: "couldn't move"    Family History  Problem Relation Age of Onset  . Heart attack Mother        Deceased  . Diabetes Mother        Deceased  . Cancer Father        Deceased  . Schizophrenia Brother    Prior to Admission medications   Medication Sig Start Date End Date Taking? Authorizing Provider  feeding supplement, ENSURE ENLIVE, (ENSURE ENLIVE) LIQD Take 237 mLs by mouth  2 (two) times daily between meals. 06/26/18  Yes Shah, Pratik D, DO  gabapentin (NEURONTIN) 300 MG capsule Take 1 capsule (300 mg total) by mouth 3 (three) times daily for 30 days. 06/27/18 07/27/18 Yes Shah, Pratik D, DO  levothyroxine (SYNTHROID, LEVOTHROID) 50 MCG tablet Take 50 mcg by mouth every morning.  04/08/14  Yes [provider]  metoprolol tartrate (LOPRESSOR) 25 MG tablet Take 25 mg by mouth 2 (two) times daily.  11/16/10  Yes [provider]  nitroGLYCERIN (NITROSTAT) 0.4 MG SL tablet Place 0.4 mg under the tongue every 5 (five) minutes as needed for chest  pain.  06/14/18  Yes [provider]  pantoprazole (PROTONIX) 40 MG tablet TAKE ONE TABLET BY MOUTH TWICE DAILY. TAKE BEFORE A MEAL. (MORNING ,EVENING) Patient taking differently: Take 40 mg by mouth 2 (two) times daily.  01/24/18  Yes Setzer, Terri L, NP  PARoxetine (PAXIL) 20 MG tablet Take 1 tablet (20 mg total) by mouth every morning. 06/27/18  Yes Shah, Pratik D, DO  pravastatin (PRAVACHOL) 40 MG tablet Take 1 tablet (40 mg total) by mouth at bedtime. Patient taking differently: Take 40 mg by mouth every morning.  05/08/14  Yes Lucia Gaskins, MD  PROAIR HFA 108 (90 BASE) MCG/ACT inhaler Inhale 2 puffs into the lungs 4 (four) times daily as needed for wheezing or shortness of breath.  09/15/14  Yes [provider]  topiramate (TOPAMAX) 100 MG tablet Take 1 tablet (100 mg total) by mouth daily. 06/27/18  Yes Shah, Pratik D, DO  traZODone (DESYREL) 50 MG tablet Take 50 mg by mouth at bedtime. 07/17/18  Yes [provider]  Ascorbic Acid (VITAMIN C) 1000 MG tablet Take 1,000 mg by mouth daily.      [provider]  Aspirin-Salicylamide-Caffeine 902-409-73.5 MG PACK Take 1 Package by mouth as needed.    [provider]  B Complex-C (SUPER B COMPLEX PO) Take 1 tablet by mouth daily.     [provider]  busPIRone (BUSPAR) 5 MG tablet Take 1 tablet (5 mg total) by mouth 2 (two) times daily for 30 days. 06/26/18 07/26/18  Manuella Ghazi, Pratik D, DO  ferrous gluconate (FERGON) 324 MG tablet Take 1 tablet (324 mg total) by mouth daily with breakfast for 30 days. 06/26/18 07/26/18  Manuella Ghazi, Pratik D, DO  fish oil-omega-3 fatty acids 1000 MG capsule Take 2 g by mouth daily.      [provider]  folic acid (FOLVITE) 1 MG tablet Take 1 tablet (1 mg total) by mouth daily for 30 days. 06/27/18 07/27/18  Manuella Ghazi, Pratik D, DO  gabapentin (NEURONTIN) 100 MG capsule Take 2 capsules (200 mg total) by mouth 3 (three) times daily for 30 days. 06/26/18 07/26/18  Heath Lark D,  DO  Multiple Vitamin (MULTIVITAMIN) capsule Take 1 capsule by mouth daily.      [provider]  oxybutynin (DITROPAN) 5 MG tablet Take 5 mg by mouth daily. 06/08/18   [provider]  vitamin A 10000 UNIT capsule Take 8,000 Units by mouth daily.    [provider]  vitamin E 200 UNIT capsule Take 400 Units by mouth daily.    [provider]    Physical Exam: Vitals:   07/23/18 2000 07/23/18 2059 07/23/18 2102 07/23/18 2140  BP: (!) 162/69  (!) 142/67   Pulse:   74   Resp: 18  20   Temp:   98.1 F (36.7 C)   TempSrc:   Oral  SpO2: 96%  92% 94%  Weight:  48.1 kg    Height:  5\' 2"  (1.575 m)      Constitutional: Frail, ill-appearing. Eyes: PERRL, lids and conjunctivae mildly injected. ENMT: Mucous membranes are mildly dry.  Posterior pharynx clear of any exudate or lesions. Neck: normal, supple, no masses, no thyromegaly Respiratory: Decreased breath sounds on bases with bibasilar rales, bilateral rhonchi and bilateral wheezing, Normal respiratory effort. No accessory muscle use.  Cardiovascular: Regular rate and rhythm, no murmurs / rubs / gallops. No extremity edema. 2+ pedal pulses. No carotid bruits.  Abdomen: Soft, no tenderness, no masses palpated. No hepatosplenomegaly. Bowel sounds positive.  Musculoskeletal: Mild clubbing, no cyanosis. Good ROM, no contractures. Normal muscle tone.  Skin: no rashes, lesions, ulcers on limited dermatological examination. Neurologic: CN 2-12 grossly intact. Sensation intact, DTR normal. Strength 5/5 in all 4.  Psychiatric: Alert and oriented x 2, disoriented to time and situation.  Labs on Admission: I have personally reviewed following labs and imaging studies  CBC: Recent Labs  Lab 07/23/18 1358  WBC 9.2  NEUTROABS 7.7  HGB 10.0*  HCT 34.7*  MCV 99.4  PLT 956   Basic Metabolic Panel: Recent Labs  Lab 07/23/18 1358 07/23/18 1643  NA 135  --   K 4.2  --   CL 105  --   CO2 23  --   GLUCOSE  100*  --   BUN 15  --   CREATININE 1.01*  --   CALCIUM 12.6*  --   MG  --  1.9  PHOS  --  2.7   GFR: Estimated Creatinine Clearance: 39.4 mL/min (A) (by C-G formula based on SCr of 1.01 mg/dL (H)). Liver Function Tests: Recent Labs  Lab 07/23/18 1643  AST 42*  ALT 11  ALKPHOS 116  BILITOT 0.2*  PROT 6.8  ALBUMIN 3.0*   No results for input(s): LIPASE, AMYLASE in the last 168 hours. No results for input(s): AMMONIA in the last 168 hours. Coagulation Profile: No results for input(s): INR, PROTIME in the last 168 hours. Cardiac Enzymes: No results for input(s): CKTOTAL, CKMB, CKMBINDEX, TROPONINI in the last 168 hours. BNP (last 3 results) No results for input(s): PROBNP in the last 8760 hours. HbA1C: No results for input(s): HGBA1C in the last 72 hours. CBG: No results for input(s): GLUCAP in the last 168 hours. Lipid Profile: No results for input(s): CHOL, HDL, LDLCALC, TRIG, CHOLHDL, LDLDIRECT in the last 72 hours. Thyroid Function Tests: No results for input(s): TSH, T4TOTAL, FREET4, T3FREE, THYROIDAB in the last 72 hours. Anemia Panel: No results for input(s): VITAMINB12, FOLATE, FERRITIN, TIBC, IRON, RETICCTPCT in the last 72 hours. Urine analysis:    Component Value Date/Time   COLORURINE STRAW (A) 04/01/2017 0142   APPEARANCEUR CLEAR 04/01/2017 0142   LABSPEC 1.005 04/01/2017 0142   PHURINE 6.0 04/01/2017 0142   GLUCOSEU NEGATIVE 04/01/2017 0142   HGBUR NEGATIVE 04/01/2017 0142   BILIRUBINUR NEGATIVE 04/01/2017 0142   KETONESUR NEGATIVE 04/01/2017 0142   PROTEINUR NEGATIVE 04/01/2017 0142   UROBILINOGEN 0.2 05/06/2014 1225   NITRITE NEGATIVE 04/01/2017 0142   LEUKOCYTESUR NEGATIVE 04/01/2017 0142    Radiological Exams on Admission: Dg Chest 2 View  Result Date: 07/23/2018 CLINICAL DATA:  Shortness of breath.  Hypoxia. EXAM: CHEST - 2 VIEW COMPARISON:  Single-view of the chest 06/19/2018, 12/20/2017 and 05/11/2017. FINDINGS: Airspace disease in the left  chest seen on the most recent examination has worsened and now involves the lower lobe. There is  also new airspace disease in the right lung base. Heart size is normal. Aortic atherosclerosis. IMPRESSION: Airspace disease in the left lung seen on the most recent examination has worsened and there is new airspace disease in the right lung base consistent with progressive pneumonia. Atherosclerosis. Electronically Signed   By: Inge Rise M.D.   On: 07/23/2018 15:18    EKG: Independently reviewed. Vent. rate 83 BPM PR interval * ms QRS duration 95 ms QT/QTc 344/405 ms P-R-T axes 66 -49 50 Sinus rhythm Left anterior fascicular block Anterior infarct, old T-wave abnormality is absent when compared to previous tracing.  Assessment/Plan Principal Problem:   CAP (community acquired pneumonia) Admit to telemetry/inpatient. Continue supplemental oxygen. DuoNeb every 6 hours. Albuterol 2.5 via neb every 4 hours as needed. Continue ceftriaxone 1 g IVPB every 24 hours. Continue azithromycin 500 mg IVPB every 24 hours. Follow-up blood cultures and sensitivity. Check strep pneumoniae urinary antigen. Check sputum culture and sensitivity.  Active Problems:   COPD exacerbation (HCC) Continue supplemental oxygen and bronchodilators. Continue treatment for CAP as above. Solu-Medrol 40 mg IVP x1 dose given.    Altered mental status Worsened by hypoxia. B12 level was low normal. Recent TSH level was normal. Recent CT showed atrophy and white matter microvascular changes Check RPR to complete work-up. I believe the patient could benefit from B12 injections. Smoking cessation is a must to avoid further deterioration. Although this seems to be vascular in nature, Namenda and/donezepil to be considered.    Normocytic anemia Recent anemia panel showed significantly increased ferritin. It also show decreased TIBC and low normal B12 level 148 pg/mL.    Essential hypertension Continue  metoprolol 25 mg p.o. twice daily. Monitor blood pressure and heart rate.    Tobacco abuse Declined nicotine patch. Nicotine replacement therapy as needed ordered. Nurse to provide tobacco cessation information.    GERD (gastroesophageal reflux disease) Protonix 40 mg p.o. daily.    Hyperlipidemia Continue pravastatin 40 mg p.o. daily. Monitor LFTs as needed.    Hypothyroidism Continue levothyroxine 50 mcg p.o. daily. Monitor TSH as needed.   DVT prophylaxis: Lovenox SQ. Code Status: Full code. Family Communication: Disposition Plan: Admit for 2 to 3 days of IV antibiotics for CAP. Consults called: Admission status: Inpatient/telemetry.   Reubin Milan MD [facility]  07/23/2018, 11:13 PM   This document was prepared using Dragon voice recognition software and may contain some unintended transcription errors.

## 2018-07-24 ENCOUNTER — Encounter (HOSPITAL_COMMUNITY): Payer: Self-pay | Admitting: Internal Medicine

## 2018-07-24 DIAGNOSIS — K219 Gastro-esophageal reflux disease without esophagitis: Secondary | ICD-10-CM

## 2018-07-24 DIAGNOSIS — E039 Hypothyroidism, unspecified: Secondary | ICD-10-CM

## 2018-07-24 DIAGNOSIS — J441 Chronic obstructive pulmonary disease with (acute) exacerbation: Secondary | ICD-10-CM | POA: Diagnosis present

## 2018-07-24 DIAGNOSIS — I10 Essential (primary) hypertension: Secondary | ICD-10-CM

## 2018-07-24 DIAGNOSIS — E785 Hyperlipidemia, unspecified: Secondary | ICD-10-CM

## 2018-07-24 HISTORY — DX: Hyperlipidemia, unspecified: E78.5

## 2018-07-24 LAB — CALCIUM, IONIZED: Calcium, Ionized, Serum: 7.5 mg/dL — ABNORMAL HIGH (ref 4.5–5.6)

## 2018-07-24 LAB — CBC WITH DIFFERENTIAL/PLATELET
Abs Immature Granulocytes: 0.01 10*3/uL (ref 0.00–0.07)
BASOS PCT: 0 %
Basophils Absolute: 0 10*3/uL (ref 0.0–0.1)
Eosinophils Absolute: 0 10*3/uL (ref 0.0–0.5)
Eosinophils Relative: 0 %
HCT: 34.1 % — ABNORMAL LOW (ref 36.0–46.0)
Hemoglobin: 10 g/dL — ABNORMAL LOW (ref 12.0–15.0)
Immature Granulocytes: 0 %
Lymphocytes Relative: 9 %
Lymphs Abs: 0.3 10*3/uL — ABNORMAL LOW (ref 0.7–4.0)
MCH: 29.7 pg (ref 26.0–34.0)
MCHC: 29.3 g/dL — ABNORMAL LOW (ref 30.0–36.0)
MCV: 101.2 fL — ABNORMAL HIGH (ref 80.0–100.0)
Monocytes Absolute: 0.1 10*3/uL (ref 0.1–1.0)
Monocytes Relative: 2 %
Neutro Abs: 2.9 10*3/uL (ref 1.7–7.7)
Neutrophils Relative %: 89 %
PLATELETS: 178 10*3/uL (ref 150–400)
RBC: 3.37 MIL/uL — AB (ref 3.87–5.11)
RDW: 13.2 % (ref 11.5–15.5)
WBC Morphology: ABNORMAL
WBC: 3.3 10*3/uL — AB (ref 4.0–10.5)
nRBC: 0 % (ref 0.0–0.2)

## 2018-07-24 LAB — RENAL FUNCTION PANEL
Albumin: 2.4 g/dL — ABNORMAL LOW (ref 3.5–5.0)
Anion gap: 8 (ref 5–15)
BUN: 15 mg/dL (ref 8–23)
CHLORIDE: 111 mmol/L (ref 98–111)
CO2: 22 mmol/L (ref 22–32)
Calcium: 11.7 mg/dL — ABNORMAL HIGH (ref 8.9–10.3)
Creatinine, Ser: 1.05 mg/dL — ABNORMAL HIGH (ref 0.44–1.00)
GFR calc Af Amer: >60 mL/min (ref >60)
GFR calc non Af Amer: 54 mL/min — ABNORMAL LOW (ref >60)
GLUCOSE: 130 mg/dL — AB (ref 70–99)
Phosphorus: 2.9 mg/dL (ref 2.5–4.6)
Potassium: 4.1 mmol/L (ref 3.5–5.1)
Sodium: 141 mmol/L (ref 135–145)

## 2018-07-24 LAB — STREP PNEUMONIAE URINARY ANTIGEN: Strep Pneumo Urinary Antigen: NEGATIVE

## 2018-07-24 MED ORDER — SODIUM CHLORIDE 0.9 % IV SOLN
2.0000 g | INTRAVENOUS | Status: DC
  Administered 2018-07-24 – 2018-07-28 (×5): 2 g via INTRAVENOUS
  Filled 2018-07-24 (×6): qty 2

## 2018-07-24 MED ORDER — METHADONE HCL 5 MG PO TABS: 2.5000 mg | ORAL_TABLET | Freq: Two times a day (BID) | ORAL | Status: DC

## 2018-07-24 MED ORDER — ENSURE ENLIVE PO LIQD
237.0000 mL | Freq: Two times a day (BID) | ORAL | Status: DC
  Administered 2018-07-24 – 2018-07-29 (×7): 237 mL via ORAL

## 2018-07-24 MED ORDER — ENOXAPARIN SODIUM 40 MG/0.4ML ~~LOC~~ SOLN: 40.0000 mg | SUBCUTANEOUS | Status: DC

## 2018-07-24 MED ORDER — ORAL CARE MOUTH RINSE
15.0000 mL | Freq: Two times a day (BID) | OROMUCOSAL | Status: DC
  Administered 2018-07-24 – 2018-07-29 (×3): 15 mL via OROMUCOSAL

## 2018-07-24 MED ORDER — OXYCODONE HCL 5 MG PO TABS
2.5000 mg | ORAL_TABLET | ORAL | Status: DC | PRN
  Administered 2018-07-24 – 2018-07-28 (×13): 2.5 mg via ORAL
  Filled 2018-07-24 (×14): qty 1

## 2018-07-24 MED ORDER — ADULT MULTIVITAMIN W/MINERALS CH
1.0000 | ORAL_TABLET | Freq: Every day | ORAL | Status: DC
  Administered 2018-07-24 – 2018-07-30 (×7): 1 via ORAL
  Filled 2018-07-24 (×7): qty 1

## 2018-07-24 NOTE — Progress Notes (Signed)
Pharmacy Antibiotic Note  Ashley Hall is a 73 y.o. female admitted on 07/23/2018 with HCAP/ pneumonia.  Pharmacy has been consulted for cefepime dosing.  Plan: Cefepime 2gm IV q24h F/U cxs and clinical progress Monitor V/S, labs  Height: 5\' 2"  (157.5 cm) Weight: 117 lb 15.1 oz (53.5 kg)(Bed weight) IBW/kg (Calculated) : 50.1  Temp (24hrs), Avg:99 F (37.2 C), Min:98.1 F (36.7 C), Max:100.9 F (38.3 C)  Recent Labs  Lab 07/23/18 1358 07/24/18 0459  WBC 9.2 3.3*  CREATININE 1.01* 1.05*    Estimated Creatinine Clearance: 39.4 mL/min (A) (by C-G formula based on SCr of 1.05 mg/dL (H)).    Allergies  Allergen Reactions  . Pregabalin     REACTION: "couldn't move"    Antimicrobials this admission: Cefepime 2/26>>  Azithromycin 2/25 >>  Ceftriaxone 2/25 >>2/26  Dose adjustments this admission: N/A  Microbiology results: No cultures  Thank you for allowing pharmacy to be a part of this patient's care.  Isac Sarna, BS [place], [place] Clinical Pharmacist Pager [phone] 07/24/2018 3:49 PM

## 2018-07-24 NOTE — Progress Notes (Signed)
PROGRESS NOTE    Ashley Hall  OIN:[id number] DOB: 10-30-1945 DOA: 07/23/2018 PCP: Lucia Gaskins, MD    Brief Narrative:  73 year old female with a history of COPD, chronic pain syndrome from scoliosis, was recently discharged from skilled nursing facility and brought to the emergency room with shortness of breath.  She was found by her home health physical therapist with an oxygen saturation 79% on room air.  She was admitted for acute on respiratory failure with hypoxia secondary to healthcare associated pneumonia.  Chest x-ray did show bilateral infiltrates.  She is been started on intravenous antibiotics.  Assessment & Plan:   Principal Problem:   CAP (community acquired pneumonia) Active Problems:   Essential hypertension   Tobacco abuse   Hypothyroidism   GERD (gastroesophageal reflux disease)   Altered mental status   Normocytic anemia   Hyperlipidemia   COPD exacerbation ([place])   1. Acute respiratory failure with hypoxia.  Secondary to pneumonia.  Continue to wean down oxygen as tolerated. 2. Healthcare associated pneumonia.  Continue on broad-spectrum antibiotics with cefepime and azithromycin.  Blood cultures have been sent.  Urinary antigens have been ordered. 3. COPD exacerbation.  Continue on intravenous Solu-Medrol.  Continue bronchodilators. 4. Altered mental status.  Likely related to hypoxia.  This is improved with oxygen supplementation and mental status appears to be approaching baseline.  CT scan of her head did not show any acute changes. 5. Anemia.  She is noted to have significant B12 deficiency.  She has been started on replacement therapy. 6. Hypertension.  Continue metoprolol.  Blood pressure stable. 7. Tobacco use.  Counseled on importance of tobacco cessation 8. GERD.  Continue on PPI.  9. Hyperlipidemia.  Continue statin. 10. Hypothyroidism.  Continue Synthroid   DVT prophylaxis: Lovenox Code Status: Full code Family Communication: Discussed  with niece at the bedside Disposition Plan: Discharge home once respiratory status has improved   Consultants:     Procedures:     Antimicrobials:   Cefepime 2/26 >  Azithromycin 2/25 >   Subjective: Reports chronic pain in her back and she is upset that she is not getting her regular pain medicines.  She is coughing.  Feels that her breathing is about the same as it was on admission.  Objective: Vitals:   07/24/18 1013 07/24/18 1131 07/24/18 1511 07/24/18 1518  BP: (!) 160/64  (!) 159/72   Pulse: 88  72   Resp:   18   Temp:      TempSrc:      SpO2:   91% (!) 88%  Weight:  53.5 kg    Height:        Intake/Output Summary (Last 24 hours) at 07/24/2018 1928 Last data filed at 07/24/2018 1646 Gross per 24 hour  Intake 2729.41 ml  Output 800 ml  Net 1929.41 ml   Filed Weights   07/23/18 1851 07/23/18 2059 07/24/18 1131  Weight: 50 kg 48.1 kg 53.5 kg    Examination:  General exam: Appears calm and comfortable  Respiratory system: Bilateral rhonchi. Respiratory effort normal. Cardiovascular system: S1 & S2 heard, RRR. No JVD, murmurs, rubs, gallops or clicks. No pedal edema. Gastrointestinal system: Abdomen is nondistended, soft and nontender. No organomegaly or masses felt. Normal bowel sounds heard. Central nervous system: Alert and oriented. No focal neurological deficits. Extremities: Symmetric 5 x 5 power. Skin: No rashes, lesions or ulcers Psychiatry: Judgement and insight appear normal. Mood & affect appropriate.     Data Reviewed: I have personally  reviewed following labs and imaging studies  CBC: Recent Labs  Lab 07/23/18 1358 07/24/18 0459  WBC 9.2 3.3*  NEUTROABS 7.7 2.9  HGB 10.0* 10.0*  HCT 34.7* 34.1*  MCV 99.4 101.2*  PLT 184 366   Basic Metabolic Panel: Recent Labs  Lab 07/23/18 1358 07/23/18 1643 07/24/18 0459  NA 135  --  141  K 4.2  --  4.1  CL 105  --  111  CO2 23  --  22  GLUCOSE 100*  --  130*  BUN 15  --  15    CREATININE 1.01*  --  1.05*  CALCIUM 12.6*  --  11.7*  MG  --  1.9  --   PHOS  --  2.7 2.9   GFR: Estimated Creatinine Clearance: 39.4 mL/min (A) (by C-G formula based on SCr of 1.05 mg/dL (H)). Liver Function Tests: Recent Labs  Lab 07/23/18 1643 07/24/18 0459  AST 42*  --   ALT 11  --   ALKPHOS 116  --   BILITOT 0.2*  --   PROT 6.8  --   ALBUMIN 3.0* 2.4*   No results for input(s): LIPASE, AMYLASE in the last 168 hours. No results for input(s): AMMONIA in the last 168 hours. Coagulation Profile: No results for input(s): INR, PROTIME in the last 168 hours. Cardiac Enzymes: No results for input(s): CKTOTAL, CKMB, CKMBINDEX, TROPONINI in the last 168 hours. BNP (last 3 results) No results for input(s): PROBNP in the last 8760 hours. HbA1C: No results for input(s): HGBA1C in the last 72 hours. CBG: No results for input(s): GLUCAP in the last 168 hours. Lipid Profile: No results for input(s): CHOL, HDL, LDLCALC, TRIG, CHOLHDL, LDLDIRECT in the last 72 hours. Thyroid Function Tests: No results for input(s): TSH, T4TOTAL, FREET4, T3FREE, THYROIDAB in the last 72 hours. Anemia Panel: No results for input(s): VITAMINB12, FOLATE, FERRITIN, TIBC, IRON, RETICCTPCT in the last 72 hours. Sepsis Labs: No results for input(s): PROCALCITON, LATICACIDVEN in the last 168 hours.  No results found for this or any previous visit (from the past 240 hour(s)).       Radiology Studies: Dg Chest 2 View  Result Date: 07/23/2018 CLINICAL DATA:  Shortness of breath.  Hypoxia. EXAM: CHEST - 2 VIEW COMPARISON:  Single-view of the chest 06/19/2018, 12/20/2017 and 05/11/2017. FINDINGS: Airspace disease in the left chest seen on the most recent examination has worsened and now involves the lower lobe. There is also new airspace disease in the right lung base. Heart size is normal. Aortic atherosclerosis. IMPRESSION: Airspace disease in the left lung seen on the most recent examination has worsened  and there is new airspace disease in the right lung base consistent with progressive pneumonia. Atherosclerosis. Electronically Signed   By: Inge Rise M.D.   On: 07/23/2018 15:18        Scheduled Meds: . busPIRone  5 mg Oral BID  . enoxaparin (LOVENOX) injection  40 mg Subcutaneous Q24H  . feeding supplement (ENSURE ENLIVE)  237 mL Oral BID BM  . ferrous gluconate  324 mg Oral Q breakfast  . folic acid  1 mg Oral Daily  . gabapentin  200 mg Oral TID  . ipratropium-albuterol  3 mL Nebulization Q6H  . levothyroxine  50 mcg Oral q morning - 10a  . mouth rinse  15 mL Mouth Rinse BID  . methadone  2.5 mg Oral BID  . metoprolol tartrate  25 mg Oral BID  . multivitamin with minerals  1  tablet Oral Daily  . oxybutynin  5 mg Oral Daily  . pantoprazole  40 mg Oral BID  . PARoxetine  20 mg Oral q morning - 10a  . pravastatin  40 mg Oral q morning - 10a  . topiramate  100 mg Oral Daily  . traZODone  50 mg Oral QHS   Continuous Infusions: . sodium chloride 500 mL (07/23/18 1823)  . sodium chloride 100 mL/hr at 07/24/18 1245  . azithromycin    . ceFEPime (MAXIPIME) IV 2 g (07/24/18 1646)     LOS: 1 day    Time spent: 15mins    Kathie Dike, MD [facility]   If 7PM-7AM, please contact night-coverage [email or web address]  07/24/2018, 7:28 PM
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
## 2018-07-24 NOTE — Progress Notes (Signed)
OT Cancellation Note  Patient Details Name: MONI ROTHROCK MRN: [id number] DOB: [birth year]   Cancelled Treatment:    Reason Eval/Treat Not Completed: Patient's level of consciousness;Fatigue/lethargy limiting ability to participate. Attempted to see pt this am, pt awakened to verbal and gentle tactile cuing, max difficulty remaining awake to converse with OT. Pt constantly falling back to sleep and making effort to wake up when cued by OT. Pt attempting to converse and participate, however unable to maintain alert state for participation. Will check back at a later time when pt able to fully participate.   Guadelupe Sabin, OTR/L  [phone] 07/24/2018, 7:49 AM

## 2018-07-24 NOTE — Progress Notes (Signed)
Tried to coach patient on using the IS. She refused. She stated if blow into something she couldn't and wouldn't do it. I told her she had to take a slow deep breath in it she said well I aint doing that either. I told her it would help her get better faster. She still did not want to do it. Device was left in the room with instruction.

## 2018-07-24 NOTE — Plan of Care (Signed)
  Problem: Acute Rehab PT Goals(only PT should resolve) Goal: Pt Will Go Supine/Side To Sit Outcome: Progressing Flowsheets (Taken 07/24/2018 1252) Pt will go Supine/Side to Sit: with min guard assist Goal: Pt Will Go Sit To Supine/Side Outcome: Progressing Flowsheets (Taken 07/24/2018 1252) Pt will go Sit to Supine/Side: with min guard assist Goal: Patient Will Transfer Sit To/From Stand Outcome: Progressing Flowsheets (Taken 07/24/2018 1252) Patient will transfer sit to/from stand: with supervision Goal: Pt Will Transfer Bed To Chair/Chair To Bed Outcome: Progressing Flowsheets (Taken 07/24/2018 1252) Pt will Transfer Bed to Chair/Chair to Bed: with supervision Goal: Pt Will Ambulate Outcome: Progressing Flowsheets (Taken 07/24/2018 1252) Pt will Ambulate: 25 feet; with minimal assist; with rolling walker   12:52 PM, 07/24/18 Talbot Grumbling, DPT Physical Therapist with [facility] [phone] office

## 2018-07-24 NOTE — Evaluation (Signed)
Physical Therapy Evaluation Patient Details Name: Ashley Hall MRN: [id number] DOB: [birth year] Today's Date: 07/24/2018   History of Present Illness  Ashley Hall is a 73 y.o. female with medical history significant of GERD, alcohol abuse in remission, osteoarthritis, bipolar 1 disorder, depression, personality disorder, history of anxiolytic dependence, chronic back pain, chronic constipation, COPD, tobacco abuse, hypertension, hyperlipidemia, hypothyroidism, B12 deficiency, history of seizures who is coming to the emergency department after her home health nurse found her at home hypoxic with an O2 sat of 79% and called EMS.  The patient states that she has had memory problems for years and was unable to provide any history.    Clinical Impression  Patient presents supine in bed, angry that she has not received pain medication, but is agreeable to therapy with much encouragement. Patient near baseline for functional mobility and gait, slightly limited secondary to generalized weakness and fatigue with activity. Pt requires requires min for returning to supine, but mod for supine to sit secondary to pain with raising trunk to sitting. Pt performs transfers with HHA secondary to pt's refusal to use RW and able to take sidesteps at bedside with HHA, min assist and refuses to ambulate further distance. Pt able to maintain static and dynamic standing with HHA for up to 3 minutes before requiring return to sitting. Pt agitated occasionally during session requiring redirection and encouragement to participate in therapy to improve functional mobility and return home. Pt limited by pain and limited participation in therapy. Pt on 4 LPM O2 and O2 sat 95-96% with mobility, no shortness of breath noted. Pt left supine in bed, HOB elevated, bed alarm on and with call bell in reach. Patient will benefit from continued physical therapy in hospital and recommended venue below to increase strength, balance,  endurance for safe ADLs and gait.     Follow Up Recommendations Home health PT;Supervision/Assistance - 24 hour;Supervision for mobility/OOB    Equipment Recommendations  None recommended by PT    Recommendations for Other Services       Precautions / Restrictions Precautions Precautions: Fall Restrictions Weight Bearing Restrictions: No      Mobility  Bed Mobility Overal bed mobility: Needs Assistance Bed Mobility: Supine to Sit;Sit to Supine;Rolling Rolling: Min guard   Supine to sit: Mod assist Sit to supine: Min assist   General bed mobility comments: increased time, mod assist for trunk uprighting and min assist for BLE lifting, limited secondary to c/o pain  Transfers Overall transfer level: Needs assistance Equipment used: 1 person hand held assist Transfers: Sit to/from Stand Sit to Stand: Min guard         General transfer comment: refuses RW use, impulsive, verbal cues for hand placement  Ambulation/Gait Ambulation/Gait assistance: Min assist Gait Distance (Feet): 2 Feet Assistive device: 1 person hand held assist Gait Pattern/deviations: Shuffle;Trunk flexed Gait velocity: decreased   General Gait Details: limited to sidesteps at bedside, refuses RW use, impulsive, unsteadiness on feet, limited secondary to refusal to ambulate further  Stairs            Wheelchair Mobility    Modified Rankin (Stroke Patients Only)       Balance Overall balance assessment: Needs assistance Sitting-balance support: Feet supported;Bilateral upper extremity supported Sitting balance-Leahy Scale: Fair Sitting balance - Comments: seated EOB   Standing balance support: During functional activity;Single extremity supported Standing balance-Leahy Scale: Fair Standing balance comment: HHA  Pertinent Vitals/Pain Pain Score: 9  Pain Location: low back, bottom Pain Descriptors / Indicators: Aching;Sore Pain  Intervention(s): Limited activity within patient's tolerance;Monitored during session;Repositioned;Patient requesting pain meds-RN notified    Home Living Family/patient expects to be discharged to:: Private residence Living Arrangements: Alone Available Help at Discharge: Family;Personal care attendant Type of Home: House Home Access: Level entry     Home Layout: One level Home Equipment: Environmental consultant - 2 wheels;Cane - single point;Wheelchair - manual      Prior Function Level of Independence: Needs assistance(Information obtained from prior admission due to no family member and pt unable to recall)   Gait / Transfers Assistance Needed: Patient ambualtes around the home with increased incidence of falls  ADL's / Homemaking Assistance Needed: Patient has an aide present 5-7 hours/day on most days who assists with all household activities        Hand Dominance        Extremity/Trunk Assessment   Upper Extremity Assessment Upper Extremity Assessment: Overall WFL for tasks assessed    Lower Extremity Assessment Lower Extremity Assessment: Overall WFL for tasks assessed    Cervical / Trunk Assessment Cervical / Trunk Assessment: Kyphotic  Communication   Communication: No difficulties  Cognition Arousal/Alertness: Awake/alert Behavior During Therapy: WFL for tasks assessed/performed Overall Cognitive Status: History of cognitive impairments - at baseline                                        General Comments      Exercises     Assessment/Plan    PT Assessment Patient needs continued PT services  PT Problem List Decreased strength;Decreased activity tolerance;Decreased balance;Decreased mobility       PT Treatment Interventions Gait training;Functional mobility training;Therapeutic activities;Therapeutic exercise;Balance training;Patient/family education    PT Goals (Current goals can be found in the Care Plan section)  Acute Rehab PT  Goals Patient Stated Goal: return home PT Goal Formulation: With patient Time For Goal Achievement: 07/31/18 Potential to Achieve Goals: Good    Frequency Min 2X/week   Barriers to discharge        Co-evaluation               AM-PAC PT "6 Clicks" Mobility  Outcome Measure Help needed turning from your back to your side while in a flat bed without using bedrails?: A Lot Help needed moving from lying on your back to sitting on the side of a flat bed without using bedrails?: A Little Help needed moving to and from a bed to a chair (including a wheelchair)?: A Little Help needed standing up from a chair using your arms (e.g., wheelchair or bedside chair)?: A Little Help needed to walk in hospital room?: A Little Help needed climbing 3-5 steps with a railing? : A Lot 6 Click Score: 16    End of Session Equipment Utilized During Treatment: Oxygen(4  LPM) Activity Tolerance: Patient tolerated treatment well;Patient limited by fatigue;Patient limited by pain Patient left: in bed;with call bell/phone within reach;with bed alarm set Nurse Communication: Mobility status PT Visit Diagnosis: Unsteadiness on feet (R26.81);Other abnormalities of gait and mobility (R26.89);Muscle weakness (generalized) (M62.81)    Time: 1030-1100 PT Time Calculation (min) (ACUTE ONLY): 30 min   Charges:   PT Evaluation $PT Eval Moderate Complexity: 1 Mod PT Treatments $Therapeutic Activity: 8-22 mins        12:51 PM, 07/24/18 Talbot Grumbling, DPT  Physical Therapist with [facility] [phone] office

## 2018-07-25 DIAGNOSIS — D649 Anemia, unspecified: Secondary | ICD-10-CM

## 2018-07-25 LAB — RPR: RPR Ser Ql: NONREACTIVE

## 2018-07-25 LAB — CBC WITH DIFFERENTIAL/PLATELET
Abs Immature Granulocytes: 0.02 10*3/uL (ref 0.00–0.07)
Basophils Absolute: 0 10*3/uL (ref 0.0–0.1)
Basophils Relative: 0 %
Eosinophils Absolute: 0 10*3/uL (ref 0.0–0.5)
Eosinophils Relative: 0 %
HEMATOCRIT: 36.2 % (ref 36.0–46.0)
Hemoglobin: 10.6 g/dL — ABNORMAL LOW (ref 12.0–15.0)
Immature Granulocytes: 0 %
LYMPHS ABS: 1.3 10*3/uL (ref 0.7–4.0)
Lymphocytes Relative: 19 %
MCH: 29.7 pg (ref 26.0–34.0)
MCHC: 29.3 g/dL — ABNORMAL LOW (ref 30.0–36.0)
MCV: 101.4 fL — AB (ref 80.0–100.0)
MONOS PCT: 7 %
Monocytes Absolute: 0.5 10*3/uL (ref 0.1–1.0)
Neutro Abs: 5.1 10*3/uL (ref 1.7–7.7)
Neutrophils Relative %: 74 %
Platelets: 196 10*3/uL (ref 150–400)
RBC: 3.57 MIL/uL — ABNORMAL LOW (ref 3.87–5.11)
RDW: 13.2 % (ref 11.5–15.5)
WBC: 6.9 10*3/uL (ref 4.0–10.5)
nRBC: 0 % (ref 0.0–0.2)

## 2018-07-25 LAB — RENAL FUNCTION PANEL
Albumin: 2.7 g/dL — ABNORMAL LOW (ref 3.5–5.0)
Anion gap: 7 (ref 5–15)
BUN: 16 mg/dL (ref 8–23)
CO2: 22 mmol/L (ref 22–32)
Calcium: 11.8 mg/dL — ABNORMAL HIGH (ref 8.9–10.3)
Chloride: 112 mmol/L — ABNORMAL HIGH (ref 98–111)
Creatinine, Ser: 0.88 mg/dL (ref 0.44–1.00)
GFR calc Af Amer: >60 mL/min (ref >60)
GFR calc non Af Amer: >60 mL/min (ref >60)
Glucose, Bld: 81 mg/dL (ref 70–99)
POTASSIUM: 3.2 mmol/L — AB (ref 3.5–5.1)
Phosphorus: 1.7 mg/dL — ABNORMAL LOW (ref 2.5–4.6)
Sodium: 141 mmol/L (ref 135–145)

## 2018-07-25 LAB — PARATHYROID HORMONE, INTACT (NO CA): PTH: 5 pg/mL — AB (ref 15–65)

## 2018-07-25 LAB — BASIC METABOLIC PANEL
Anion gap: 7 (ref 5–15)
BUN: 16 mg/dL (ref 8–23)
CO2: 23 mmol/L (ref 22–32)
Calcium: 11.9 mg/dL — ABNORMAL HIGH (ref 8.9–10.3)
Chloride: 111 mmol/L (ref 98–111)
Creatinine, Ser: 0.86 mg/dL (ref 0.44–1.00)
GFR calc Af Amer: >60 mL/min (ref >60)
GFR calc non Af Amer: >60 mL/min (ref >60)
Glucose, Bld: 81 mg/dL (ref 70–99)
Potassium: 3.2 mmol/L — ABNORMAL LOW (ref 3.5–5.1)
SODIUM: 141 mmol/L (ref 135–145)

## 2018-07-25 LAB — HIV ANTIBODY (ROUTINE TESTING W REFLEX): HIV Screen 4th Generation wRfx: NONREACTIVE

## 2018-07-25 LAB — VITAMIN D 25 HYDROXY (VIT D DEFICIENCY, FRACTURES): Vit D, 25-Hydroxy: 42.5 ng/mL (ref 30.0–100.0)

## 2018-07-25 MED ORDER — POTASSIUM CHLORIDE CRYS ER 20 MEQ PO TBCR
40.0000 meq | EXTENDED_RELEASE_TABLET | Freq: Once | ORAL | Status: AC
  Administered 2018-07-27: 40 meq via ORAL
  Filled 2018-07-25: qty 2

## 2018-07-25 NOTE — Progress Notes (Signed)
PROGRESS NOTE    Ashley Hall  NAT:[id number] DOB: 1946-05-14 DOA: 07/23/2018 PCP: Lucia Gaskins, MD    Brief Narrative:  73 year old female with a history of COPD, chronic pain syndrome from scoliosis, was recently discharged from skilled nursing facility and brought to the emergency room with shortness of breath.  She was found by her home health physical therapist with an oxygen saturation 79% on room air.  She was admitted for acute on respiratory failure with hypoxia secondary to healthcare associated pneumonia.  Chest x-ray did show bilateral infiltrates.  She is been started on intravenous antibiotics.  Assessment & Plan:   Principal Problem:   CAP (community acquired pneumonia) Active Problems:   Essential hypertension   Tobacco abuse   Hypothyroidism   GERD (gastroesophageal reflux disease)   Altered mental status   Normocytic anemia   Hyperlipidemia   COPD exacerbation ([place])   1. Acute respiratory failure with hypoxia.  Secondary to pneumonia.  Continue to wean down oxygen as tolerated. 2. Healthcare associated pneumonia.  Continue on broad-spectrum antibiotics with cefepime and azithromycin.  Strep pneumo antigen is negative.  Blood cultures were not sent on admission. 3. COPD exacerbation.  Continue on intravenous Solu-Medrol.  Continue bronchodilators.  She is still wheezing and has rhonchi. 4. Altered mental status.  Likely related to hypoxia.  This is improved with oxygen supplementation and mental status appears to be approaching baseline.  CT scan of her head did not show any acute changes. 5. Anemia.  She is noted to have significant B12 deficiency.  She has been started on replacement therapy.  Hemoglobin has been stable 6. Hypertension.  Continue metoprolol.  Blood pressure stable. 7. Tobacco use.  Counseled on importance of tobacco cessation 8. GERD.  Continue on PPI.  9. Hyperlipidemia.  Continue statin. 10. Hypothyroidism.  Continue  Synthroid 11. Chronic pain syndrome.  Continued on home dose of pain medications. 12. Hypokalemia.  Replace   DVT prophylaxis: Lovenox Code Status: Full code Family Communication: No family present Disposition Plan: Discharge home once respiratory status has improved   Consultants:     Procedures:     Antimicrobials:   Cefepime 2/26 >  Azithromycin 2/25 >   Subjective: She does not feel any significant improvement in her breathing since yesterday.  Continues to complain of back pain.  Objective: Vitals:   07/25/18 0546 07/25/18 0618 07/25/18 0740 07/25/18 1343  BP: (!) 154/55 (!) 147/61    Pulse: 75 75    Resp: 18 15    Temp: 98 F (36.7 C) 98.4 F (36.9 C)    TempSrc: Oral Oral    SpO2: 97% 94% 92% 91%  Weight:      Height:        Intake/Output Summary (Last 24 hours) at 07/25/2018 1736 Last data filed at 07/25/2018 0400 Gross per 24 hour  Intake 1361.25 ml  Output -  Net 1361.25 ml   Filed Weights   07/23/18 1851 07/23/18 2059 07/24/18 1131  Weight: 50 kg 48.1 kg 53.5 kg    Examination:  General exam: Alert, awake, oriented x 3 Respiratory system: Scattered rhonchi and wheezes.  Increased respiratory effort. Cardiovascular system:RRR. No murmurs, rubs, gallops. Gastrointestinal system: Abdomen is nondistended, soft and nontender. No organomegaly or masses felt. Normal bowel sounds heard. Central nervous system: Alert and oriented. No focal neurological deficits. Extremities: No C/C/E, +pedal pulses Skin: No rashes, lesions or ulcers Psychiatry: Judgement and insight appear normal. Mood & affect appropriate.    Data Reviewed:  I have personally reviewed following labs and imaging studies  CBC: Recent Labs  Lab 07/23/18 1358 07/24/18 0459 07/25/18 0708  WBC 9.2 3.3* 6.9  NEUTROABS 7.7 2.9 5.1  HGB 10.0* 10.0* 10.6*  HCT 34.7* 34.1* 36.2  MCV 99.4 101.2* 101.4*  PLT 184 178 850   Basic Metabolic Panel: Recent Labs  Lab 07/23/18 1358  07/23/18 1643 07/24/18 0459 07/25/18 0708  NA 135  --  141 141  141  K 4.2  --  4.1 3.2*  3.2*  CL 105  --  111 112*  111  CO2 23  --  22 22  23   GLUCOSE 100*  --  130* 81  81  BUN 15  --  15 16  16   CREATININE 1.01*  --  1.05* 0.88  0.86  CALCIUM 12.6*  --  11.7* 11.8*  11.9*  MG  --  1.9  --   --   PHOS  --  2.7 2.9 1.7*   GFR: Estimated Creatinine Clearance: 47 mL/min (by C-G formula based on SCr of 0.88 mg/dL). Liver Function Tests: Recent Labs  Lab 07/23/18 1643 07/24/18 0459 07/25/18 0708  AST 42*  --   --   ALT 11  --   --   ALKPHOS 116  --   --   BILITOT 0.2*  --   --   PROT 6.8  --   --   ALBUMIN 3.0* 2.4* 2.7*   No results for input(s): LIPASE, AMYLASE in the last 168 hours. No results for input(s): AMMONIA in the last 168 hours. Coagulation Profile: No results for input(s): INR, PROTIME in the last 168 hours. Cardiac Enzymes: No results for input(s): CKTOTAL, CKMB, CKMBINDEX, TROPONINI in the last 168 hours. BNP (last 3 results) No results for input(s): PROBNP in the last 8760 hours. HbA1C: No results for input(s): HGBA1C in the last 72 hours. CBG: No results for input(s): GLUCAP in the last 168 hours. Lipid Profile: No results for input(s): CHOL, HDL, LDLCALC, TRIG, CHOLHDL, LDLDIRECT in the last 72 hours. Thyroid Function Tests: No results for input(s): TSH, T4TOTAL, FREET4, T3FREE, THYROIDAB in the last 72 hours. Anemia Panel: No results for input(s): VITAMINB12, FOLATE, FERRITIN, TIBC, IRON, RETICCTPCT in the last 72 hours. Sepsis Labs: No results for input(s): PROCALCITON, LATICACIDVEN in the last 168 hours.  No results found for this or any previous visit (from the past 240 hour(s)).       Radiology Studies: No results found.      Scheduled Meds: . busPIRone  5 mg Oral BID  . enoxaparin (LOVENOX) injection  40 mg Subcutaneous Q24H  . feeding supplement (ENSURE ENLIVE)  237 mL Oral BID BM  . ferrous gluconate  324 mg Oral Q  breakfast  . folic acid  1 mg Oral Daily  . gabapentin  200 mg Oral TID  . ipratropium-albuterol  3 mL Nebulization Q6H  . levothyroxine  50 mcg Oral q morning - 10a  . mouth rinse  15 mL Mouth Rinse BID  . methadone  2.5 mg Oral BID  . metoprolol tartrate  25 mg Oral BID  . multivitamin with minerals  1 tablet Oral Daily  . oxybutynin  5 mg Oral Daily  . pantoprazole  40 mg Oral BID  . PARoxetine  20 mg Oral q morning - 10a  . pravastatin  40 mg Oral q morning - 10a  . topiramate  100 mg Oral Daily  . traZODone  50 mg Oral QHS  Continuous Infusions: . sodium chloride 500 mL (07/23/18 1823)  . sodium chloride 100 mL/hr at 07/25/18 1324  . azithromycin Stopped (07/24/18 2207)  . ceFEPime (MAXIPIME) IV 2 g (07/25/18 1649)     LOS: 2 days    Time spent: 54mins    Kathie Dike, MD [facility]   If 7PM-7AM, please contact night-coverage [email or web address]  07/25/2018, 5:36 PM

## 2018-07-25 NOTE — Progress Notes (Signed)
OT Cancellation Note  Patient Details Name: Ashley Hall MRN: [id number] DOB: 1946/03/12   Cancelled Treatment:    Reason Eval/Treat Not Completed: Patient at procedure or test/ unavailable. RT working with pt on OT arrival. Will check back at a later time.   Guadelupe Sabin, OTR/L  [phone] 07/25/2018, 7:59 AM

## 2018-07-25 NOTE — Care Management Note (Signed)
Case Management Note  Patient Details  Name: Ashley Hall MRN: [id number] Date of Birth: 22-Nov-1945  Subjective/Objective:     Admitted with HCAP. Pt from home, recently [place] from [facility]. Now active with [facility] for nursing and PT services. Pt plans to resume [place] services at DC. Pt on oxygen acutely.                Action/Plan: [facility] rep aware of admission. Will cont to follow. May need home O2 assessment prior to DC.  Expected Discharge Date:      07/27/2018            Expected Discharge Plan:  [place]  In-House Referral:  NA  Discharge planning Services  CM Consult  Post Acute Care Choice:  Home Health, Resumption of Svcs/PTA Provider Choice offered to:  NA  HH Arranged:  RN, PT [facility] Agency:  [place]  Status of Service:  In process, will continue to follow  If discussed at Long Length of Stay Meetings, dates discussed:    Additional Comments:  Sherald Barge, RN 07/25/2018, 1:52 PM

## 2018-07-26 LAB — RENAL FUNCTION PANEL
Albumin: 2.3 g/dL — ABNORMAL LOW (ref 3.5–5.0)
Anion gap: 6 (ref 5–15)
BUN: 8 mg/dL (ref 8–23)
CO2: 22 mmol/L (ref 22–32)
CREATININE: 0.71 mg/dL (ref 0.44–1.00)
Calcium: 11.1 mg/dL — ABNORMAL HIGH (ref 8.9–10.3)
Chloride: 113 mmol/L — ABNORMAL HIGH (ref 98–111)
GFR calc Af Amer: >60 mL/min (ref >60)
GFR calc non Af Amer: >60 mL/min (ref >60)
GLUCOSE: 91 mg/dL (ref 70–99)
Phosphorus: 1.8 mg/dL — ABNORMAL LOW (ref 2.5–4.6)
Potassium: 3.4 mmol/L — ABNORMAL LOW (ref 3.5–5.1)
Sodium: 141 mmol/L (ref 135–145)

## 2018-07-26 LAB — CBC WITH DIFFERENTIAL/PLATELET
Basophils Absolute: 0 10*3/uL (ref 0.0–0.1)
Basophils Relative: 0 %
EOS ABS: 0.1 10*3/uL (ref 0.0–0.5)
Eosinophils Relative: 0 %
HCT: 31.8 % — ABNORMAL LOW (ref 36.0–46.0)
Hemoglobin: 9.4 g/dL — ABNORMAL LOW (ref 12.0–15.0)
Lymphocytes Relative: 25 %
Lymphs Abs: 1.3 10*3/uL (ref 0.7–4.0)
MCH: 29.2 pg (ref 26.0–34.0)
MCHC: 29.6 g/dL — AB (ref 30.0–36.0)
MCV: 98.8 fL (ref 80.0–100.0)
Monocytes Absolute: 0.4 10*3/uL (ref 0.1–1.0)
Monocytes Relative: 7 %
Neutro Abs: 3.5 10*3/uL (ref 1.7–7.7)
Neutrophils Relative %: 67 %
Platelets: 163 10*3/uL (ref 150–400)
RBC: 3.22 MIL/uL — ABNORMAL LOW (ref 3.87–5.11)
RDW: 13.2 % (ref 11.5–15.5)
WBC: 5.3 10*3/uL (ref 4.0–10.5)
nRBC: 0 % (ref 0.0–0.2)
nRBC: 0 /100 WBC

## 2018-07-26 LAB — PATHOLOGIST SMEAR REVIEW

## 2018-07-26 LAB — MAGNESIUM: Magnesium: 1.6 mg/dL — ABNORMAL LOW (ref 1.7–2.4)

## 2018-07-26 MED ORDER — POTASSIUM PHOSPHATE MONOBASIC 500 MG PO TABS
500.0000 mg | ORAL_TABLET | Freq: Three times a day (TID) | ORAL | Status: DC
  Filled 2018-07-26 (×11): qty 1

## 2018-07-26 MED ORDER — AMLODIPINE BESYLATE 5 MG PO TABS
5.0000 mg | ORAL_TABLET | Freq: Every day | ORAL | Status: DC
  Administered 2018-07-26 – 2018-07-31 (×6): 5 mg via ORAL
  Filled 2018-07-26 (×6): qty 1

## 2018-07-26 MED ORDER — MAGNESIUM SULFATE 2 GM/50ML IV SOLN
2.0000 g | Freq: Once | INTRAVENOUS | Status: AC
  Administered 2018-07-26: 2 g via INTRAVENOUS
  Filled 2018-07-26: qty 50

## 2018-07-26 NOTE — Progress Notes (Signed)
Pt refused treatment until pain medicine and water is given. Pt screaming and cussing. RN notified

## 2018-07-26 NOTE — Evaluation (Signed)
Occupational Therapy Evaluation Patient Details Name: Ashley Hall MRN: [id number] DOB: 1946/02/22 Today's Date: 07/26/2018    History of Present Illness Ashley Hall is a 73 y.o. female with medical history significant of GERD, alcohol abuse in remission, osteoarthritis, bipolar 1 disorder, depression, personality disorder, history of anxiolytic dependence, chronic back pain, chronic constipation, COPD, tobacco abuse, hypertension, hyperlipidemia, hypothyroidism, B12 deficiency, history of seizures who is coming to the emergency department after her home health nurse found her at home hypoxic with an O2 sat of 79% and called EMS.  The patient states that she has had memory problems for years and was unable to provide any history.   Clinical Impression   Pt received supine in bed, initially agreeable to OT evaluation. Pt refusing to perform bed mobility or transfer tasks, OT offering use of BSC, sitting up in chair, etc. Extensive education on benefit of mobility and getting out of bed, pt with various excuses. Pt appears to be close to baseline with ADLs, has aide daily. Recommend HHOT services to assess pt functioning in home environment and provide treatment for improving safety and independence in ADLs if necessary. No further acute OT services required at this time.     Follow Up Recommendations  Home health OT;Supervision/Assistance - 24 hour    Equipment Recommendations  None recommended by OT       Precautions / Restrictions Precautions Precautions: Fall Restrictions Weight Bearing Restrictions: No      Mobility Bed Mobility Overal bed mobility: Needs Assistance Bed Mobility: Rolling Rolling: Supervision         General bed mobility comments: Pt able to roll to each side for changing bed linens twice during session.  Refused to sit at [facility]  Transfers                 General transfer comment: pt refused         ADL either performed or assessed with  clinical judgement   ADL Overall ADL's : Needs assistance/impaired                                       General ADL Comments: Pt able to perform UB bed level/seated ADLs with set-up. Requiring greater assistance with LB tasks due to weakness and cognition     Vision Baseline Vision/History: Wears glasses Wears Glasses: At all times Patient Visual Report: No change from baseline Vision Assessment?: No apparent visual deficits            Pertinent Vitals/Pain Pain Assessment: 0-10 Pain Score: 5  Pain Location: low back, bottom Pain Descriptors / Indicators: Aching;Sore Pain Intervention(s): Limited activity within patient's tolerance;Monitored during session;Repositioned     Hand Dominance Right   Extremity/Trunk Assessment Upper Extremity Assessment Upper Extremity Assessment: Generalized weakness   Lower Extremity Assessment Lower Extremity Assessment: Defer to PT evaluation   Cervical / Trunk Assessment Cervical / Trunk Assessment: Kyphotic   Communication Communication Communication: No difficulties   Cognition Arousal/Alertness: Awake/alert Behavior During Therapy: WFL for tasks assessed/performed Overall Cognitive Status: History of cognitive impairments - at baseline                                                Home Living Family/patient expects to be discharged to:: Private  residence Living Arrangements: Alone Available Help at Discharge: Family;Personal care attendant Type of Home: House Home Access: Level entry     [place]: One level     Bathroom Shower/Tub: Teacher, early years/pre: [place]: Environmental consultant - 2 wheels;Cane - single point;Wheelchair - manual          Prior Functioning/Environment Level of Independence: Needs assistance  Gait / Transfers Assistance Needed: Patient ambulates around the home with increased incidence of falls ADL's / Homemaking Assistance Needed:  Patient has an aide present 5-7 hours/day on most days who assists with all household activities            OT Problem List: Decreased strength;Decreased activity tolerance;Impaired balance (sitting and/or standing);Decreased safety awareness;Decreased knowledge of use of DME or AE       End of Session Equipment Utilized During Treatment: Oxygen Nurse Communication: Other (comment)(Purewick off)  Activity Tolerance: Patient tolerated treatment well Patient left: in bed;with call bell/phone within reach;with bed alarm set  OT Visit Diagnosis: Repeated falls (R29.6);Muscle weakness (generalized) (M62.81)                Time: 6967-8938 OT Time Calculation (min): 37 min Charges:  OT General Charges $OT Visit: 1 Visit OT Evaluation $OT Eval Moderate Complexity: [place], OTR/L  [phone] 07/26/2018, 10:21 AM

## 2018-07-26 NOTE — Care Management Important Message (Signed)
Important Message  Patient Details  Name: Ashley Hall MRN: [id number] Date of Birth: 13-Jul-1945   Medicare Important Message Given:  Yes    Sherald Barge, RN 07/26/2018, 3:43 PM

## 2018-07-26 NOTE — Progress Notes (Signed)
PROGRESS NOTE    Ashley Hall  GBT:[id number] DOB: 1946/03/29 DOA: 07/23/2018 PCP: Lucia Gaskins, MD    Brief Narrative:  73 year old female with a history of COPD, chronic pain syndrome from scoliosis, was recently discharged from skilled nursing facility and brought to the emergency room with shortness of breath.  She was found by her home health physical therapist with an oxygen saturation 79% on room air.  She was admitted for acute on respiratory failure with hypoxia secondary to healthcare associated pneumonia.  Chest x-ray did show bilateral infiltrates.  She is been started on intravenous antibiotics.  Assessment & Plan:   Principal Problem:   CAP (community acquired pneumonia) Active Problems:   Essential hypertension   Tobacco abuse   Hypothyroidism   GERD (gastroesophageal reflux disease)   Altered mental status   Normocytic anemia   Hyperlipidemia   COPD exacerbation ([place])   1. Acute respiratory failure with hypoxia.  Secondary to pneumonia.  Continue to wean down oxygen as tolerated. 2. Healthcare associated pneumonia.  Continue on broad-spectrum antibiotics with cefepime and azithromycin.  Strep pneumo antigen is negative.  Blood cultures were not sent on admission. 3. COPD exacerbation.  Continues to have rhonchi and wheezing.  Continue on intravenous Solu-Medrol.  Continue bronchodilators.   4. Altered mental status.  Likely related to hypoxia.  This is improved with oxygen supplementation and mental status appears to be approaching baseline.  CT scan of her head did not show any acute changes. 5. Anemia.  She is noted to have significant B12 deficiency.  She has been started on replacement therapy.  Hemoglobin has been stable 6. Hypertension.  Continue metoprolol.  Blood pressure stable. 7. Tobacco use.  Counseled on importance of tobacco cessation 8. GERD.  Continue on PPI.  9. Hyperlipidemia.  Continue statin. 10. Hypothyroidism.  Continue  Synthroid 11. Chronic pain syndrome.  Continued on home dose of pain medications. 12. Hypokalemia.  Replace 13. Hypercalcemia.  PTH levels noted to be low at 5.  Checking PTH related peptide.   DVT prophylaxis: Lovenox Code Status: Full code Family Communication: No family present Disposition Plan: Discharge home once respiratory status has improved   Consultants:     Procedures:     Antimicrobials:   Cefepime 2/26 >  Azithromycin 2/25 >   Subjective: She has several complaints.  Mostly related around not receiving her pain medication in a timely manner.  Continues to have productive cough.  Objective: Vitals:   07/26/18 0749 07/26/18 0957 07/26/18 1709 07/26/18 1920  BP:  (!) 152/63 (!) 175/69   Pulse:  81 77   Resp:  16 16   Temp:  (!) 97.4 F (36.3 C) 98.2 F (36.8 C)   TempSrc:  Oral    SpO2: 96% 98% 100% 97%  Weight:      Height:        Intake/Output Summary (Last 24 hours) at 07/26/2018 1947 Last data filed at 07/26/2018 1900 Gross per 24 hour  Intake 5159.02 ml  Output 4000 ml  Net 1159.02 ml   Filed Weights   07/23/18 1851 07/23/18 2059 07/24/18 1131  Weight: 50 kg 48.1 kg 53.5 kg    Examination:  General exam: Alert, awake, oriented x 3 Respiratory system: Bilateral rhonchi, mild wheeze. Respiratory effort normal. Cardiovascular system:RRR. No murmurs, rubs, gallops. Gastrointestinal system: Abdomen is nondistended, soft and nontender. No organomegaly or masses felt. Normal bowel sounds heard. Central nervous system: Alert and oriented. No focal neurological deficits. Extremities: No C/C/E, +pedal  pulses Skin: No rashes, lesions or ulcers Psychiatry: Judgement and insight appear normal. Mood & affect appropriate.     Data Reviewed: I have personally reviewed following labs and imaging studies  CBC: Recent Labs  Lab 07/23/18 1358 07/24/18 0459 07/25/18 0708 07/26/18 0631  WBC 9.2 3.3* 6.9 5.3  NEUTROABS 7.7 2.9 5.1 3.5  HGB 10.0*  10.0* 10.6* 9.4*  HCT 34.7* 34.1* 36.2 31.8*  MCV 99.4 101.2* 101.4* 98.8  PLT 184 178 196 599   Basic Metabolic Panel: Recent Labs  Lab 07/23/18 1358 07/23/18 1643 07/24/18 0459 07/25/18 0708 07/26/18 0631  NA 135  --  141 141  141 141  K 4.2  --  4.1 3.2*  3.2* 3.4*  CL 105  --  111 112*  111 113*  CO2 23  --  22 22  23 22   GLUCOSE 100*  --  130* 81  81 91  BUN 15  --  15 16  16 8   CREATININE 1.01*  --  1.05* 0.88  0.86 0.71  CALCIUM 12.6*  --  11.7* 11.8*  11.9* 11.1*  MG  --  1.9  --   --  1.6*  PHOS  --  2.7 2.9 1.7* 1.8*   GFR: Estimated Creatinine Clearance: 51.8 mL/min (by C-G formula based on SCr of 0.71 mg/dL). Liver Function Tests: Recent Labs  Lab 07/23/18 1643 07/24/18 0459 07/25/18 0708 07/26/18 0631  AST 42*  --   --   --   ALT 11  --   --   --   ALKPHOS 116  --   --   --   BILITOT 0.2*  --   --   --   PROT 6.8  --   --   --   ALBUMIN 3.0* 2.4* 2.7* 2.3*   No results for input(s): LIPASE, AMYLASE in the last 168 hours. No results for input(s): AMMONIA in the last 168 hours. Coagulation Profile: No results for input(s): INR, PROTIME in the last 168 hours. Cardiac Enzymes: No results for input(s): CKTOTAL, CKMB, CKMBINDEX, TROPONINI in the last 168 hours. BNP (last 3 results) No results for input(s): PROBNP in the last 8760 hours. HbA1C: No results for input(s): HGBA1C in the last 72 hours. CBG: No results for input(s): GLUCAP in the last 168 hours. Lipid Profile: No results for input(s): CHOL, HDL, LDLCALC, TRIG, CHOLHDL, LDLDIRECT in the last 72 hours. Thyroid Function Tests: No results for input(s): TSH, T4TOTAL, FREET4, T3FREE, THYROIDAB in the last 72 hours. Anemia Panel: No results for input(s): VITAMINB12, FOLATE, FERRITIN, TIBC, IRON, RETICCTPCT in the last 72 hours. Sepsis Labs: No results for input(s): PROCALCITON, LATICACIDVEN in the last 168 hours.  No results found for this or any previous visit (from the past 240  hour(s)).       Radiology Studies: No results found.      Scheduled Meds: . amLODipine  5 mg Oral Daily  . busPIRone  5 mg Oral BID  . enoxaparin (LOVENOX) injection  40 mg Subcutaneous Q24H  . feeding supplement (ENSURE ENLIVE)  237 mL Oral BID BM  . ferrous gluconate  324 mg Oral Q breakfast  . folic acid  1 mg Oral Daily  . gabapentin  200 mg Oral TID  . ipratropium-albuterol  3 mL Nebulization Q6H  . levothyroxine  50 mcg Oral q morning - 10a  . mouth rinse  15 mL Mouth Rinse BID  . metoprolol tartrate  25 mg Oral BID  . multivitamin  with minerals  1 tablet Oral Daily  . oxybutynin  5 mg Oral Daily  . pantoprazole  40 mg Oral BID  . PARoxetine  20 mg Oral q morning - 10a  . potassium chloride  40 mEq Oral Once  . pravastatin  40 mg Oral q morning - 10a  . topiramate  100 mg Oral Daily  . traZODone  50 mg Oral QHS   Continuous Infusions: . sodium chloride 500 mL (07/23/18 1823)  . sodium chloride 100 mL/hr at 07/26/18 1727  . azithromycin Stopped (07/25/18 2225)  . ceFEPime (MAXIPIME) IV Stopped (07/26/18 1638)     LOS: 3 days    Time spent: 75mins    Kathie Dike, MD [facility]   If 7PM-7AM, please contact night-coverage [email or web address]  07/26/2018, 7:47 PM

## 2018-07-26 NOTE — Progress Notes (Signed)
Physical Therapy Treatment Patient Details Name: Ashley Hall MRN: [id number] DOB: [birth year] Today's Date: 07/26/2018    History of Present Illness Ashley Hall is a 73 y.o. female with medical history significant of GERD, alcohol abuse in remission, osteoarthritis, bipolar 1 disorder, depression, personality disorder, history of anxiolytic dependence, chronic back pain, chronic constipation, COPD, tobacco abuse, hypertension, hyperlipidemia, hypothyroidism, B12 deficiency, history of seizures who is coming to the emergency department after her home health nurse found her at home hypoxic with an O2 sat of 79% and called EMS.  The patient states that she has had memory problems for years and was unable to provide any history.    PT Comments    Patient presents supine in bed and agreeable to therapy. Pt on 3LPM and O2 sat 97% with mobility. Pt refuses to stand up or ambulate today. Pt soiled linens and able to perform rolling in bed to change linens with tactile and verbal cues for hand placement. Pt requires min assist for sitting up to EOB and returning to sitting. Pt impulsive while performing seated BLE strengthening exercises at EOB, attempting to return to sidelying or supine without warning requiring min assist to prevent pt from going down onto bedrails or at unsafe distance to EOB. Pt tolerates sitting up EOB for 5 min while performing exercises before requiring return to supine secondary to c/o low back pain. Pt left supine in bed, bed alarm on, call bell in hand and nutrition in room receiving pt's lunch order. Pt continues to be limited by cognition and attitude towards therapy. Patient will benefit from continued physical therapy in hospital and recommended venue below to increase strength, balance, endurance for safe ADLs and gait.    Follow Up Recommendations  Home health PT;Supervision/Assistance - 24 hour;Supervision for mobility/OOB     Equipment Recommendations  None  recommended by PT    Recommendations for Other Services       Precautions / Restrictions Precautions Precautions: Fall Restrictions Weight Bearing Restrictions: No    Mobility  Bed Mobility Overal bed mobility: Needs Assistance Bed Mobility: Rolling Rolling: Supervision   Supine to sit: Min assist Sit to supine: Min assist   General bed mobility comments: increased time, tactile cues and verbal cues for hand placement  Transfers                 General transfer comment: Pt refused to stand or ambulate today  Ambulation/Gait                 Stairs             Wheelchair Mobility    Modified Rankin (Stroke Patients Only)       Balance Overall balance assessment: Needs assistance Sitting-balance support: Feet supported;Bilateral upper extremity supported Sitting balance-Leahy Scale: Poor Sitting balance - Comments: fair/poor seated EOB, leaning in all directions, impuslive to return to supine or sidelying without warning       Standing balance comment: pt refused to stand or ambulate today                            Cognition Arousal/Alertness: Awake/alert Behavior During Therapy: WFL for tasks assessed/performed Overall Cognitive Status: History of cognitive impairments - at baseline  Exercises General Exercises - Lower Extremity Toe Raises: Seated;AROM;Strengthening;Both;10 reps Heel Raises: Seated;AROM;Strengthening;Both;10 reps    General Comments        Pertinent Vitals/Pain Pain Assessment: Faces Pain Score: 5  Faces Pain Scale: Hurts a little bit Pain Location: low back, bottom Pain Descriptors / Indicators: Aching;Sore Pain Intervention(s): Limited activity within patient's tolerance;Monitored during session;Repositioned;Patient requesting pain meds-RN notified    Home Living Family/patient expects to be discharged to:: Private residence Living  Arrangements: Alone Available Help at Discharge: Family;Personal care attendant Type of Home: House Home Access: Level entry   Home Layout: One level Home Equipment: Environmental consultant - 2 wheels;Cane - single point;Wheelchair - manual      Prior Function Level of Independence: Needs assistance  Gait / Transfers Assistance Needed: Patient ambulates around the home with increased incidence of falls ADL's / Homemaking Assistance Needed: Patient has an aide present 5-7 hours/day on most days who assists with all household activities     PT Goals (current goals can now be found in the care plan section) Acute Rehab PT Goals Patient Stated Goal: return home PT Goal Formulation: With patient Time For Goal Achievement: 07/31/18 Potential to Achieve Goals: Good Progress towards PT goals: Progressing toward goals    Frequency    Min 2X/week      PT Plan Current plan remains appropriate    Co-evaluation              AM-PAC PT "6 Clicks" Mobility   Outcome Measure  Help needed turning from your back to your side while in a flat bed without using bedrails?: A Little Help needed moving from lying on your back to sitting on the side of a flat bed without using bedrails?: A Little Help needed moving to and from a bed to a chair (including a wheelchair)?: A Little Help needed standing up from a chair using your arms (e.g., wheelchair or bedside chair)?: A Little Help needed to walk in hospital room?: A Little Help needed climbing 3-5 steps with a railing? : A Lot 6 Click Score: 17    End of Session Equipment Utilized During Treatment: Oxygen(3 LPM) Activity Tolerance: Patient tolerated treatment well;Patient limited by fatigue;Patient limited by pain Patient left: in bed;with call bell/phone within reach;with bed alarm set;with family/visitor present(Nutrition in room) Nurse Communication: Mobility status PT Visit Diagnosis: Unsteadiness on feet (R26.81);Other abnormalities of gait and  mobility (R26.89);Muscle weakness (generalized) (M62.81)     Time: 3154-0086 PT Time Calculation (min) (ACUTE ONLY): 28 min  Charges:  $Therapeutic Activity: 23-37 mins                     12:41 PM, 07/26/18 Talbot Grumbling, DPT Physical Therapist with [facility] [phone] office

## 2018-07-27 ENCOUNTER — Inpatient Hospital Stay (HOSPITAL_COMMUNITY): Payer: Medicare Other

## 2018-07-27 LAB — COMPREHENSIVE METABOLIC PANEL
ALT: 13 U/L (ref 0–44)
AST: 38 U/L (ref 15–41)
Albumin: 2.2 g/dL — ABNORMAL LOW (ref 3.5–5.0)
Alkaline Phosphatase: 87 U/L (ref 38–126)
Anion gap: 6 (ref 5–15)
BUN: 7 mg/dL — ABNORMAL LOW (ref 8–23)
CO2: 24 mmol/L (ref 22–32)
Calcium: 11.3 mg/dL — ABNORMAL HIGH (ref 8.9–10.3)
Chloride: 113 mmol/L — ABNORMAL HIGH (ref 98–111)
Creatinine, Ser: 0.61 mg/dL (ref 0.44–1.00)
GFR calc non Af Amer: >60 mL/min (ref >60)
Glucose, Bld: 94 mg/dL (ref 70–99)
Potassium: 3 mmol/L — ABNORMAL LOW (ref 3.5–5.1)
Sodium: 143 mmol/L (ref 135–145)
Total Bilirubin: 0.3 mg/dL (ref 0.3–1.2)
Total Protein: 5.6 g/dL — ABNORMAL LOW (ref 6.5–8.1)

## 2018-07-27 MED ORDER — IOHEXOL 300 MG/ML  SOLN
75.0000 mL | Freq: Once | INTRAMUSCULAR | Status: AC | PRN
  Administered 2018-07-27: 75 mL via INTRAVENOUS

## 2018-07-27 MED ORDER — K PHOS MONO-SOD PHOS DI & MONO 155-852-130 MG PO TABS
500.0000 mg | ORAL_TABLET | Freq: Three times a day (TID) | ORAL | Status: DC
  Administered 2018-07-27 – 2018-07-31 (×14): 500 mg via ORAL
  Filled 2018-07-27 (×13): qty 2

## 2018-07-27 MED ORDER — POTASSIUM CHLORIDE CRYS ER 20 MEQ PO TBCR
40.0000 meq | EXTENDED_RELEASE_TABLET | ORAL | Status: AC
  Administered 2018-07-27 (×2): 40 meq via ORAL
  Filled 2018-07-27 (×3): qty 2

## 2018-07-27 NOTE — Progress Notes (Addendum)
PROGRESS NOTE    Ashley Hall  DDU:[id number] DOB: 11-Dec-1945 DOA: 07/23/2018 PCP: Lucia Gaskins, MD    Brief Narrative:  73 year old female with a history of COPD, chronic pain syndrome from scoliosis, was recently discharged from skilled nursing facility and brought to the emergency room with shortness of breath.  She was found by her home health physical therapist with an oxygen saturation 79% on room air.  She was admitted for acute on respiratory failure with hypoxia secondary to healthcare associated pneumonia.  Chest x-ray did show bilateral infiltrates.  She is been started on intravenous antibiotics.  Assessment & Plan:   Principal Problem:   CAP (community acquired pneumonia) Active Problems:   Essential hypertension   Tobacco abuse   Hypothyroidism   GERD (gastroesophageal reflux disease)   Altered mental status   Normocytic anemia   Hyperlipidemia   COPD exacerbation ([place])   1. Acute respiratory failure with hypoxia.  Secondary to pneumonia.  Continue to wean down oxygen as tolerated. 2. Healthcare associated pneumonia.  Continue on broad-spectrum antibiotics with cefepime and azithromycin.  Strep pneumo antigen is negative.  Blood cultures were not sent on admission. 3. COPD exacerbation.  Continues to have rhonchi and wheezing.  Continue on intravenous Solu-Medrol.  Continue bronchodilators.   4. Acute encephalopathy related to hypoxia.  This is improved with oxygen supplementation and mental status appears to be approaching baseline.  CT scan of her head did not show any acute changes. 5. Anemia.  She is noted to have significant B12 deficiency.  She has been started on replacement therapy.  Hemoglobin has been stable 6. Hypertension.  Continue metoprolol.  Blood pressure stable. 7. Tobacco use.  Counseled on importance of tobacco cessation 8. GERD.  Continue on PPI.  9. Hyperlipidemia.  Continue statin. 10. Hypothyroidism.  Continue Synthroid 11. Chronic  pain syndrome.  Continued on home dose of pain medications. 12. Hypokalemia.  Replace 13. Hypercalcemia.  PTH levels noted to be low at 5.  Vitamin D 25-hydroxy level was normal at 42.5.  Will check vitamin D 1, 25, SPEP, kappa/lambda light chains, and CT chest to rule out any underlying malignancy.  PTH-RP currently in process  14. Polyuria.  Patient had approximately 5 L of urine output yesterday.  ?diabetes insipidus.  She is not on any medications that would induce this.  Will check serum osmolarity, urine osmolarity.  Check urine sodium.   DVT prophylaxis: Lovenox Code Status: Full code Family Communication: No family present Disposition Plan: Discharge home once respiratory status has improved   Consultants:     Procedures:     Antimicrobials:   Cefepime 2/26 >  Azithromycin 2/25 >   Subjective: Continues to have cough.  Does not feel that her breathing is back to baseline.  Objective: Vitals:   07/27/18 0705 07/27/18 0743 07/27/18 1430 07/27/18 1441  BP: (!) 158/59   (!) 148/65  Pulse: 76   79  Resp: 20   16  Temp: 99.2 F (37.3 C)   99.5 F (37.5 C)  TempSrc: Oral   Oral  SpO2: 94% 93% (!) 89% 96%  Weight:      Height:        Intake/Output Summary (Last 24 hours) at 07/27/2018 1646 Last data filed at 07/27/2018 1100 Gross per 24 hour  Intake 958.59 ml  Output 4900 ml  Net -3941.41 ml   Filed Weights   07/23/18 1851 07/23/18 2059 07/24/18 1131  Weight: 50 kg 48.1 kg 53.5 kg  Examination:  General exam: Alert, awake, oriented x 3 Respiratory system: Bilateral rhonchi. Respiratory effort normal. Cardiovascular system:RRR. No murmurs, rubs, gallops. Gastrointestinal system: Abdomen is nondistended, soft and nontender. No organomegaly or masses felt. Normal bowel sounds heard. Central nervous system: Alert and oriented. No focal neurological deficits. Extremities: No C/C/E, +pedal pulses Skin: No rashes, lesions or ulcers Psychiatry: Judgement and  insight appear normal. Mood & affect appropriate.   Data Reviewed: I have personally reviewed following labs and imaging studies  CBC: Recent Labs  Lab 07/23/18 1358 07/24/18 0459 07/25/18 0708 07/26/18 0631  WBC 9.2 3.3* 6.9 5.3  NEUTROABS 7.7 2.9 5.1 3.5  HGB 10.0* 10.0* 10.6* 9.4*  HCT 34.7* 34.1* 36.2 31.8*  MCV 99.4 101.2* 101.4* 98.8  PLT 184 178 196 203   Basic Metabolic Panel: Recent Labs  Lab 07/23/18 1358 07/23/18 1643 07/24/18 0459 07/25/18 0708 07/26/18 0631 07/27/18 0630  NA 135  --  141 141  141 141 143  K 4.2  --  4.1 3.2*  3.2* 3.4* 3.0*  CL 105  --  111 112*  111 113* 113*  CO2 23  --  22 22  23 22 24   GLUCOSE 100*  --  130* 81  81 91 94  BUN 15  --  15 16  16 8  7*  CREATININE 1.01*  --  1.05* 0.88  0.86 0.71 0.61  CALCIUM 12.6*  --  11.7* 11.8*  11.9* 11.1* 11.3*  MG  --  1.9  --   --  1.6*  --   PHOS  --  2.7 2.9 1.7* 1.8*  --    GFR: Estimated Creatinine Clearance: 51.8 mL/min (by C-G formula based on SCr of 0.61 mg/dL). Liver Function Tests: Recent Labs  Lab 07/23/18 1643 07/24/18 0459 07/25/18 0708 07/26/18 0631 07/27/18 0630  AST 42*  --   --   --  38  ALT 11  --   --   --  13  ALKPHOS 116  --   --   --  87  BILITOT 0.2*  --   --   --  0.3  PROT 6.8  --   --   --  5.6*  ALBUMIN 3.0* 2.4* 2.7* 2.3* 2.2*   No results for input(s): LIPASE, AMYLASE in the last 168 hours. No results for input(s): AMMONIA in the last 168 hours. Coagulation Profile: No results for input(s): INR, PROTIME in the last 168 hours. Cardiac Enzymes: No results for input(s): CKTOTAL, CKMB, CKMBINDEX, TROPONINI in the last 168 hours. BNP (last 3 results) No results for input(s): PROBNP in the last 8760 hours. HbA1C: No results for input(s): HGBA1C in the last 72 hours. CBG: No results for input(s): GLUCAP in the last 168 hours. Lipid Profile: No results for input(s): CHOL, HDL, LDLCALC, TRIG, CHOLHDL, LDLDIRECT in the last 72 hours. Thyroid Function  Tests: No results for input(s): TSH, T4TOTAL, FREET4, T3FREE, THYROIDAB in the last 72 hours. Anemia Panel: No results for input(s): VITAMINB12, FOLATE, FERRITIN, TIBC, IRON, RETICCTPCT in the last 72 hours. Sepsis Labs: No results for input(s): PROCALCITON, LATICACIDVEN in the last 168 hours.  No results found for this or any previous visit (from the past 240 hour(s)).       Radiology Studies: No results found.      Scheduled Meds: . amLODipine  5 mg Oral Daily  . busPIRone  5 mg Oral BID  . enoxaparin (LOVENOX) injection  40 mg Subcutaneous Q24H  . feeding supplement (ENSURE  ENLIVE)  237 mL Oral BID BM  . ferrous gluconate  324 mg Oral Q breakfast  . folic acid  1 mg Oral Daily  . gabapentin  200 mg Oral TID  . ipratropium-albuterol  3 mL Nebulization Q6H  . levothyroxine  50 mcg Oral q morning - 10a  . mouth rinse  15 mL Mouth Rinse BID  . metoprolol tartrate  25 mg Oral BID  . multivitamin with minerals  1 tablet Oral Daily  . oxybutynin  5 mg Oral Daily  . pantoprazole  40 mg Oral BID  . PARoxetine  20 mg Oral q morning - 10a  . phosphorus  500 mg Oral TID WC & HS  . pravastatin  40 mg Oral q morning - 10a  . topiramate  100 mg Oral Daily  . traZODone  50 mg Oral QHS   Continuous Infusions: . sodium chloride 500 mL (07/23/18 1823)  . sodium chloride 100 mL/hr at 07/27/18 1126  . azithromycin 500 mg (07/26/18 2211)  . ceFEPime (MAXIPIME) IV 2 g (07/27/18 1557)     LOS: 4 days    Time spent: 31mins    Kathie Dike, MD [facility]   If 7PM-7AM, please contact night-coverage [email or web address]  07/27/2018, 4:46 PM

## 2018-07-27 NOTE — Progress Notes (Signed)
Pharmacy Antibiotic Note  Today is day #4 of cefepime and azithromycin therapy for this 73 yo female with HCAP.  Patient is currently afebrile and WBC count is WNL. No cultures have been ordered at this time. Renal function remains unstable.  Plan: Continue cefepime 2gm IV q24h-  f/u de-escalation to oral antibiotics when ready F/U cxs and clinical progress Monitor V/S, labs  Height: 5\' 2"  (157.5 cm) Weight: 117 lb 15.1 oz (53.5 kg)(Bed weight) IBW/kg (Calculated) : 50.1  Temp (24hrs), Avg:98.6 F (37 C), Min:98.2 F (36.8 C), Max:99.2 F (37.3 C)  Recent Labs  Lab 07/23/18 1358 07/24/18 0459 07/25/18 0708 07/26/18 0631 07/27/18 0630  WBC 9.2 3.3* 6.9 5.3  --   CREATININE 1.01* 1.05* 0.88  0.86 0.71 0.61    Estimated Creatinine Clearance: 51.8 mL/min (by C-G formula based on SCr of 0.61 mg/dL).    Allergies  Allergen Reactions  . Pregabalin     REACTION: "couldn't move"    Antimicrobials this admission: Cefepime 2/26>>  Azithromycin 2/25 >>  Ceftriaxone 2/25 >>2/26   Microbiology results: No cultures  Thank you for allowing pharmacy to be a part of this patient's care.  Despina Pole, Pharm. D. Clinical Pharmacist 07/27/2018 2:26 PM

## 2018-07-27 NOTE — Progress Notes (Signed)
I held AM meds because patient was sleeping.

## 2018-07-28 ENCOUNTER — Encounter (HOSPITAL_COMMUNITY): Payer: Self-pay | Admitting: Family Medicine

## 2018-07-28 ENCOUNTER — Inpatient Hospital Stay (HOSPITAL_COMMUNITY): Payer: Medicare Other

## 2018-07-28 DIAGNOSIS — Z720 Tobacco use: Secondary | ICD-10-CM

## 2018-07-28 LAB — COMPREHENSIVE METABOLIC PANEL
ALBUMIN: 2.8 g/dL — AB (ref 3.5–5.0)
ALT: 14 U/L (ref 0–44)
AST: 42 U/L — ABNORMAL HIGH (ref 15–41)
Alkaline Phosphatase: 120 U/L (ref 38–126)
Anion gap: 7 (ref 5–15)
BUN: 6 mg/dL — ABNORMAL LOW (ref 8–23)
CO2: 22 mmol/L (ref 22–32)
Calcium: 12 mg/dL — ABNORMAL HIGH (ref 8.9–10.3)
Chloride: 110 mmol/L (ref 98–111)
Creatinine, Ser: 0.71 mg/dL (ref 0.44–1.00)
GFR calc Af Amer: >60 mL/min (ref >60)
GFR calc non Af Amer: >60 mL/min (ref >60)
GLUCOSE: 94 mg/dL (ref 70–99)
Potassium: 3.8 mmol/L (ref 3.5–5.1)
Sodium: 139 mmol/L (ref 135–145)
Total Bilirubin: 0.4 mg/dL (ref 0.3–1.2)
Total Protein: 6.9 g/dL (ref 6.5–8.1)

## 2018-07-28 LAB — SODIUM, URINE, RANDOM: SODIUM UR: 119 mmol/L

## 2018-07-28 LAB — PHOSPHORUS: Phosphorus: 2.6 mg/dL (ref 2.5–4.6)

## 2018-07-28 LAB — MAGNESIUM: Magnesium: 1.6 mg/dL — ABNORMAL LOW (ref 1.7–2.4)

## 2018-07-28 LAB — OSMOLALITY: Osmolality: 287 mOsm/kg (ref 275–295)

## 2018-07-28 MED ORDER — OXYCODONE HCL 5 MG PO TABS
2.5000 mg | ORAL_TABLET | Freq: Four times a day (QID) | ORAL | Status: DC
  Administered 2018-07-28 – 2018-07-29 (×2): 2.5 mg via ORAL
  Filled 2018-07-28 (×2): qty 1

## 2018-07-28 MED ORDER — MAGNESIUM SULFATE 2 GM/50ML IV SOLN
2.0000 g | Freq: Once | INTRAVENOUS | Status: AC
  Administered 2018-07-28: 2 g via INTRAVENOUS
  Filled 2018-07-28: qty 50

## 2018-07-28 MED ORDER — ACETAMINOPHEN 650 MG RE SUPP: 650.0000 mg | Freq: Four times a day (QID) | RECTAL | Status: DC

## 2018-07-28 MED ORDER — CALCITONIN (SALMON) 200 UNIT/ML IJ SOLN
4.0000 [IU]/kg | Freq: Two times a day (BID) | INTRAMUSCULAR | Status: DC
  Administered 2018-07-28 – 2018-07-29 (×3): 214 [IU] via INTRAMUSCULAR
  Filled 2018-07-28: qty 1.07
  Filled 2018-07-28: qty 2
  Filled 2018-07-28 (×3): qty 1.07

## 2018-07-28 MED ORDER — FENTANYL CITRATE (PF) 100 MCG/2ML IJ SOLN
12.5000 ug | INTRAMUSCULAR | Status: DC | PRN
  Administered 2018-07-28 – 2018-07-29 (×3): 12.5 ug via INTRAVENOUS
  Filled 2018-07-28 (×3): qty 2

## 2018-07-28 MED ORDER — ZOLEDRONIC ACID 4 MG/5ML IV CONC
4.0000 mg | Freq: Once | INTRAVENOUS | Status: AC
  Administered 2018-07-28: 4 mg via INTRAVENOUS
  Filled 2018-07-28: qty 5

## 2018-07-28 MED ORDER — ACETAMINOPHEN 325 MG PO TABS
650.0000 mg | ORAL_TABLET | Freq: Four times a day (QID) | ORAL | Status: DC
  Administered 2018-07-28 – 2018-07-31 (×10): 650 mg via ORAL
  Filled 2018-07-28 (×9): qty 2

## 2018-07-28 MED ORDER — IPRATROPIUM-ALBUTEROL 0.5-2.5 (3) MG/3ML IN SOLN
3.0000 mL | Freq: Four times a day (QID) | RESPIRATORY_TRACT | Status: DC
  Administered 2018-07-28 – 2018-07-31 (×8): 3 mL via RESPIRATORY_TRACT
  Filled 2018-07-28 (×9): qty 3

## 2018-07-28 MED ORDER — SODIUM CHLORIDE 0.9 % IV SOLN
INTRAVENOUS | Status: DC
  Administered 2018-07-28: 15:00:00 via INTRAVENOUS

## 2018-07-28 NOTE — Progress Notes (Addendum)
PROGRESS NOTE  Ashley Hall  KGM:[id number] DOB: 14-Mar-1946 DOA: 07/23/2018 PCP: Lucia Gaskins, MD    Brief Narrative:  73 year old female with a history of COPD, chronic pain syndrome from scoliosis, was recently discharged from skilled nursing facility and brought to the emergency room with shortness of breath.  She was found by her home health physical therapist with an oxygen saturation 79% on room air.  She was admitted for acute on respiratory failure with hypoxia secondary to healthcare associated pneumonia.  Chest x-ray did show bilateral infiltrates.  She is been started on intravenous antibiotics.  Assessment & Plan:   Principal Problem:   CAP (community acquired pneumonia) Active Problems:   Essential hypertension   Tobacco abuse   Hypothyroidism   GERD (gastroesophageal reflux disease)   Altered mental status   Normocytic anemia   Hyperlipidemia   COPD exacerbation ([place])  1. Acute respiratory failure with hypoxia.  Secondary to pneumonia.  Continue to wean down oxygen as tolerated. 2. Healthcare associated pneumonia.  Continue on broad-spectrum antibiotics with cefepime and azithromycin.  Strep pneumo antigen is negative.  Blood cultures were not sent on admission. 3. COPD exacerbation.  Continues to have rhonchi and wheezing.  Continue on intravenous Solu-Medrol.  Continue bronchodilators.  4. Newly found Lung Mass - CT chest with hilar mass  / left bronchus involvement suspicious lymphangitic spread, will obtain CT abdomen/pelvis with contrast, will consult pulmonology and oncology team to see in AM.  I spoke with patient about this and she is not interested in any type of surgery or chemo at this time.  Will ask for palliative consult to discuss goals of care.   5. Acute encephalopathy related to hypoxia.  This is improved with oxygen supplementation and mental status appears to be approaching baseline.  CT scan of her head did not show any acute changes. 6. Anemia.   She is noted to have significant B12 deficiency.  She has been started on replacement therapy.  Hemoglobin has been stable.   7. Hypertension.  Continue metoprolol.  Blood pressure stable. 8. Tobacco use.  Counseled on importance of tobacco cessation.  9. GERD.  Continue on PPI.  10. Hyperlipidemia.  Continue statin. 11. Hypothyroidism.  Continue Synthroid.   12. Chronic pain syndrome.  Continued on home dose of pain medications. 13. Hypokalemia.  Replace.  14. Hypercalcemia of malignancy -  Mostly likely secondary to malignancy.  IV pamidronate and calcitonin ordered.  IV hydration ordered.   15. Polyuria.  Patient had approximately 5 L of urine output yesterday.  ?diabetes insipidus.  She is not on any medications that would induce this.  Will check serum osmolarity, urine osmolarity.  Check urine sodium.  DVT prophylaxis: Lovenox Code Status: Full code Family Communication: No family present Disposition Plan: ongoing work up for metastatic carcinoma  Consultants:     Procedures:     Antimicrobials:   Cefepime 2/26 >  Azithromycin 2/25 >   Subjective: Continues to cough.  Does not feel that her breathing is back to baseline.  No chest pain.    Objective: Vitals:   07/28/18 0119 07/28/18 0607 07/28/18 0755 07/28/18 1304  BP:  140/66  (!) 154/63  Pulse:  94  93  Resp:  16  18  Temp:  98.9 F (37.2 C)  98.6 F (37 C)  TempSrc:  Oral  Oral  SpO2: (!) 89% 92% 92% 94%  Weight:      Height:        Intake/Output Summary (  Last 24 hours) at 07/28/2018 1428 Last data filed at 07/28/2018 0900 Gross per 24 hour  Intake 840 ml  Output 1000 ml  Net -160 ml   Filed Weights   07/23/18 1851 07/23/18 2059 07/24/18 1131  Weight: 50 kg 48.1 kg 53.5 kg    Examination:  General exam: Alert, awake, oriented x 3 Respiratory system: Bilateral rhonchi. Respiratory effort normal. Cardiovascular system:normal s1,s2 sounds. No murmurs, rubs, gallops. Gastrointestinal system:  Abdomen is nondistended, soft and nontender. No organomegaly or masses felt. Normal bowel sounds heard. Central nervous system: Alert and oriented. No focal neurological deficits. Extremities: No C/C/E, +pedal pulses Skin: No rashes, lesions or ulcers Psychiatry: Judgement and insight appear normal. Mood & affect appropriate.   Data Reviewed: I have personally reviewed following labs and imaging studies  CBC: Recent Labs  Lab 07/23/18 1358 07/24/18 0459 07/25/18 0708 07/26/18 0631  WBC 9.2 3.3* 6.9 5.3  NEUTROABS 7.7 2.9 5.1 3.5  HGB 10.0* 10.0* 10.6* 9.4*  HCT 34.7* 34.1* 36.2 31.8*  MCV 99.4 101.2* 101.4* 98.8  PLT 184 178 196 462   Basic Metabolic Panel: Recent Labs  Lab 07/23/18 1643 07/24/18 0459 07/25/18 0708 07/26/18 0631 07/27/18 0630 07/28/18 0736  NA  --  141 141  141 141 143 139  K  --  4.1 3.2*  3.2* 3.4* 3.0* 3.8  CL  --  111 112*  111 113* 113* 110  CO2  --  22 22  23 22 24 22   GLUCOSE  --  130* 81  81 91 94 94  BUN  --  15 16  16 8  7* 6*  CREATININE  --  1.05* 0.88  0.86 0.71 0.61 0.71  CALCIUM  --  11.7* 11.8*  11.9* 11.1* 11.3* 12.0*  MG 1.9  --   --  1.6*  --  1.6*  PHOS 2.7 2.9 1.7* 1.8*  --  2.6   GFR: Estimated Creatinine Clearance: 51.8 mL/min (by C-G formula based on SCr of 0.71 mg/dL). Liver Function Tests: Recent Labs  Lab 07/23/18 1643 07/24/18 0459 07/25/18 0708 07/26/18 0631 07/27/18 0630 07/28/18 0736  AST 42*  --   --   --  38 42*  ALT 11  --   --   --  13 14  ALKPHOS 116  --   --   --  87 120  BILITOT 0.2*  --   --   --  0.3 0.4  PROT 6.8  --   --   --  5.6* 6.9  ALBUMIN 3.0* 2.4* 2.7* 2.3* 2.2* 2.8*   No results for input(s): LIPASE, AMYLASE in the last 168 hours. No results for input(s): AMMONIA in the last 168 hours. Coagulation Profile: No results for input(s): INR, PROTIME in the last 168 hours. Cardiac Enzymes: No results for input(s): CKTOTAL, CKMB, CKMBINDEX, TROPONINI in the last 168 hours. BNP (last 3  results) No results for input(s): PROBNP in the last 8760 hours. HbA1C: No results for input(s): HGBA1C in the last 72 hours. CBG: No results for input(s): GLUCAP in the last 168 hours. Lipid Profile: No results for input(s): CHOL, HDL, LDLCALC, TRIG, CHOLHDL, LDLDIRECT in the last 72 hours. Thyroid Function Tests: No results for input(s): TSH, T4TOTAL, FREET4, T3FREE, THYROIDAB in the last 72 hours. Anemia Panel: No results for input(s): VITAMINB12, FOLATE, FERRITIN, TIBC, IRON, RETICCTPCT in the last 72 hours. Sepsis Labs: No results for input(s): PROCALCITON, LATICACIDVEN in the last 168 hours.  No results found for this  or any previous visit (from the past 240 hour(s)).   Radiology Studies: Ct Chest W Contrast  Result Date: 07/27/2018 CLINICAL DATA:  Pneumonia.  Hx of HTN,COPD/bbj EXAM: CT CHEST WITH CONTRAST TECHNIQUE: Multidetector CT imaging of the chest was performed during intravenous contrast administration. CONTRAST:  53mL OMNIPAQUE IOHEXOL 300 MG/ML  SOLN COMPARISON:  07/23/2018 chest x-ray and 03/07/2015 chest CT FINDINGS: Cardiovascular: There is dense atherosclerotic calcification of the coronary arteries. No pericardial effusion. Heart size is normal. There is minimal calcification of the thoracic aorta not associated with aneurysm. The appearance of the pulmonary arteries is unremarkable accounting for the contrast bolus timing. Mediastinum/Nodes: Thyroid is unremarkable. Numerous enlarged mediastinal lymph nodes. Pretracheal lymph node is 1.5 centimeters. Precarinal lymph node is 1.5 centimeters. Confluent subcarinal mass/adenopathy is 1.9 x 6.5 centimeters, including the esophagus which is encased. Large prevascular lymph nodes, largest measuring 3.1 centimeters. RIGHT infrahilar mass 2.2 centimeters. RIGHT hilar lymph nodes are 1.3 and 1.9 centimeters. Confluent opacity in the LEFT hilum precludes measurement of discrete nodes. Lungs/Pleura: Bilateral pleural effusions, LEFT  greater than RIGHT. There is extrinsic effacement of the LEFT mainstem bronchus secondary to mediastinal adenopathy. There is dense opacity within the LEFT hilar region measuring 5.1 x 8.3 centimeters on coronal images. There is consolidation in the LOWER lobes bilaterally, LEFT greater than RIGHT. Focal discrete areas of airspace filling identified in the RIGHT UPPER and LOWER lobes. Extensive irregular thickening of the septal lines seen throughout the LEFT lung, more confluent opacity near the hilar regions and bases. Discrete nodule in the LEFT LOWER lobe is 6 millimeters on image 77 of series 4. Upper Abdomen: Partially imaged cyst within the UPPER pole of the LEFT kidney is 1.5 centimeters in diameter. The imaged portions of the adrenal glands are unremarkable. Question of colonic wall thickening in the region of the splenic flexure, incompletely evaluated on this study of the chest. There is atherosclerotic calcification of the abdominal aorta. Musculoskeletal: Irregularity and sclerosis involving the manubrium of the sternum. Findings are consistent with remote fracture, new since the prior CT exam in 2016. There are numerous old rib fractures. No definitive lytic or blastic lesions. IMPRESSION: 1. Confluent masslike density in the LEFT hilar region, associated with extrinsic narrowing of the LEFT mainstem bronchus measuring up to 8.3 centimeters. There is associated significant mediastinal and hilar adenopathy. Significant septal wall thickening throughout the LEFT lung and associated pleural effusion is suspicious for lymphangitic spread of tumor. 2. Focal areas of airspace filling in the RIGHT lung may be infection or tumor. 3. Remote sternal fracture. 4. Numerous old rib fractures. 5. Question of colonic wall thickening in the region of the splenic flexure. This is incompletely evaluated but raises a question of colonic lesion or colitis. Consider further evaluation with CT of the abdomen and pelvis.  Intravenous and Oral contrast are recommended unless contraindicated. 6. Coronary artery disease. 7. Partially imaged LEFT renal cyst. 8.  Aortic atherosclerosis.  (ICD10-I70.0) coronary artery disease. These results will be called to the ordering clinician or representative by the Radiologist Assistant, and communication documented in the PACS or zVision Dashboard. Electronically Signed   By: Nolon Nations M.D.   On: 07/27/2018 20:00   Scheduled Meds: . amLODipine  5 mg Oral Daily  . busPIRone  5 mg Oral BID  . enoxaparin (LOVENOX) injection  40 mg Subcutaneous Q24H  . feeding supplement (ENSURE ENLIVE)  237 mL Oral BID BM  . ferrous gluconate  324 mg Oral Q breakfast  .  folic acid  1 mg Oral Daily  . gabapentin  200 mg Oral TID  . ipratropium-albuterol  3 mL Nebulization Q6H WA  . levothyroxine  50 mcg Oral q morning - 10a  . mouth rinse  15 mL Mouth Rinse BID  . metoprolol tartrate  25 mg Oral BID  . multivitamin with minerals  1 tablet Oral Daily  . oxybutynin  5 mg Oral Daily  . pantoprazole  40 mg Oral BID  . PARoxetine  20 mg Oral q morning - 10a  . phosphorus  500 mg Oral TID WC & HS  . pravastatin  40 mg Oral q morning - 10a  . topiramate  100 mg Oral Daily  . traZODone  50 mg Oral QHS   Continuous Infusions: . sodium chloride 500 mL (07/23/18 1823)  . sodium chloride    . azithromycin 500 mg (07/27/18 2039)  . ceFEPime (MAXIPIME) IV 2 g (07/27/18 1557)     LOS: 5 days   Time spent: 30 mins   Wynetta Emery, MD [facility] How to contact the [facility] Attending or Consulting provider [place] or covering provider during after hours [place], for this patient?  1. Check the care team in [facility] and look for a) attending/consulting [facility] provider listed and b) the [facility] team listed 2. Log into [email or web address] and use [place]'s universal password to access. If you do not have the password, please contact the hospital operator. 3. Locate the [facility] provider you are looking for  under [facility] and page to a number that you can be directly reached. 4. If you still have difficulty reaching the provider, please page the [facility] (Director on Call) for the Hospitalists listed on amion for assistance.   If 7PM-7AM, please contact night-coverage [email or web address]  07/28/2018, 2:28 PM

## 2018-07-28 NOTE — Progress Notes (Signed)
Patient is sleeping so I held her AM meds. Will give when she wakes up.

## 2018-07-29 ENCOUNTER — Encounter (HOSPITAL_COMMUNITY): Payer: Self-pay | Admitting: Primary Care

## 2018-07-29 DIAGNOSIS — Z515 Encounter for palliative care: Secondary | ICD-10-CM

## 2018-07-29 DIAGNOSIS — R0902 Hypoxemia: Secondary | ICD-10-CM

## 2018-07-29 DIAGNOSIS — Z7189 Other specified counseling: Secondary | ICD-10-CM

## 2018-07-29 DIAGNOSIS — R410 Disorientation, unspecified: Secondary | ICD-10-CM

## 2018-07-29 LAB — BLOOD GAS, ARTERIAL
ACID-BASE EXCESS: 0 mmol/L (ref 0.0–2.0)
Bicarbonate: 24.8 mmol/L (ref 20.0–28.0)
FIO2: 40
O2 Saturation: 93.9 %
Patient temperature: 37
pCO2 arterial: 33.8 mmHg (ref 32.0–48.0)
pH, Arterial: 7.456 — ABNORMAL HIGH (ref 7.350–7.450)
pO2, Arterial: 70.7 mmHg — ABNORMAL LOW (ref 83.0–108.0)

## 2018-07-29 LAB — KAPPA/LAMBDA LIGHT CHAINS
Kappa free light chain: 56.9 mg/L — ABNORMAL HIGH (ref 3.3–19.4)
Kappa, lambda light chain ratio: 0.95 (ref 0.26–1.65)
Lambda free light chains: 59.9 mg/L — ABNORMAL HIGH (ref 5.7–26.3)

## 2018-07-29 LAB — CBC
HCT: 29.9 % — ABNORMAL LOW (ref 36.0–46.0)
Hemoglobin: 8.9 g/dL — ABNORMAL LOW (ref 12.0–15.0)
MCH: 28.6 pg (ref 26.0–34.0)
MCHC: 29.8 g/dL — ABNORMAL LOW (ref 30.0–36.0)
MCV: 96.1 fL (ref 80.0–100.0)
Platelets: 186 10*3/uL (ref 150–400)
RBC: 3.11 MIL/uL — ABNORMAL LOW (ref 3.87–5.11)
RDW: 13.3 % (ref 11.5–15.5)
WBC: 8.1 10*3/uL (ref 4.0–10.5)
nRBC: 0 % (ref 0.0–0.2)

## 2018-07-29 LAB — COMPREHENSIVE METABOLIC PANEL
ALT: 13 U/L (ref 0–44)
ANION GAP: 7 (ref 5–15)
AST: 36 U/L (ref 15–41)
Albumin: 2.3 g/dL — ABNORMAL LOW (ref 3.5–5.0)
Alkaline Phosphatase: 96 U/L (ref 38–126)
BUN: 6 mg/dL — ABNORMAL LOW (ref 8–23)
CO2: 20 mmol/L — ABNORMAL LOW (ref 22–32)
Calcium: 10.8 mg/dL — ABNORMAL HIGH (ref 8.9–10.3)
Chloride: 111 mmol/L (ref 98–111)
Creatinine, Ser: 0.61 mg/dL (ref 0.44–1.00)
GFR calc Af Amer: >60 mL/min (ref >60)
GFR calc non Af Amer: >60 mL/min (ref >60)
Glucose, Bld: 128 mg/dL — ABNORMAL HIGH (ref 70–99)
Potassium: 3.4 mmol/L — ABNORMAL LOW (ref 3.5–5.1)
Sodium: 138 mmol/L (ref 135–145)
Total Bilirubin: 0.3 mg/dL (ref 0.3–1.2)
Total Protein: 5.7 g/dL — ABNORMAL LOW (ref 6.5–8.1)

## 2018-07-29 LAB — PROTEIN ELECTROPHORESIS, SERUM
A/G Ratio: 0.8 (ref 0.7–1.7)
Albumin ELP: 2.6 g/dL — ABNORMAL LOW (ref 2.9–4.4)
Alpha-1-Globulin: 0.4 g/dL (ref 0.0–0.4)
Alpha-2-Globulin: 1 g/dL (ref 0.4–1.0)
Beta Globulin: 1 g/dL (ref 0.7–1.3)
Gamma Globulin: 0.8 g/dL (ref 0.4–1.8)
Globulin, Total: 3.2 g/dL (ref 2.2–3.9)
Total Protein ELP: 5.8 g/dL — ABNORMAL LOW (ref 6.0–8.5)

## 2018-07-29 LAB — CALCITRIOL (1,25 DI-OH VIT D): Vit D, 1,25-Dihydroxy: 90.2 pg/mL — ABNORMAL HIGH (ref 19.9–79.3)

## 2018-07-29 LAB — OSMOLALITY, URINE: Osmolality, Ur: 309 mOsm/kg (ref 300–900)

## 2018-07-29 MED ORDER — DOXYCYCLINE HYCLATE 100 MG PO TABS
100.0000 mg | ORAL_TABLET | Freq: Two times a day (BID) | ORAL | Status: DC
  Administered 2018-07-30: 100 mg via ORAL
  Filled 2018-07-29: qty 1

## 2018-07-29 MED ORDER — SODIUM CHLORIDE 0.9 % IV SOLN
2.0000 g | Freq: Two times a day (BID) | INTRAVENOUS | Status: AC
  Administered 2018-07-29 (×2): 2 g via INTRAVENOUS
  Filled 2018-07-29 (×2): qty 2

## 2018-07-29 MED ORDER — OXYCODONE HCL 5 MG PO TABS
2.5000 mg | ORAL_TABLET | Freq: Four times a day (QID) | ORAL | Status: DC | PRN
  Administered 2018-07-29 – 2018-07-31 (×8): 2.5 mg via ORAL
  Filled 2018-07-29 (×8): qty 1

## 2018-07-29 MED ORDER — METHYLPREDNISOLONE SODIUM SUCC 40 MG IJ SOLR
40.0000 mg | Freq: Two times a day (BID) | INTRAMUSCULAR | Status: DC
  Administered 2018-07-29 – 2018-07-30 (×2): 40 mg via INTRAVENOUS
  Filled 2018-07-29 (×2): qty 1

## 2018-07-29 MED ORDER — POTASSIUM CHLORIDE IN NACL 20-0.9 MEQ/L-% IV SOLN
INTRAVENOUS | Status: DC
  Administered 2018-07-29 – 2018-07-30 (×2): via INTRAVENOUS

## 2018-07-29 NOTE — Progress Notes (Signed)
PROGRESS NOTE  Ashley Hall  BMW:[id number] DOB: 09-Mar-1946 DOA: 07/23/2018 PCP: Lucia Gaskins, MD    Brief Narrative:  73 year old female with a history of COPD, chronic pain syndrome from scoliosis, was recently discharged from skilled nursing facility and brought to the emergency room with shortness of breath.  She was found by her home health physical therapist with an oxygen saturation 79% on room air.  She was admitted for acute on respiratory failure with hypoxia secondary to healthcare associated pneumonia.  Chest x-ray did show bilateral infiltrates.  She is been started on intravenous antibiotics.  Assessment & Plan:   Principal Problem:   CAP (community acquired pneumonia) Active Problems:   Essential hypertension   Tobacco abuse   Hypothyroidism   GERD (gastroesophageal reflux disease)   Altered mental status   Normocytic anemia   Hyperlipidemia   COPD exacerbation (HCC)   Goals of care, counseling/discussion   Palliative care by specialist   DNR (do not resuscitate) discussion  1. Acute respiratory failure with hypoxia.  Secondary to pneumonia and presumed lung cancer.  Continue to wean down oxygen as tolerated. 2. Healthcare associated pneumonia.  Initially started on broad-spectrum antibiotics with cefepime and azithromycin.  De-escalate to doxycycline 3/3.  Strep pneumo antigen is negative.  Blood cultures were not sent on admission. 3. COPD exacerbation.  Continues to have rhonchi and wheezing.  Continue on intravenous Solu-Medrol.  Continue bronchodilators.  4. Newly found Lung Mass - CT chest with hilar mass  / left bronchus involvement suspicious lymphangitic spread, Pt declined the CT abdomen/pelvis with contrast, Consulted pulmonology however patient not amenable to having procedures.  I spoke with patient about this and she is not interested in any type of surgery or chemotherapy at this time and she did not seem interested in pursuing further tissue  diagnosis.  She declined further CT scanning.  Appreciate palliative consult to discuss goals of care.   5. Acute encephalopathy related to hypoxia.  She has been waxing and waning with her mentation. This has somewhat improved with oxygen supplementation.  CT scan of her head did not show any acute changes. 6. 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  She is noted to have significant B12 deficiency.  She has been started on replacement therapy.  Hemoglobin has been stable.   7. Hypertension.  Continue metoprolol.  Blood pressure stable. 8. Tobacco use.  Counseled on importance of tobacco cessation.  9. GERD.  Continue on PPI.  10. Hyperlipidemia.  Continue statin. 11. Hypothyroidism.  Continue Synthroid.   12. Chronic pain syndrome.  Continued on home dose of pain medications. 13. Hypokalemia.  Replace.  14. Hypercalcemia of malignancy -  Mostly likely secondary to undiagnosed malignancy.  IV pamidronate and calcitonin given and calcium levels coming down.  IV hydration ordered.   15. Polyuria.  Patient had approximately 5 L of urine output yesterday. 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   DVT prophylaxis: Lovenox Code Status: Full code Family Communication: No family present, family meeting with palliative care 3/3 Disposition Plan: likely SNF with hospice versus residential hospice  Consultants:     Procedures:     Antimicrobials:   Cefepime 2/26 >  Azithromycin 2/25 >   Subjective: Pt lethargic but arousable, says pain much better controlled today.     Objective: Vitals:   07/29/18 0531 07/29/18 0613 07/29/18 1320 07/29/18 1452  BP: (!) 153/62  (!) 148/57   Pulse: 83  72   Resp:   19   Temp:  98 F (36.7 C)  97.9 F (36.6 C)   TempSrc: Oral  Oral   SpO2: 96% 95% 93% 95%  Weight:      Height:        Intake/Output Summary (Last 24 hours) at 07/29/2018 1508 Last data filed at 07/29/2018 1111 Gross per 24 hour  Intake 2769.84 ml  Output 1450 ml  Net 1319.84 ml    Filed Weights   07/23/18 1851 07/23/18 2059 07/24/18 1131  Weight: 50 kg 48.1 kg 53.5 kg    Examination:  General exam: emaciated, chronically ill appearing female, lethargic but arousable.  NAD.  Respiratory system: Bilateral rhonchi unchanged. Respiratory effort normal. Cardiovascular system:normal s1,s2 sounds. No murmurs, rubs, gallops. Gastrointestinal system: Abdomen is nondistended, soft and nontender. No organomegaly or masses felt. Normal bowel sounds heard. Central nervous system: Alert and oriented. No focal neurological deficits. Extremities: No C/C/E, +pedal pulses Skin: No rashes, lesions or ulcers Psychiatry: Judgement and insight appear normal. Mood & affect appropriate.   Data Reviewed: I have personally reviewed following labs and imaging studies  CBC: Recent Labs  Lab 07/23/18 1358 07/24/18 0459 07/25/18 0708 07/26/18 0631 07/29/18 0628  WBC 9.2 3.3* 6.9 5.3 8.1  NEUTROABS 7.7 2.9 5.1 3.5  --   HGB 10.0* 10.0* 10.6* 9.4* 8.9*  HCT 34.7* 34.1* 36.2 31.8* 29.9*  MCV 99.4 101.2* 101.4* 98.8 96.1  PLT 184 178 196 163 798   Basic Metabolic Panel: Recent Labs  Lab 07/23/18 1643 07/24/18 0459 07/25/18 0708 07/26/18 0631 07/27/18 0630 07/28/18 0736 07/29/18 0628  NA  --  141 141  141 141 143 139 138  K  --  4.1 3.2*  3.2* 3.4* 3.0* 3.8 3.4*  CL  --  111 112*  111 113* 113* 110 111  CO2  --  22 22  23 22 24 22  20*  GLUCOSE  --  130* 81  81 91 94 94 128*  BUN  --  15 16  16 8  7* 6* 6*  CREATININE  --  1.05* 0.88  0.86 0.71 0.61 0.71 0.61  CALCIUM  --  11.7* 11.8*  11.9* 11.1* 11.3* 12.0* 10.8*  MG 1.9  --   --  1.6*  --  1.6*  --   PHOS 2.7 2.9 1.7* 1.8*  --  2.6  --    GFR: Estimated Creatinine Clearance: 51.8 mL/min (by C-G formula based on SCr of 0.61 mg/dL). Liver Function Tests: Recent Labs  Lab 07/23/18 1643  07/25/18 0708 07/26/18 0631 07/27/18 0630 07/28/18 0736 07/29/18 0628  AST 42*  --   --   --  38 42* 36  ALT 11  --    --   --  13 14 13   ALKPHOS 116  --   --   --  87 120 96  BILITOT 0.2*  --   --   --  0.3 0.4 0.3  PROT 6.8  --   --   --  5.6* 6.9 5.7*  ALBUMIN 3.0*   < > 2.7* 2.3* 2.2* 2.8* 2.3*   < > = values in this interval not displayed.   No results for input(s): LIPASE, AMYLASE in the last 168 hours. No results for input(s): AMMONIA in the last 168 hours. Coagulation Profile: No results for input(s): INR, PROTIME in the last 168 hours. Cardiac Enzymes: No results for input(s): CKTOTAL, CKMB, CKMBINDEX, TROPONINI in the last 168 hours. BNP (last 3 results) No results for input(s): PROBNP in the last 8760  hours. HbA1C: No results for input(s): HGBA1C in the last 72 hours. CBG: No results for input(s): GLUCAP in the last 168 hours. Lipid Profile: No results for input(s): CHOL, HDL, LDLCALC, TRIG, CHOLHDL, LDLDIRECT in the last 72 hours. Thyroid Function Tests: No results for input(s): TSH, T4TOTAL, FREET4, T3FREE, THYROIDAB in the last 72 hours. Anemia Panel: No results for input(s): VITAMINB12, FOLATE, FERRITIN, TIBC, IRON, RETICCTPCT in the last 72 hours. Sepsis Labs: No results for input(s): PROCALCITON, LATICACIDVEN in the last 168 hours.  No results found for this or any previous visit (from the past 240 hour(s)).   Radiology Studies: Ct Chest W Contrast  Result Date: 07/27/2018 CLINICAL DATA:  Pneumonia.  Hx of HTN,COPD/bbj EXAM: CT CHEST WITH CONTRAST TECHNIQUE: Multidetector CT imaging of the chest was performed during intravenous contrast administration. CONTRAST:  68mL OMNIPAQUE IOHEXOL 300 MG/ML  SOLN COMPARISON:  07/23/2018 chest x-ray and 03/07/2015 chest CT FINDINGS: Cardiovascular: There is dense atherosclerotic calcification of the coronary arteries. No pericardial effusion. Heart size is normal. There is minimal calcification of the thoracic aorta not associated with aneurysm. The appearance of the pulmonary arteries is unremarkable accounting for the contrast bolus timing.  Mediastinum/Nodes: Thyroid is unremarkable. Numerous enlarged mediastinal lymph nodes. Pretracheal lymph node is 1.5 centimeters. Precarinal lymph node is 1.5 centimeters. Confluent subcarinal mass/adenopathy is 1.9 x 6.5 centimeters, including the esophagus which is encased. Large prevascular lymph nodes, largest measuring 3.1 centimeters. RIGHT infrahilar mass 2.2 centimeters. RIGHT hilar lymph nodes are 1.3 and 1.9 centimeters. Confluent opacity in the LEFT hilum precludes measurement of discrete nodes. Lungs/Pleura: Bilateral pleural effusions, LEFT greater than RIGHT. There is extrinsic effacement of the LEFT mainstem bronchus secondary to mediastinal adenopathy. There is dense opacity within the LEFT hilar region measuring 5.1 x 8.3 centimeters on coronal images. There is consolidation in the LOWER lobes bilaterally, LEFT greater than RIGHT. Focal discrete areas of airspace filling identified in the RIGHT UPPER and LOWER lobes. Extensive irregular thickening of the septal lines seen throughout the LEFT lung, more confluent opacity near the hilar regions and bases. Discrete nodule in the LEFT LOWER lobe is 6 millimeters on image 77 of series 4. Upper Abdomen: Partially imaged cyst within the UPPER pole of the LEFT kidney is 1.5 centimeters in diameter. The imaged portions of the adrenal glands are unremarkable. Question of colonic wall thickening in the region of the splenic flexure, incompletely evaluated on this study of the chest. There is atherosclerotic calcification of the abdominal aorta. Musculoskeletal: Irregularity and sclerosis involving the manubrium of the sternum. Findings are consistent with remote fracture, new since the prior CT exam in 2016. There are numerous old rib fractures. No definitive lytic or blastic lesions. IMPRESSION: 1. Confluent masslike density in the LEFT hilar region, associated with extrinsic narrowing of the LEFT mainstem bronchus measuring up to 8.3 centimeters. There is  associated significant mediastinal and hilar adenopathy. Significant septal wall thickening throughout the LEFT lung and associated pleural effusion is suspicious for lymphangitic spread of tumor. 2. Focal areas of airspace filling in the RIGHT lung may be infection or tumor. 3. Remote sternal fracture. 4. Numerous old rib fractures. 5. Question of colonic wall thickening in the region of the splenic flexure. This is incompletely evaluated but raises a question of colonic lesion or colitis. Consider further evaluation with CT of the abdomen and pelvis. Intravenous and Oral contrast are recommended unless contraindicated. 6. Coronary artery disease. 7. Partially imaged LEFT renal cyst. 8.  Aortic atherosclerosis.  (ICD10-I70.0) coronary  artery disease. These results will be called to the ordering clinician or representative by the Radiologist Assistant, and communication documented in the PACS or zVision Dashboard. Electronically Signed   By: Nolon Nations M.D.   On: 07/27/2018 20:00   Scheduled Meds: . acetaminophen  650 mg Oral Q6H   Or  . acetaminophen  650 mg Rectal Q6H  . amLODipine  5 mg Oral Daily  . busPIRone  5 mg Oral BID  . calcitonin  4 Units/kg Intramuscular BID  . enoxaparin (LOVENOX) injection  40 mg Subcutaneous Q24H  . feeding supplement (ENSURE ENLIVE)  237 mL Oral BID BM  . ferrous gluconate  324 mg Oral Q breakfast  . folic acid  1 mg Oral Daily  . gabapentin  200 mg Oral TID  . ipratropium-albuterol  3 mL Nebulization Q6H WA  . levothyroxine  50 mcg Oral q morning - 10a  . mouth rinse  15 mL Mouth Rinse BID  . metoprolol tartrate  25 mg Oral BID  . multivitamin with minerals  1 tablet Oral Daily  . oxybutynin  5 mg Oral Daily  . pantoprazole  40 mg Oral BID  . PARoxetine  20 mg Oral q morning - 10a  . phosphorus  500 mg Oral TID WC & HS  . pravastatin  40 mg Oral q morning - 10a  . topiramate  100 mg Oral Daily  . traZODone  50 mg Oral QHS   Continuous Infusions: .  sodium chloride 500 mL (07/23/18 1823)  . 0.9 % NaCl with KCl 20 mEq / L 70 mL/hr at 07/29/18 1053  . azithromycin 500 mg (07/28/18 2214)  . ceFEPime (MAXIPIME) IV 2 g (07/29/18 1214)     LOS: 6 days   Time spent: 24 mins  Traquan Duarte Wynetta Emery, MD [facility] How to contact the [facility] Attending or Consulting provider [place] or covering provider during after hours [place], for this patient?  1. Check the care team in [facility] and look for a) attending/consulting [facility] provider listed and b) the [facility] team listed 2. Log into [email or web address] and use [facility]'s universal password to access. If you do not have the password, please contact the hospital operator. 3. Locate the [facility] provider you are looking for under [facility] and page to a number that you can be directly reached. 4. If you still have difficulty reaching the provider, please page the [facility] (Director on Call) for the Hospitalists listed on amion for assistance.   If 7PM-7AM, please contact night-coverage [email or web address]  07/29/2018, 3:08 PM

## 2018-07-29 NOTE — Consult Note (Signed)
Consult requested by: [facility] hospitalist, Dr. Wynetta Emery Consult requested for: Healthcare associated pneumonia/lung mass  HPI: This is a 73 year old who is known to have COPD and chronic pain.  She has history of depression seizure disorder bipolar 1 disorder chronic back pain.  She had recently been in a skilled care facility and was brought to the emergency department because of shortness of breath.  She was found to have oxygen saturation of 79% on room air and was noted to have pneumonia and a masslike lesion.  She is sleepy this morning and does not provide much history.  Past Medical History:  Diagnosis Date  . Acid reflux   . Alcohol abuse, in remission   . Arthritis   . B12 DEFICIENCY 06/15/2009   Qualifier: Diagnosis of  By: Delfino Lovett    . Bipolar 1 disorder ([place])   . Chronic back pain    Dr Merlene Laughter  . Chronic constipation   . COPD (chronic obstructive pulmonary disease) ([place])   . Depression   . ETOH abuse   . Hyperlipidemia 07/24/2018  . Hypertension   . Neurotic depression   . Personality disorder ([place])   . S/P colonoscopy 12/28/05   Dr Catalina Antigua prep, lipoma left colon  . 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 Seizures ([place])   . Sinus drainage   . Tobacco abuse 05/06/2014     Family History  Problem Relation Age of Onset  . Heart attack Mother        Deceased  . Diabetes Mother        Deceased  . Cancer Father        Deceased  . Schizophrenia Brother      Social History   Socioeconomic History  . Marital status: Divorced    Spouse name: Not on file  . Number of children: 1  . Years of education: Not on file  . Highest education level: Not on file  Occupational History  . Occupation: disabled  Social Needs  . Financial resource strain: Not on file  . Food insecurity:    Worry: Not on file    Inability: Not on file  . Transportation needs:    Medical: Not on file    Non-medical: Not on file  Tobacco Use  . Smoking status:  Heavy Tobacco Smoker    Packs/day: 0.50    Years: 48.00    Pack years: 24.00    Types: Cigarettes  . Smokeless tobacco: Never Used  . Tobacco comment: smoke the fake cigarettes now  Substance and Sexual Activity  . Alcohol use: Yes    Comment: heavy etoh x53yrs, quit 2004  . Drug use: No    Frequency: 5.0 times per week  . Sexual activity: Not on file  Lifestyle  . Physical activity:    Days per week: Not on file    Minutes per session: Not on file  . Stress: Not on file  Relationships  . Social connections:    Talks on phone: Not on file    Gets together: Not on file    Attends religious service: Not on file    Active member of club or organization: Not on file    Attends meetings of clubs or organizations: Not on file    Relationship status: Not on file  Other Topics Concern  . Not on file  Social History Narrative   Lives alone     ROS: Unobtainable    Objective: Vital signs in last 24  hours: Temp:  [98 F (36.7 C)-98.6 F (37 C)] 98 F (36.7 C) (03/02 0531) Pulse Rate:  [81-93] 83 (03/02 0531) Resp:  [18-20] 20 (03/01 2140) BP: (148-154)/(57-63) 153/62 (03/02 0531) SpO2:  [90 %-96 %] 95 % (03/02 6269) Weight change:  Last BM Date: 07/27/18  Intake/Output from previous day: 03/01 0701 - 03/02 0700 In: 4089.8 [P.O.:1800; I.V.:2187.8; IV Piggyback:102.1] Out: 1350 [Urine:1350]  PHYSICAL EXAM Constitutional: She is sleepy but arousable but goes back to sleep.  Eyes: Pupils react.  Ears nose mouth and throat: Mucous membranes are moist.  Hearing grossly normal.  Cardiovascular: Her heart is regular with normal heart sounds.  Respiratory: Respiratory effort is somewhat increased and she has bilateral rhonchi.  Gastrointestinal: Her abdomen is nontender with normal active bowel sounds.  Musculoskeletal: Cannot assess.  Neurological: Sleepy so cannot make further assessment.  Psychiatric: Cannot assess  Lab Results: Basic Metabolic Panel: Recent Labs     07/28/18 0736 07/29/18 0628  NA 139 138  K 3.8 3.4*  CL 110 111  CO2 22 20*  GLUCOSE 94 128*  BUN 6* 6*  CREATININE 0.71 0.61  CALCIUM 12.0* 10.8*  MG 1.6*  --   PHOS 2.6  --    Liver Function Tests: Recent Labs    07/28/18 0736 07/29/18 0628  AST 42* 36  ALT 14 13  ALKPHOS 120 96  BILITOT 0.4 0.3  PROT 6.9 5.7*  ALBUMIN 2.8* 2.3*   No results for input(s): LIPASE, AMYLASE in the last 72 hours. No results for input(s): AMMONIA in the last 72 hours. CBC: Recent Labs    07/29/18 0628  WBC 8.1  HGB 8.9*  HCT 29.9*  MCV 96.1  PLT 186   Cardiac Enzymes: No results for input(s): CKTOTAL, CKMB, CKMBINDEX, TROPONINI in the last 72 hours. BNP: No results for input(s): PROBNP in the last 72 hours. D-Dimer: No results for input(s): DDIMER in the last 72 hours. CBG: No results for input(s): GLUCAP in the last 72 hours. Hemoglobin A1C: No results for input(s): HGBA1C in the last 72 hours. Fasting Lipid Panel: No results for input(s): CHOL, HDL, LDLCALC, TRIG, CHOLHDL, LDLDIRECT in the last 72 hours. Thyroid Function Tests: No results for input(s): TSH, T4TOTAL, FREET4, T3FREE, THYROIDAB in the last 72 hours. Anemia Panel: No results for input(s): VITAMINB12, FOLATE, FERRITIN, TIBC, IRON, RETICCTPCT in the last 72 hours. Coagulation: No results for input(s): LABPROT, INR in the last 72 hours. Urine Drug Screen: Drugs of Abuse     Component Value Date/Time   LABOPIA NONE DETECTED 05/06/2014 1225   COCAINSCRNUR NONE DETECTED 05/06/2014 1225   LABBENZ POSITIVE (A) 05/06/2014 1225   AMPHETMU NONE DETECTED 05/06/2014 1225   THCU NONE DETECTED 05/06/2014 1225   LABBARB NONE DETECTED 05/06/2014 1225    Alcohol Level: No results for input(s): ETH in the last 72 hours. Urinalysis: No results for input(s): COLORURINE, LABSPEC, PHURINE, GLUCOSEU, HGBUR, BILIRUBINUR, KETONESUR, PROTEINUR, UROBILINOGEN, NITRITE, LEUKOCYTESUR in the last 72 hours.  Invalid input(s):  APPERANCEUR Misc. Labs:   ABGS: No results for input(s): PHART, PO2ART, TCO2, HCO3 in the last 72 hours.  Invalid input(s): PCO2   MICROBIOLOGY: No results found for this or any previous visit (from the past 240 hour(s)).  Studies/Results: Ct Chest W Contrast  Result Date: 07/27/2018 CLINICAL DATA:  Pneumonia.  Hx of HTN,COPD/bbj EXAM: CT CHEST WITH CONTRAST TECHNIQUE: Multidetector CT imaging of the chest was performed during intravenous contrast administration. CONTRAST:  69mL OMNIPAQUE IOHEXOL 300 MG/ML  SOLN  COMPARISON:  07/23/2018 chest x-ray and 03/07/2015 chest CT FINDINGS: Cardiovascular: There is dense atherosclerotic calcification of the coronary arteries. No pericardial effusion. Heart size is normal. There is minimal calcification of the thoracic aorta not associated with aneurysm. The appearance of the pulmonary arteries is unremarkable accounting for the contrast bolus timing. Mediastinum/Nodes: Thyroid is unremarkable. Numerous enlarged mediastinal lymph nodes. Pretracheal lymph node is 1.5 centimeters. Precarinal lymph node is 1.5 centimeters. Confluent subcarinal mass/adenopathy is 1.9 x 6.5 centimeters, including the esophagus which is encased. Large prevascular lymph nodes, largest measuring 3.1 centimeters. RIGHT infrahilar mass 2.2 centimeters. RIGHT hilar lymph nodes are 1.3 and 1.9 centimeters. Confluent opacity in the LEFT hilum precludes measurement of discrete nodes. Lungs/Pleura: Bilateral pleural effusions, LEFT greater than RIGHT. There is extrinsic effacement of the LEFT mainstem bronchus secondary to mediastinal adenopathy. There is dense opacity within the LEFT hilar region measuring 5.1 x 8.3 centimeters on coronal images. There is consolidation in the LOWER lobes bilaterally, LEFT greater than RIGHT. Focal discrete areas of airspace filling identified in the RIGHT UPPER and LOWER lobes. Extensive irregular thickening of the septal lines seen throughout the LEFT lung,  more confluent opacity near the hilar regions and bases. Discrete nodule in the LEFT LOWER lobe is 6 millimeters on image 77 of series 4. Upper Abdomen: Partially imaged cyst within the UPPER pole of the LEFT kidney is 1.5 centimeters in diameter. The imaged portions of the adrenal glands are unremarkable. Question of colonic wall thickening in the region of the splenic flexure, incompletely evaluated on this study of the chest. There is atherosclerotic calcification of the abdominal aorta. Musculoskeletal: Irregularity and sclerosis involving the manubrium of the sternum. Findings are consistent with remote fracture, new since the prior CT exam in 2016. There are numerous old rib fractures. No definitive lytic or blastic lesions. IMPRESSION: 1. Confluent masslike density in the LEFT hilar region, associated with extrinsic narrowing of the LEFT mainstem bronchus measuring up to 8.3 centimeters. There is associated significant mediastinal and hilar adenopathy. Significant septal wall thickening throughout the LEFT lung and associated pleural effusion is suspicious for lymphangitic spread of tumor. 2. Focal areas of airspace filling in the RIGHT lung may be infection or tumor. 3. Remote sternal fracture. 4. Numerous old rib fractures. 5. Question of colonic wall thickening in the region of the splenic flexure. This is incompletely evaluated but raises a question of colonic lesion or colitis. Consider further evaluation with CT of the abdomen and pelvis. Intravenous and Oral contrast are recommended unless contraindicated. 6. Coronary artery disease. 7. Partially imaged LEFT renal cyst. 8.  Aortic atherosclerosis.  (ICD10-I70.0) coronary artery disease. These results will be called to the ordering clinician or representative by the Radiologist Assistant, and communication documented in the PACS or zVision Dashboard. Electronically Signed   By: Nolon Nations M.D.   On: 07/27/2018 20:00    Medications:  Prior to  Admission:  Medications Prior to Admission  Medication Sig Dispense Refill Last Dose  . amLODipine (NORVASC) 5 MG tablet Take 5 mg by mouth daily.   Past Month at Unknown time  . [EXPIRED] busPIRone (BUSPAR) 5 MG tablet Take 1 tablet (5 mg total) by mouth 2 (two) times daily for 30 days. 60 tablet 0 Past Month at Unknown time  . feeding supplement, ENSURE ENLIVE, (ENSURE ENLIVE) LIQD Take 237 mLs by mouth 2 (two) times daily between meals. 237 mL 12 Past Week at Unknown time  . [EXPIRED] folic acid (FOLVITE) 1 MG tablet Take  1 tablet (1 mg total) by mouth daily for 30 days. 30 tablet 0 Past Month at Unknown time  . gabapentin (NEURONTIN) 300 MG capsule Take 1 capsule (300 mg total) by mouth 3 (three) times daily for 30 days. 90 capsule 0 Past Month at Unknown time  . levothyroxine (SYNTHROID, LEVOTHROID) 50 MCG tablet Take 50 mcg by mouth every morning.   11 Past Month at Unknown time  . methadone (DOLOPHINE) 10 MG tablet Take 10 mg by mouth every 8 (eight) hours.   Past Week at Unknown time  . metoprolol tartrate (LOPRESSOR) 25 MG tablet Take 25 mg by mouth 2 (two) times daily.    Past Month at Unknown time  . nitroGLYCERIN (NITROSTAT) 0.4 MG SL tablet Place 0.4 mg under the tongue every 5 (five) minutes as needed for chest pain.    unknown  . oxybutynin (DITROPAN) 5 MG tablet Take 5 mg by mouth daily.   Past Month at Unknown time  . pantoprazole (PROTONIX) 40 MG tablet TAKE ONE TABLET BY MOUTH TWICE DAILY. TAKE BEFORE A MEAL. (MORNING ,EVENING) (Patient taking differently: Take 40 mg by mouth 2 (two) times daily. ) 60 tablet 5 Past Month at Unknown time  . PARoxetine (PAXIL) 20 MG tablet Take 1 tablet (20 mg total) by mouth every morning. 30 tablet 0 Past Month at Unknown time  . pravastatin (PRAVACHOL) 40 MG tablet Take 1 tablet (40 mg total) by mouth at bedtime. (Patient taking differently: Take 40 mg by mouth every morning. ) 30 tablet 2 Past Month at Unknown time  . PROAIR HFA 108 (90 BASE)  MCG/ACT inhaler Inhale 2 puffs into the lungs 4 (four) times daily as needed for wheezing or shortness of breath.   5 unknown  . topiramate (TOPAMAX) 100 MG tablet Take 1 tablet (100 mg total) by mouth daily. 30 tablet 0 Past Week at Unknown time  . traZODone (DESYREL) 50 MG tablet Take 50 mg by mouth at bedtime.   Past Month at Unknown time  . Ascorbic Acid (VITAMIN C) 1000 MG tablet Take 1,000 mg by mouth daily.     unknown  . Aspirin-Salicylamide-Caffeine 660-630-16.0 MG PACK Take 1 Package by mouth as needed.   unknown  . B Complex-C (SUPER B COMPLEX PO) Take 1 tablet by mouth daily.    unknown  . ferrous gluconate (FERGON) 324 MG tablet Take 1 tablet (324 mg total) by mouth daily with breakfast for 30 days. 30 tablet 0 unknown  . fish oil-omega-3 fatty acids 1000 MG capsule Take 2 g by mouth daily.     unknown  . Multiple Vitamin (MULTIVITAMIN) capsule Take 1 capsule by mouth daily.     unknown  . vitamin A 10000 UNIT capsule Take 8,000 Units by mouth daily.   unknown  . vitamin E 200 UNIT capsule Take 400 Units by mouth daily.   unknown   Scheduled: . acetaminophen  650 mg Oral Q6H   Or  . acetaminophen  650 mg Rectal Q6H  . amLODipine  5 mg Oral Daily  . busPIRone  5 mg Oral BID  . calcitonin  4 Units/kg Intramuscular BID  . enoxaparin (LOVENOX) injection  40 mg Subcutaneous Q24H  . feeding supplement (ENSURE ENLIVE)  237 mL Oral BID BM  . ferrous gluconate  324 mg Oral Q breakfast  . folic acid  1 mg Oral Daily  . gabapentin  200 mg Oral TID  . ipratropium-albuterol  3 mL Nebulization Q6H WA  .  levothyroxine  50 mcg Oral q morning - 10a  . mouth rinse  15 mL Mouth Rinse BID  . metoprolol tartrate  25 mg Oral BID  . multivitamin with minerals  1 tablet Oral Daily  . oxybutynin  5 mg Oral Daily  . oxyCODONE  2.5 mg Oral Q6H  . pantoprazole  40 mg Oral BID  . PARoxetine  20 mg Oral q morning - 10a  . phosphorus  500 mg Oral TID WC & HS  . pravastatin  40 mg Oral q morning - 10a   . topiramate  100 mg Oral Daily  . traZODone  50 mg Oral QHS   Continuous: . sodium chloride 500 mL (07/23/18 1823)  . 0.9 % NaCl with KCl 20 mEq / L    . azithromycin 500 mg (07/28/18 2214)  . ceFEPime (MAXIPIME) IV 2 g (07/28/18 1754)   BCW:UGQBVQ chloride, fentaNYL (SUBLIMAZE) injection, nitroGLYCERIN  Assesment: She was admitted with pneumonia.  She was found to have a masslike lesion in the left lung.  I have personally reviewed the films.  It does not look like it has invaded the bronchus but that could better be determined on CT.  CT which I have also personally reviewed is consistent with the chest x-ray.  It looks like extrinsic narrowing of the left mainstem bronchus with a large masslike density and what appears to be lymphangitic spread of tumor.  Per the chart she is not interested in surgery or chemo.  She was not able to discuss that with me today.  She has acute hypoxic respiratory failure and had significant confusion related to that which apparently has improved.  However she is very sleepy now so I am going to go ahead and order a blood gas to be sure that were not dealing with CO2 narcosis  She has COPD exacerbation which is being treated with Solu-Medrol bronchodilators.   Principal Problem:   CAP (community acquired pneumonia) Active Problems:   Essential hypertension   Tobacco abuse   Hypothyroidism   GERD (gastroesophageal reflux disease)   Altered mental status   Normocytic anemia   Hyperlipidemia   COPD exacerbation ([place])    Plan: Check a blood gas.  Continue other treatments.  See if she has any interest in treatment of presumed primary lung cancer.    LOS: 6 days   Alonza Bogus 07/29/2018, 9:13 AM

## 2018-07-29 NOTE — Consult Note (Signed)
Consultation Note Date: 07/29/2018   Patient Name: Ashley Hall  DOB: 04/23/1946  MRN: [id number]  Age / Sex: 73 y.o., female  PCP: Lucia Gaskins, MD Referring Physician: Murlean Iba, MD  Reason for Consultation: Establishing goals of care and New dx lung cancer  HPI/Patient Profile: 73 y.o. female  with past medical history of COPD, hypertension, alcohol abuse in remission, personality disorder, chronic back pain, chronic constipation, acid reflux, appendectomy, admitted on 07/23/2018 with acute respiratory failure with hypoxia, healthcare associated pneumonia, newly found lung mass.  PMT consulted for new diagnosis lung cancer, goals of care, patient not interested in treatment .  Clinical Assessment and Goals of Care: Mrs. Markoff is resting quietly in bed, she will make and somewhat keep eye contact.  There is no family at bedside. Mrs. Dodge is alert but confused, oriented to self only.  She tells me that she is having pain, and when asked about why she is in the hospital, she continues to talk about her pain.    Call to niece/HC surrogate Raye Sorrow.  We talk about Mrs. Napp's health issues and Marylin Crosby was unaware of new diagnosis of likely cancer.  New diagnosis shared gently.  We talk about obstructive PNE and treatment plan.   We also talk in detail about Mrs. Brazeau's decision to not seek further testing.  Conference with Dr. Wynetta Emery who shares that Mrs. Phillips understood her diagnosis and still declined further testing or treatment. Lorenda Ishihara states she can convince Mrs. Delsignore to take testing, and I share that sometimes people don't want to know, and Luann agrees.   Otho Perl is tearful and shares that Mrs. Crigler looks like her own mother did when she was dying of metastatic lung cancer.    We plan for family meeting at 16 3/3 at bedside.   HCPOA   NEXT OF KIN   Mrs. Torbert names her niece  Raye Sorrow as her surrogate.  Marylin Crosby states that Mrs. Alred has an adult son who is not involved in her care. No paperwork in document section of Epic chart.    SUMMARY OF RECOMMENDATIONS   24-48 hours for outcomes.  Family meeting with Leann 3/3 at 10am.   Code Status/Advance Care Planning:  Full code - niece Marylin Crosby shares that Mrs. Poch has not completed DNR. PMT will continue to discuss.   Symptom Management:   Per hospitalist, no additional needs.   Palliative Prophylaxis:   Frequent Pain Assessment and Turn Reposition  Additional Recommendations (Limitations, Scope, Preferences):  declines cancer treatments.   Psycho-social/Spiritual:   Desire for further Chaplaincy support:no  Additional Recommendations: Caregiving  Support/Resources and Education on Hospice  Prognosis:   < 3 months, would not be surprising based on recent functional decline, new diagnosis of likely cancer.   Discharge Planning: to be determined, based on outcomes, patient choice.      Primary Diagnoses: Present on Admission: . CAP (community acquired pneumonia) . Essential hypertension . GERD (gastroesophageal reflux disease) . Tobacco abuse . Altered mental status .  Normocytic anemia . Hypothyroidism . Hyperlipidemia . COPD exacerbation ([place])   I have reviewed the medical record, interviewed the patient and family, and examined the patient. The following aspects are pertinent.  Past Medical History:  Diagnosis Date  . Acid reflux   . Alcohol abuse, in remission   . Arthritis   . B12 DEFICIENCY 06/15/2009   Qualifier: Diagnosis of  By: Delfino Lovett    . Bipolar 1 disorder ([place])   . Chronic back pain    Dr Merlene Laughter  . Chronic constipation   . COPD (chronic obstructive pulmonary disease) ([place])   . Depression   . ETOH abuse   . Hyperlipidemia 07/24/2018  . Hypertension   . Neurotic depression   . Personality disorder ([place])   . S/P colonoscopy 12/28/05   Dr Catalina Antigua prep,  lipoma left colon  . 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 Seizures ([place])   . Sinus drainage   . Tobacco abuse 05/06/2014   Social History   Socioeconomic History  . Marital status: Divorced    Spouse name: Not on file  . Number of children: 1  . Years of education: Not on file  . Highest education level: Not on file  Occupational History  . Occupation: disabled  Social Needs  . Financial resource strain: Not on file  . Food insecurity:    Worry: Not on file    Inability: Not on file  . Transportation needs:    Medical: Not on file    Non-medical: Not on file  Tobacco Use  . Smoking status: Heavy Tobacco Smoker    Packs/day: 0.50    Years: 48.00    Pack years: 24.00    Types: Cigarettes  . Smokeless tobacco: Never Used  . Tobacco comment: smoke the fake cigarettes now  Substance and Sexual Activity  . Alcohol use: Yes    Comment: heavy etoh x36yrs, quit 2004  . Drug use: No    Frequency: 5.0 times per week  . Sexual activity: Not on file  Lifestyle  . Physical activity:    Days per week: Not on file    Minutes per session: Not on file  . Stress: Not on file  Relationships  . Social connections:    Talks on phone: Not on file    Gets together: Not on file    Attends religious service: Not on file    Active member of club or organization: Not on file    Attends meetings of clubs or organizations: Not on file    Relationship status: Not on file  Other Topics Concern  . Not on file  Social History Narrative   Lives alone   Family History  Problem Relation Age of Onset  . Heart attack Mother        Deceased  . Diabetes Mother        Deceased  . Cancer Father        Deceased  . Schizophrenia Brother    Scheduled Meds: . acetaminophen  650 mg Oral Q6H   Or  . acetaminophen  650 mg Rectal Q6H  . amLODipine  5 mg Oral Daily  . busPIRone  5 mg Oral BID  . calcitonin  4 Units/kg Intramuscular BID  . enoxaparin (LOVENOX) injection  40 mg  Subcutaneous Q24H  . feeding supplement (ENSURE ENLIVE)  237 mL Oral BID BM  . ferrous gluconate  324 mg Oral Q breakfast  . folic acid  1 mg Oral  Daily  . gabapentin  200 mg Oral TID  . ipratropium-albuterol  3 mL Nebulization Q6H WA  . levothyroxine  50 mcg Oral q morning - 10a  . mouth rinse  15 mL Mouth Rinse BID  . metoprolol tartrate  25 mg Oral BID  . multivitamin with minerals  1 tablet Oral Daily  . oxybutynin  5 mg Oral Daily  . oxyCODONE  2.5 mg Oral Q6H  . pantoprazole  40 mg Oral BID  . PARoxetine  20 mg Oral q morning - 10a  . phosphorus  500 mg Oral TID WC & HS  . pravastatin  40 mg Oral q morning - 10a  . topiramate  100 mg Oral Daily  . traZODone  50 mg Oral QHS   Continuous Infusions: . sodium chloride 500 mL (07/23/18 1823)  . 0.9 % NaCl with KCl 20 mEq / L    . azithromycin 500 mg (07/28/18 2214)  . ceFEPime (MAXIPIME) IV 2 g (07/28/18 1754)   PRN Meds:.sodium chloride, fentaNYL (SUBLIMAZE) injection, nitroGLYCERIN Medications Prior to Admission:  Prior to Admission medications   Medication Sig Start Date End Date Taking? Authorizing Provider  amLODipine (NORVASC) 5 MG tablet Take 5 mg by mouth daily.   Yes [provider]  feeding supplement, ENSURE ENLIVE, (ENSURE ENLIVE) LIQD Take 237 mLs by mouth 2 (two) times daily between meals. 06/26/18  Yes Shah, Pratik D, DO  gabapentin (NEURONTIN) 300 MG capsule Take 1 capsule (300 mg total) by mouth 3 (three) times daily for 30 days. 06/27/18 07/27/18 Yes Shah, Pratik D, DO  levothyroxine (SYNTHROID, LEVOTHROID) 50 MCG tablet Take 50 mcg by mouth every morning.  04/08/14  Yes [provider]  methadone (DOLOPHINE) 10 MG tablet Take 10 mg by mouth every 8 (eight) hours.   Yes [provider]  metoprolol tartrate (LOPRESSOR) 25 MG tablet Take 25 mg by mouth 2 (two) times daily.  11/16/10  Yes [provider]  nitroGLYCERIN (NITROSTAT) 0.4 MG SL tablet Place 0.4 mg under the tongue every 5  (five) minutes as needed for chest pain.  06/14/18  Yes [provider]  oxybutynin (DITROPAN) 5 MG tablet Take 5 mg by mouth daily. 06/08/18  Yes [provider]  pantoprazole (PROTONIX) 40 MG tablet TAKE ONE TABLET BY MOUTH TWICE DAILY. TAKE BEFORE A MEAL. (MORNING ,EVENING) Patient taking differently: Take 40 mg by mouth 2 (two) times daily.  01/24/18  Yes Setzer, Terri L, NP  PARoxetine (PAXIL) 20 MG tablet Take 1 tablet (20 mg total) by mouth every morning. 06/27/18  Yes Shah, Pratik D, DO  pravastatin (PRAVACHOL) 40 MG tablet Take 1 tablet (40 mg total) by mouth at bedtime. Patient taking differently: Take 40 mg by mouth every morning.  05/08/14  Yes Lucia Gaskins, MD  PROAIR HFA 108 (90 BASE) MCG/ACT inhaler Inhale 2 puffs into the lungs 4 (four) times daily as needed for wheezing or shortness of breath.  09/15/14  Yes [provider]  topiramate (TOPAMAX) 100 MG tablet Take 1 tablet (100 mg total) by mouth daily. 06/27/18  Yes Shah, Pratik D, DO  traZODone (DESYREL) 50 MG tablet Take 50 mg by mouth at bedtime. 07/17/18  Yes [provider]  Ascorbic Acid (VITAMIN C) 1000 MG tablet Take 1,000 mg by mouth daily.      [provider]  Aspirin-Salicylamide-Caffeine 176-160-73.7 MG PACK Take 1 Package by mouth as needed.    [provider]  B Complex-C ([place]  PO) Take 1 tablet by mouth daily.     [provider]  ferrous gluconate (FERGON) 324 MG tablet Take 1 tablet (324 mg total) by mouth daily with breakfast for 30 days. 06/26/18 07/26/18  Manuella Ghazi, Pratik D, DO  fish oil-omega-3 fatty acids 1000 MG capsule Take 2 g by mouth daily.      [provider]  Multiple Vitamin (MULTIVITAMIN) capsule Take 1 capsule by mouth daily.      [provider]  vitamin A 10000 UNIT capsule Take 8,000 Units by mouth daily.    [provider]  vitamin E 200 UNIT capsule Take 400 Units by mouth daily.    [provider]   Allergies  Allergen Reactions  . Pregabalin     REACTION: "couldn't move"   Review of Systems  Unable to perform ROS: Mental status change    Physical Exam Vitals signs and nursing note reviewed.  HENT:     Head: Atraumatic.  Cardiovascular:     Rate and Rhythm: Normal rate.  Pulmonary:     Effort: Pulmonary effort is normal. No tachypnea.  Abdominal:     Palpations: Abdomen is soft.     Tenderness: There is guarding.  Skin:    General: Skin is warm and dry.  Neurological:     Mental Status: She is alert.     Comments: Alert but confused, oriented to self only.  Cannot follow a logical thought sequence  Psychiatric:     Comments: Calm, not fearful     Vital Signs: BP (!) 153/62 (BP Location: Right Arm)   Pulse 83   Temp 98 F (36.7 C) (Oral)   Resp 20   Ht 5\' 2"  (1.575 m)   Wt 53.5 kg Comment: Bed weight  SpO2 95%   BMI 21.57 kg/m  Pain Scale: 0-10 POSS *See Group Information*: 1-Acceptable,Awake and alert Pain Score: Asleep   SpO2: SpO2: 95 % O2 Device:SpO2: 95 % O2 Flow Rate: .O2 Flow Rate (L/min): 2 L/min  IO: Intake/output summary:   Intake/Output Summary (Last 24 hours) at 07/29/2018 0915 Last data filed at 07/28/2018 1900 Gross per 24 hour  Intake 3609.84 ml  Output 1350 ml  Net 2259.84 ml    LBM: Last BM Date: 07/27/18 Baseline Weight: Weight: 50 kg Most recent weight: Weight: 53.5 kg(Bed weight)     Palliative Assessment/Data:   Flowsheet Rows     Most Recent Value  Intake Tab  Referral Department  Hospitalist  Unit at Time of Referral  Med/Surg Unit  Palliative Care Primary Diagnosis  Cancer  Date Notified  07/28/18  Palliative Care Type  New Palliative care  Reason for referral  Clarify Goals of Care, Advance Care Planning  Date of Admission  07/23/18  Date first seen by Palliative Care  07/29/18  # of days Palliative referral response time  1 Day(s)  # of days IP prior to Palliative referral  5  Clinical  Assessment  Psychosocial & Spiritual Assessment  Palliative Care Outcomes      Time In: 1030 Time Out: 1150 Time Total: 70 minutes Greater than 50%  of this time was spent counseling and coordinating care related to the above assessment and plan.  Signed by: Drue Novel, NP   Please contact Palliative Medicine Team phone at [phone] for questions and concerns.  For individual provider: See Shea Evans

## 2018-07-30 DIAGNOSIS — R918 Other nonspecific abnormal finding of lung field: Secondary | ICD-10-CM

## 2018-07-30 DIAGNOSIS — Z7189 Other specified counseling: Secondary | ICD-10-CM

## 2018-07-30 LAB — RENAL FUNCTION PANEL
ANION GAP: 6 (ref 5–15)
Albumin: 2.3 g/dL — ABNORMAL LOW (ref 3.5–5.0)
BUN: 8 mg/dL (ref 8–23)
CALCIUM: 10.1 mg/dL (ref 8.9–10.3)
CO2: 19 mmol/L — ABNORMAL LOW (ref 22–32)
Chloride: 112 mmol/L — ABNORMAL HIGH (ref 98–111)
Creatinine, Ser: 0.64 mg/dL (ref 0.44–1.00)
GFR calc Af Amer: >60 mL/min (ref >60)
GFR calc non Af Amer: >60 mL/min (ref >60)
Glucose, Bld: 117 mg/dL — ABNORMAL HIGH (ref 70–99)
Phosphorus: 2.4 mg/dL — ABNORMAL LOW (ref 2.5–4.6)
Potassium: 3.5 mmol/L (ref 3.5–5.1)
Sodium: 137 mmol/L (ref 135–145)

## 2018-07-30 LAB — C DIFFICILE QUICK SCREEN W PCR REFLEX
C DIFFICILE (CDIFF) INTERP: DETECTED
C Diff antigen: POSITIVE — AB
C Diff toxin: POSITIVE — AB

## 2018-07-30 LAB — PTH-RELATED PEPTIDE: PTH-related peptide: 3.4 pmol/L

## 2018-07-30 MED ORDER — METHYLPREDNISOLONE SODIUM SUCC 40 MG IJ SOLR
20.0000 mg | INTRAMUSCULAR | Status: DC
  Administered 2018-07-31: 20 mg via INTRAVENOUS
  Filled 2018-07-30: qty 1

## 2018-07-30 MED ORDER — MAGIC MOUTHWASH
5.0000 mL | Freq: Three times a day (TID) | ORAL | Status: DC
  Administered 2018-07-30 – 2018-07-31 (×3): 5 mL via ORAL
  Filled 2018-07-30 (×3): qty 5

## 2018-07-30 MED ORDER — METHYLPREDNISOLONE SODIUM SUCC 40 MG IJ SOLR: 40.0000 mg | INTRAMUSCULAR | Status: DC

## 2018-07-30 MED ORDER — VANCOMYCIN 50 MG/ML ORAL SOLUTION
125.0000 mg | Freq: Four times a day (QID) | ORAL | Status: DC
  Administered 2018-07-30 – 2018-07-31 (×5): 125 mg via ORAL
  Filled 2018-07-30 (×14): qty 2.5

## 2018-07-30 NOTE — Care Management (Signed)
CM discussed plans with Palliative NP and niece, Laddie Aquas. Plan for home with [facility]. Niece says family has plan for supervision VERY CLOSE to if not for 24/7. Pt will cont to have her aid. Pt will need oxygen and hospital bed. Pt's niece plans to take  Pt home via care on 3/4/202. CM has faxed referral and communicated plan to Cassandra, [facility] rep.

## 2018-07-30 NOTE — Progress Notes (Signed)
Following peripherally.  She is being treated for healthcare associated pneumonia but has a new lung mass that looks like a lung cancer.  She told Dr. Wynetta Emery that she did not want any treatment but palliative care is involved in trying to be sure about goals.  If she were to change her mind it would look like she may need mediastinoscopy/bronchoscopy or lymph node biopsy.  She is not a good candidate for any sort of highly invasive procedure because of her generalized weakness.  I will be out of town starting this evening and will return on 08/05/2018

## 2018-07-30 NOTE — Care Management (Addendum)
Patient Information   SS# [id number]  Patient Name Ashley Hall, Ashley Hall ([id number]) Sex Female DOB 07/27/1945  Room Bed  A325 A325-01  Patient Demographics   Address [place] [address] Phone [phone] (Home) [phone] (Mobile)  Patient Ethnicity & Race   Ethnic Group Patient Race  Not Hispanic or Latino White or Caucasian  Emergency Contact(s)   Name Relation Home Work Mobile  Angel,Leann Niece [phone]    Documents on File    Status Date Received Description  Documents for the Patient  EMR Medication Summary Not Received    EMR Problem Summary Not Received    EMR Patient Summary Not Received    Historic Radiology Documentation Not Received    Release of Information Not Received    [place] Received 11/07/10   [place] E-Signature HIPAA Notice of Privacy Received 04/22/12   [place] E-Signature HIPAA Notice of Privacy Spanish Not Received    Driver's License Not Received    Insurance Card Received 11/07/10   Advance Directives/Living Will/HCPOA/POA Not Received    Insurance Card Not Received    [place] HIPAA NOTICE OF PRIVACY - Scanned Not Received    Insurance Card Not Received    Financial Application Not Received    AMB Correspondence Not Received  Rx Appv 07/12 Prescription Sol  Release of Information Not Received    HIM ROI Authorization Not Received (Expired)  CHL abs for cont of care  Release of Information Not Received    HIM ROI Authorization Not Received    Release of Information Not Received    Insurance Card Received 12/12/12   Insurance Card Not Received    HIM ROI Authorization  09/18/13   Release of Information  09/30/13   Release of Information  09/30/13   Other Photo ID Not Received    HIM ROI Authorization  04/25/15 Managed Care Request  Insurance Card Received 06/13/16 [facility] MEDICARE/RCGD/HHS  Insurance Card Received 06/13/16 MEDICAID/RCGD/HHS  HIM ROI  Authorization  06/13/16 RCGD--06/13/16 prog note to PCP (dondiego)  [employer] Plan+Medicaid/ROSM  Release of Information   DPR/ROSM  Release of Information Received 06/13/16 DPR/NS & CX FORM/RCGD/HHS  AMB HH/NH/Hospice  07/11/16 EPISODE SUMMARY REPORT [place]  AMB HH/NH/Hospice  78/93/81 [place] (Expired) 12/04/17 Authorization for batch [employer]IO[employer] 2019 HEDIS Review    fbg   12/04/17  [place] HIPAA NOTICE OF PRIVACY - Scanned Received 06/19/18 Pt unable to signed any forms due to medical condition  Patient Photo   Photo of Patient  Documents for the Encounter  EMS Run Sheet Received 07/23/18   AOB (Assignment of Insurance Benefits) Received 07/23/18 Pt unable to signed any forms due to medical condition and no family present  E-signature AOB     MEDICARE RIGHTS Received 07/23/18 Pt unable to signed any forms due to medical condition and no family present  E-signature Medicare Rights     ED Patient Billing Extract   ED PB Billing Extract  Cardiac Monitoring Strip Received 07/23/18   Cardiac Monitoring Strip Shift Summary Received 07/23/18   Care Management Received 07/25/18   EKG Received (Deleted) 07/24/18   EKG Received 07/24/18   Admission Information   Current Information   Attending Provider Admitting Provider Admission Type Admission Status  Murlean Iba, MD Reubin Milan, MD Emergency Admission (Confirmed)       Admission  Date/Time Discharge Date Hospital Service Auth/Cert Status  43/20/03 01:46 PM  Internal Medicine Incomplete       Hospital Area Unit Room/Bed   [facility] AP-DEPT 300 A325/A325-01        Admission   Complaint  SOB  Hospital Account   Name Acct ID Class Status Primary Coverage  Ashley Hall, Ashley Hall [id number] Inpatient [place]      Guarantor Account (for Hospital Account  [id number])   Name Relation to Pt Service Area Active? Acct Type  Ashley Hall Self CHSA Yes Personal/Family  Address Phone    [address] [place], [place] [address] [phone]) [phone])        Coverage Information (for Hospital Account [id number])   1. Ashley Hall MEDICARE   F/O Payor/Plan Precert #  [facility] #  Ashley Hall, Ashley Hall [id number]  Address [place] [address] [address] [phone]  2. MEDICAID [place]/MEDICAID OF [place]   F/O Payor/Plan Precert #  MEDICAID [place]/MEDICAID OF [place]   Subscriber Subscriber #  Ashley Hall, Ashley Hall [id number]  Address Phone  [address] [place], [place] [address] [phone]       Care Everywhere ID:  [id number]

## 2018-07-30 NOTE — Progress Notes (Signed)
Palliative: [name], Ashley Hall, is resting quietly in bed.  Her niece Ashley Hall is at bedside.  Ashley Hall is able to tell me who she is, where we are, and that the month is March, but she cannot complete higher thought processes.  I share this concern with Ashley Hall.  Ashley Hall and I talked about Ashley Hall's health concerns.  We asked Ashley Hall what the doctor told her this morning, but she replies that she told him she was tired, and not a morning person.  I asked Ashley Hall if I can share some hard news with her, she agrees.  We talked about what is likely cancer in her left lung causing recurrent pneumonia.  Ashley Hall does not seem surprised by this, and does not ask any questions.  Ashley Hall and I talked about the chronic illness pathway, what is normal and expected.  We talked about the difference when someone has cancer.  We talked about what is important to Ashley Hall, what she does and does not want.  She is not able to answer direct questions about cancer treatments, although she shared with the hospitalist that she did not want further testing or treatment for what we believe is cancer.  When asked about how we should care for her she continues to share her desire to go home, to be unburden from IV fluids and be made comfortable.  I encourage Ashley Hall that Ashley Hall will need 24/7 care at home, she states understanding.  Ashley Hall and I talked about logistics of care at home.  She shares that her mother died with Ashley care and she would like Ashley Hall to provide treat the treatable care, but no cancer testing or treatment.  Ashley Hall has had 6 hours/day aid through [facility] for several years now, and I reassure Ashley Hall that this will continue.  I encouraged her to lean on Ashley provider for all questions and needs including pain management in the future.  We talked about preferred place of death as home, we talked about do not rehospitalize, Ashley Hall allowing natural death (DNR).  Ashley Hall form  completed and placed on chart.  [facility] request conference with hospitalist, Ashley Hall.  He and I spent time reassuring Ashley Hall that home with Ashley is a kind and loving choice.  Ashley Hall gets a text page that Ashley Hall is positive for C. difficile, treatment plan discussed briefly with Ashley Hall.  I share that I expect more weight loss and frailty, more pain, as cancer increases.  Secure chat with case management related to disposition.  75 minutes, extended time Ashley Axe, NP  Palliative Medicine Team  Team Phone # [phone]  Greater than 50% of this time was spent counseling and coordinating care related to the above assessment and plan

## 2018-07-30 NOTE — Progress Notes (Signed)
PROGRESS NOTE  Ashley Hall  IZT:[id number] DOB: July 27, 1945 DOA: 07/23/2018 PCP: Lucia Gaskins, MD    Brief Narrative:  73 year old female with a history of COPD, chronic pain syndrome from scoliosis, was recently discharged from skilled nursing facility and brought to the emergency room with shortness of breath.  She was found by her home health physical therapist with an oxygen saturation 79% on room air.  She was admitted for acute on respiratory failure with hypoxia secondary to healthcare associated pneumonia.  Chest x-ray did show bilateral infiltrates.  She is been started on intravenous antibiotics.  Assessment & Plan:   Principal Problem:   CAP (community acquired pneumonia) Active Problems:   Essential hypertension   Tobacco abuse   Hypothyroidism   GERD (gastroesophageal reflux disease)   Altered mental status   Normocytic anemia   Hyperlipidemia   COPD exacerbation (HCC)   Goals of care, counseling/discussion   Palliative care by specialist   DNR (do not resuscitate) discussion   Hypoxia   Lung mass  1. Acute respiratory failure with hypoxia.  Secondary to pneumonia and presumed metastatic lung cancer.   2. Healthcare associated pneumonia.  Initially started on broad-spectrum antibiotics with cefepime and azithromycin.  3. C diff infection - oral vancomycin started 3/3 for 10 day course.   4. COPD exacerbation.  Continues to have rhonchi and wheezing.  Continue on intravenous Solu-Medrol.  Continue bronchodilators.  5. Newly found Lung Mass - CT chest with hilar mass  / left bronchus involvement suspicious lymphangitic spread, Pt declined the CT abdomen/pelvis with contrast, Consulted pulmonology however patient not amenable to having procedures.  I spoke with patient about this and she is not interested in any type of surgery or chemotherapy at this time and she did not seem interested in pursuing further tissue diagnosis.  She declined further CT scanning.   Appreciate palliative consult to discuss goals of care.  Met with niece today with palliative team and decision was made to stop IV fluids, focus on comfort and anticipate home with hospice services.   6. Acute encephalopathy related to hypoxia.  She has been waxing and waning with her mentation. This has somewhat improved with oxygen supplementation.  CT scan of her head did not show any acute changes. 7. 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  She is noted to have significant B12 deficiency.  She has been started on replacement therapy.  Hemoglobin has been stable.   8. Hypertension.  Continue metoprolol.  Blood pressure stable. 9. Tobacco use.  Counseled on importance of tobacco cessation.  10. GERD.  Continue on PPI.  11. Hyperlipidemia.  Continue statin. 12. Hypothyroidism.  Continue Synthroid.   13. Chronic pain syndrome.  Continued on home dose of pain medications. 14. Hypokalemia.  Repleted.  15. Hypercalcemia of malignancy -  Mostly likely secondary to undiagnosed malignancy.  IV pamidronate and calcitonin given and calcium levels coming down.  IV hydration completed.     DVT prophylaxis: Lovenox Code Status: Full code Family Communication: No family present, family meeting with palliative care 3/3 Disposition Plan: likely SNF with hospice 3/4  Consultants:     Procedures:     Antimicrobials:   Cefepime 2/26 >  Azithromycin 2/25 >  Subjective: Pt lethargic but arousable. No specific complaints.      Objective: Vitals:   07/29/18 1922 07/29/18 2113 07/30/18 0533 07/30/18 1427  BP:  139/64 (!) 142/63 (!) 141/67  Pulse: 83 88 87 77  Resp: 18  18 19  Temp:  99.1 F (37.3 C) 97.7 F (36.5 C) 99 F (37.2 C)  TempSrc:  Oral Oral Oral  SpO2: 90% 93% 94% 94%  Weight:      Height:        Intake/Output Summary (Last 24 hours) at 07/30/2018 1743 Last data filed at 07/30/2018 0915 Gross per 24 hour  Intake 1153.5 ml  Output 2150 ml  Net -996.5 ml   Filed  Weights   07/23/18 1851 07/23/18 2059 07/24/18 1131  Weight: 50 kg 48.1 kg 53.5 kg    Examination:  General exam: emaciated, chronically ill appearing female, lethargic but arousable.  NAD.  Respiratory system: Bilateral rhonchi unchanged. Respiratory effort normal. Cardiovascular system:normal s1,s2 sounds. No murmurs, rubs, gallops. Gastrointestinal system: Abdomen is nondistended, soft and nontender. No organomegaly or masses felt. Normal bowel sounds heard. Central nervous system: Alert and oriented. No focal neurological deficits. Extremities: No C/C/E, +pedal pulses Skin: No rashes, lesions or ulcers Psychiatry: flat affect.    Data Reviewed: I have personally reviewed following labs and imaging studies  CBC: Recent Labs  Lab 07/24/18 0459 07/25/18 0708 07/26/18 0631 07/29/18 0628  WBC 3.3* 6.9 5.3 8.1  NEUTROABS 2.9 5.1 3.5  --   HGB 10.0* 10.6* 9.4* 8.9*  HCT 34.1* 36.2 31.8* 29.9*  MCV 101.2* 101.4* 98.8 96.1  PLT 178 196 163 026   Basic Metabolic Panel: Recent Labs  Lab 07/24/18 0459 07/25/18 0708 07/26/18 0631 07/27/18 0630 07/28/18 0736 07/29/18 0628 07/30/18 0603  NA 141 141  141 141 143 139 138 137  K 4.1 3.2*  3.2* 3.4* 3.0* 3.8 3.4* 3.5  CL 111 112*  111 113* 113* 110 111 112*  CO2 _0 20* 19*  GLUCOSE 130* 81  81 91 94 94 128* 117*  BUN _1 7* 6* 6* 8  CREATININE 1.05* 0.88  0.86 0.71 0.61 0.71 0.61 0.64  CALCIUM 11.7* 11.8*  11.9* 11.1* 11.3* 12.0* 10.8* 10.1  MG  --   --  1.6*  --  1.6*  --   --   PHOS 2.9 1.7* 1.8*  --  2.6  --  2.4*   GFR: Estimated Creatinine Clearance: 51.8 mL/min (by C-G formula based on SCr of 0.64 mg/dL). Liver Function Tests: Recent Labs  Lab 07/26/18 0631 07/27/18 0630 07/28/18 0736 07/29/18 0628 07/30/18 0603  AST  --  38 42* 36  --   ALT  --  _2 --   ALKPHOS  --  87 120 96  --   BILITOT  --  0.3 0.4 0.3  --   PROT  --  5.6* 6.9 5.7*  --   ALBUMIN 2.3* 2.2* 2.8* 2.3*  2.3*   No results for input(s): LIPASE, AMYLASE in the last 168 hours. No results for input(s): AMMONIA in the last 168 hours. Coagulation Profile: No results for input(s): INR, PROTIME in the last 168 hours. Cardiac Enzymes: No results for input(s): CKTOTAL, CKMB, CKMBINDEX, TROPONINI in the last 168 hours. BNP (last 3 results) No results for input(s): PROBNP in the last 8760 hours. HbA1C: No results for input(s): HGBA1C in the last 72 hours. CBG: No results for input(s): GLUCAP in the last 168 hours. Lipid Profile: No results for input(s): CHOL, HDL, LDLCALC, TRIG, CHOLHDL, LDLDIRECT in the last 72 hours. Thyroid Function Tests: No results for input(s): TSH, T4TOTAL, FREET4, T3FREE, THYROIDAB in the last 72 hours. Anemia Panel: No results for input(s):  VITAMINB12, FOLATE, FERRITIN, TIBC, IRON, RETICCTPCT in the last 72 hours. Sepsis Labs: No results for input(s): PROCALCITON, LATICACIDVEN in the last 168 hours.  Recent Results (from the past 240 hour(s))  C difficile quick scan w PCR reflex     Status: Abnormal   Collection Time: 07/30/18 10:04 AM  Result Value Ref Range Status   C Diff antigen POSITIVE (A) NEGATIVE Final   C Diff toxin POSITIVE (A) NEGATIVE Final    Comment: CRITICAL RESULT CALLED TO, READ BACK BY AND VERIFIED WITH: FOLEY,B_0  BY MATTHEWS, B 3.3.2020    C Diff interpretation Toxin producing C. difficile detected.  Final    Comment: CRITICAL RESULT CALLED TO, READ BACK BY AND VERIFIED WITH: FOLEY,B_1  BY MATTHEWS, B 3.3.2020 Performed at [facility], [address] [place], [place] [address]      Radiology Studies: No results found. Scheduled Meds: . acetaminophen  650 mg Oral Q6H   Or  . acetaminophen  650 mg Rectal Q6H  . amLODipine  5 mg Oral Daily  . busPIRone  5 mg Oral BID  . gabapentin  200 mg Oral TID  . ipratropium-albuterol  3 mL Nebulization Q6H WA  . levothyroxine  50 mcg Oral q morning - 10a  . magic mouthwash  5 mL Oral TID  .  mouth rinse  15 mL Mouth Rinse BID  . [START ON 07/31/2018] methylPREDNISolone (SOLU-MEDROL) injection  20 mg Intravenous Q24H  . metoprolol tartrate  25 mg Oral BID  . oxybutynin  5 mg Oral Daily  . pantoprazole  40 mg Oral BID  . PARoxetine  20 mg Oral q morning - 10a  . phosphorus  500 mg Oral TID WC & HS  . topiramate  100 mg Oral Daily  . traZODone  50 mg Oral QHS  . vancomycin  125 mg Oral QID   Continuous Infusions:   LOS: 7 days   Time spent: 33 mins  Joeli Fenner Wynetta Emery, MD [facility] How to contact the [facility] Attending or Consulting provider [place] or covering provider during after hours [place], for this patient?  1. Check the care team in [facility] and look for a) attending/consulting [facility] provider listed and b) the [facility] team listed 2. Log into [email or web address] and use [place]'s universal password to access. If you do not have the password, please contact the hospital operator. 3. Locate the [facility] provider you are looking for under [facility] and page to a number that you can be directly reached. 4. If you still have difficulty reaching the provider, please page the [facility] (Director on Call) for the Hospitalists listed on amion for assistance.   If 7PM-7AM, please contact night-coverage [email or web address]  07/30/2018, 5:43 PM

## 2018-07-31 DIAGNOSIS — R918 Other nonspecific abnormal finding of lung field: Secondary | ICD-10-CM

## 2018-07-31 DIAGNOSIS — Z515 Encounter for palliative care: Secondary | ICD-10-CM

## 2018-07-31 MED ORDER — VANCOMYCIN 50 MG/ML ORAL SOLUTION: 125.0000 mg | Freq: Four times a day (QID) | ORAL | 0 refills | Status: AC

## 2018-07-31 MED ORDER — OXYCODONE HCL 5 MG PO TABS: 2.5000 mg | ORAL_TABLET | ORAL | 0 refills | Status: AC | PRN

## 2018-07-31 MED ORDER — TRAZODONE HCL 50 MG PO TABS: 25.0000 mg | ORAL_TABLET | Freq: Every day | ORAL | Status: AC

## 2018-07-31 MED ORDER — PREDNISONE 10 MG PO TABS: 10.0000 mg | ORAL_TABLET | Freq: Every day | ORAL | 0 refills | Status: AC

## 2018-07-31 MED ORDER — BUSPIRONE HCL 5 MG PO TABS: 5.0000 mg | ORAL_TABLET | Freq: Two times a day (BID) | ORAL | 0 refills | Status: AC

## 2018-07-31 NOTE — Progress Notes (Signed)
Removed IV-clean, dry, intact. Reviewed d/c paperwork with patient and niece. Reviewed changes in medication and gave preprinted prescription for vancomycin. Ashley Hall wheeled stable patient and belongings to main entrance where she was picked up by her niece to d/c to home.

## 2018-07-31 NOTE — Care Management Important Message (Signed)
Important Message  Patient Details  Name: Ashley Hall MRN: [id number] Date of Birth: May 01, 1946   Medicare Important Message Given:  Yes    Sherald Barge, RN 07/31/2018, 12:19 PM

## 2018-07-31 NOTE — Care Management (Signed)
DC home today with Craig. DC summary faxed to hospice. Pt to DC home via care. No further CM needs noted.

## 2018-07-31 NOTE — Discharge Instructions (Signed)
Hospice Hospice is a service that is designed to provide people who are terminally ill and their families with medical, spiritual, and psychological support. Its aim is to improve your quality of life by keeping you as comfortable as possible in the final stages of life. Who will be my providers when I begin hospice care? Hospice teams often include:  A nurse.  A doctor. The hospice doctor will be available for your care, but you can include your regular doctor or nurse practitioner.  A Education officer, museum.  A counselor.  A religious leader (such as a Clinical biochemist).  A dietitian.  Therapists.  Trained volunteers who can help with care. What services does hospice provide? Hospice services can vary depending on the center or organization. Generally, they include:  Ways to keep you comfortable, such as: ? Providing care in your home or in a home-like setting. ? Working with your family and friends to help meet your needs. ? Allowing you to enjoy the support of loved ones by receiving much of your basic care from family and friends.  Pain relief and symptom management. The staff will supply all necessary medicines and equipment so that you can stay comfortable and alert enough to enjoy the company of your friends and family.  Visits or care from a nurse and doctor. This may include 24-hour on-call services.  Companionship when you are alone.  Allowing you and your family to rest. Hospice staff may do light housekeeping, prepare meals, and run errands.  Counseling. They will make sure your emotional, spiritual, and social needs are being met, as well as those needs of your family members.  Spiritual care. This will be individualized to meet your needs and your family's needs. It may involve: ? Helping you and your family understand the dying process. ? Helping you say goodbye to your family and friends. ? Performing a specific religious ceremony or ritual.  Massage.  Nutrition  therapy.  Physical and occupational therapy.  Short-term inpatient care, if something cannot be managed in the home.  Art or music therapy.  Bereavement support for grieving family members. When should hospice care begin? Most people who use hospice are believed to have less than 6 months to live.  Your family and health care providers can help you decide when hospice services should begin.  If you live longer than 6 months but your condition does not improve, your doctor may be able to approve you for continued hospice care.  If your condition improves, you may discontinue the program. What should I consider before selecting a program? Most hospice programs are run by nonprofit, independent organizations. Some are affiliated with hospitals, nursing homes, or home health care agencies. Hospice programs can take place in your home or at a hospice center, hospital, or skilled nursing facility. When choosing a hospice program, ask the following questions:  What services are available to me?  What services will be offered to my loved ones?  How involved will my loved ones be?  How involved will my health care provider be?  Who makes up the hospice care team? How are they trained or screened?  How will my pain and symptoms be managed?  If my circumstances change, can the services be provided in a different setting, such as my home or in the hospital?  Is the program reviewed and licensed by the state or certified in some other way?  What does it cost? Is it covered by insurance?  If I choose a hospice  center or nursing home, where is the hospice center located? Is it convenient for family and friends?  If I choose a hospice center or nursing home, can my family and friends visit any time?  Will you provide emotional and spiritual support?  Who can my family call with questions? Where can I learn more about hospice? You can learn about existing hospice programs in your area  from your health care providers. You can also read more about hospice online. The websites of the following organizations have helpful information:  [facility] and Palliative Care Organization [facility]): http://www.brown-buchanan.com/  National Association for [facility]): http://massey-hart.com/  Hospice Foundation of [place] ([place]): www.hospicefoundation.org  American Cancer Society (ACS): www.cancer.org  Hospice Net: www.hospicenet.org  Visiting Nurse Associations of [place] (VNAA): www.vnaa.org You may also find more information by contacting the following agencies:  A local agency on aging.  Your local [employer] chapter.  Your state's department of health or social services. Summary  Hospice is a service that is designed to provide people who are terminally ill and their families with medical, spiritual, and psychological support.  Hospice aims to improve your quality of life by keeping you as comfortable as possible in the final stages of life.  Hospice teams often include a doctor, nurse, social worker, counselor, religious leader,dietitian, therapists, and volunteers.  Hospice care generally includes medicine for symptom management, visits from doctors and nurses, physical and occupational therapy, nutrition counseling, spiritual and emotional counseling, caregiver support, and bereavement support for grieving family members.  Hospice programs can take place in your home or at a hospice center, hospital, or skilled nursing facility. This information is not intended to replace advice given to you by your health care provider. Make sure you discuss any questions you have with your health care provider. Document Released: 09/01/2003 Document Revised: 06/06/2016 Document Reviewed: 06/06/2016 Elsevier Interactive Patient Education  2019 [place] care is the physical, emotional, mental, and spiritual care a person receives during the last days,  weeks, or months of life. Care at the end of a person's life requires a team of professionals, which may include:  Health care providers.  A Education officer, museum.  A spiritual adviser. The goal of end-of-life care is to give the patient the highest quality of life possible at the end of life. What are the different types of end-of-life care? There are different options for receiving care at the end of your life. Palliative care This type of care can be delivered at the same time as other treatments. The goal is to manage your symptoms and improve your quality of life, which may include:  Control of pain and other symptoms.  Family support.  Spiritual support.  Emotional and social support.  Comfort. You may need palliative care for months or years to manage a long-term (chronic) disease or condition. Hospice care This is a kind of end-of-life care that may be recommended by your health care providers. Hospice care is usually offered when a person is expected to live for six months or less. Hospice care is designed to provide people who are terminally ill and their families with medical, spiritual, and psychological support. The aim is to improve your quality of life by keeping you as comfortable as possible in the final stages of life. Comfort care This type of care is designed to help meet your basic needs and maintain your overall comfort at the end of your life. This includes:  Caring for your skin.  Making sure that you are breathing well.  Ensuring that you are eating well.  Making sure that you get enough rest.  Making sure that you are at a comfortable body temperature. A plan for comfort care can also address the mental, emotional, and spiritual issues that may come up at the end of your life. Where does end-of-life care take place? End-of-life care can take place wherever you are living, as long as you get the care you need. End-of-life care can happen:  At your  home.  In a nursing home.  In a hospital. You and your loved ones may be able to decide where end-of-life care takes place. This decision depends on:  Your wishes.  Your comfort.  The medical equipment you need. How do I know when it is time for end-of-life care? Your health care provider may tell you that treatments can no longer control your illness. You may also decide that you do not want to undergo the treatments that are available. Talk to your health care provider and your loved ones about your end-of-life care options. If possible, discuss the following topics with your health care provider before you need end-of-life care:  How much medical treatment you want during end-of-life care.  Where you would like to live during end-of-life care.  What kinds of treatments you would like to keep you comfortable.  Which treatments you would refuse.  Your faith or spiritual needs at the end of your life.  Who will handle practical details, such as your will, finances, and funeral planning. You can create legal documents (advance directives) to let your loved ones know your wishes for end-of-life care. Talk to your health care provider or a lawyer about making a living will that explains your medical wishes. You can also have a medical power of attorney. This designates a person to make health decisions for you if you cannot make them yourself. Summary  End-of-life care is the physical, emotional, mental, and spiritual care a person receives during the last days, weeks, or months of life.  The goal of end-of-life care is to give the patient the highest quality of life possible at the end of life.  There are a number of different options for receiving this care, including palliative care, hospice care, or comfort care.  Talk to your health care provider and your loved ones about your preferences for end-of-life care. This includes the place to receive care, the kind of care you want to  receive, the care you want to decline, your spiritual needs, and your finances. This information is not intended to replace advice given to you by your health care provider. Make sure you discuss any questions you have with your health care provider. Document Released: 12/10/2013 Document Revised: 05/29/2017 Document Reviewed: 05/29/2017 Elsevier Interactive Patient Education  2019 [employer].

## 2018-07-31 NOTE — Discharge Summary (Signed)
Physician Discharge Summary  Ashley Hall KGM:[id number] DOB: 08/04/1945 DOA: 07/23/2018  PCP: Lucia Gaskins, MD  Admit date: 07/23/2018 Discharge date: 07/31/2018  Admitted From: Home  Disposition: Home with [facility]  Discharge Condition: Hospice   CODE STATUS: DNR    Brief Hospitalization Summary: Please see all hospital notes, images, labs for full details of the hospitalization. Brief Narrative:  73 year old female with a history of COPD, chronic pain syndrome from scoliosis, was recently discharged from skilled nursing facility and brought to the emergency room with shortness of breath.  She was found by her home health physical therapist with an oxygen saturation 79% on room air.  She was admitted for acute on respiratory failure with hypoxia secondary to healthcare associated pneumonia.  Chest x-ray did show bilateral infiltrates.  She was started on intravenous antibiotics.  Assessment & Plan:   Principal Problem:   CAP (community acquired pneumonia) Active Problems:   Essential hypertension   Tobacco abuse   Hypothyroidism   GERD (gastroesophageal reflux disease)   Altered mental status   Normocytic anemia   Hyperlipidemia   COPD exacerbation (HCC)   Goals of care, counseling/discussion   Palliative care by specialist   DNR (do not resuscitate) discussion   Hypoxia   Lung mass  1. Acute respiratory failure with hypoxia. RESOLVED. Secondary to pneumonia and presumed metastatic lung cancer.   2. Healthcare associated pneumonia.  TREATED.  Initially started on broad-spectrum antibiotics with cefepime and azithromycin.  3. C diff infection - oral vancomycin started 3/3 for 10 day course.   4. COPD exacerbation.  Pt was treated with intravenous Solu-Medrol.  Continue bronchodilators.  5. Newly found Lung Mass - CT chest with hilar mass  / left bronchus involvement suspicious lymphangitic spread, Pt  declined the CT abdomen/pelvis with contrast, Consulted pulmonology however patient not amenable to having procedures.  I spoke with patient about this and she is not interested in any type of surgery or chemotherapy at this time and she did not seem interested in pursuing further tissue diagnosis.  She declined further CT scanning.  Appreciate palliative consult to discuss goals of care.  Met with niece  with palliative team and decision was made to stop IV fluids, focus on comfort and discharge home with hospice services.   6. Acute encephalopathy related to hypoxia.  Resolved.  She had been waxing and waning with her mentation. This has somewhat improved with oxygen supplementation.  CT scan of her head did not show any acute changes. 7. 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   8. Hypertension.  Continue metoprolol/amlodipine.  Blood pressure stable. 9. Tobacco use.  Counseled on importance of tobacco cessation.  10. GERD.  Continue on PPI.  11. Hyperlipidemia.  Continue statin. 12. Hypothyroidism.  Continue Synthroid.   13. Chronic pain syndrome.  oxycodone 2.5-5 mg oral every 4-6 hours as needed for mod/severe pain.  Symptom management per hospice protocol. 14. Hypokalemia.  Repleted.  15. Hypercalcemia of malignancy -  Treated.  Mostly likely secondary to undiagnosed malignancy.  IV pamidronate and calcitonin given and calcium levels coming down.  IV hydration completed.     DVT prophylaxis: Lovenox Code Status: DNR  Family Communication:  family meeting with palliative care 3/3, I was also present Disposition Plan: Home with [facility]  Consultants:   Palliative care  Pulmonology     Discharge Diagnoses:  Principal Problem:   CAP (community acquired pneumonia)  Active Problems:   Essential hypertension   Tobacco abuse   Hypothyroidism   GERD (gastroesophageal reflux disease)   Altered mental status   Normocytic anemia   Hyperlipidemia   COPD  exacerbation (HCC)   Goals of care, counseling/discussion   Palliative care by specialist   DNR (do not resuscitate) discussion   Hypoxia   Lung mass   Discharge Instructions:  Allergies as of 07/31/2018      Reactions   Pregabalin    REACTION: "couldn't move"      Medication List    STOP taking these medications   Aspirin-Salicylamide-Caffeine [phone] MG Pack   ferrous gluconate 324 MG tablet Commonly known as:  FERGON   fish oil-omega-3 fatty acids 1884 MG capsule   folic acid 1 MG tablet Commonly known as:  FOLVITE   gabapentin 300 MG capsule Commonly known as:  NEURONTIN   methadone 10 MG tablet Commonly known as:  DOLOPHINE   multivitamin capsule   nitroGLYCERIN 0.4 MG SL tablet Commonly known as:  NITROSTAT   pravastatin 40 MG tablet Commonly known as:  PRAVACHOL   SUPER B COMPLEX PO   vitamin A 10000 UNIT capsule   vitamin C 1000 MG tablet   vitamin E 200 UNIT capsule     TAKE these medications   amLODipine 5 MG tablet Commonly known as:  NORVASC Take 5 mg by mouth daily.   busPIRone 5 MG tablet Commonly known as:  BUSPAR Take 1 tablet (5 mg total) by mouth 2 (two) times daily for 30 days.   feeding supplement (ENSURE ENLIVE) Liqd Take 237 mLs by mouth 2 (two) times daily between meals.   levothyroxine 50 MCG tablet Commonly known as:  SYNTHROID, LEVOTHROID Take 50 mcg by mouth every morning.   metoprolol tartrate 25 MG tablet Commonly known as:  LOPRESSOR Take 25 mg by mouth 2 (two) times daily.   oxybutynin 5 MG tablet Commonly known as:  DITROPAN Take 5 mg by mouth daily.   oxyCODONE 5 MG immediate release tablet Commonly known as:  Oxy IR/ROXICODONE Take 0.5-1 tablets (2.5-5 mg total) by mouth every 4 (four) hours as needed for up to 5 days for moderate pain or severe pain.   pantoprazole 40 MG tablet Commonly known as:  PROTONIX TAKE ONE TABLET BY MOUTH TWICE DAILY. TAKE BEFORE A MEAL. (MORNING ,EVENING) What changed:   See the new instructions.   PARoxetine 20 MG tablet Commonly known as:  PAXIL Take 1 tablet (20 mg total) by mouth every morning.   predniSONE 10 MG tablet Commonly known as:  DELTASONE Take 1 tablet (10 mg total) by mouth daily with breakfast for 5 days.   PROAIR HFA 108 (90 Base) MCG/ACT inhaler Generic drug:  albuterol Inhale 2 puffs into the lungs 4 (four) times daily as needed for wheezing or shortness of breath.   topiramate 100 MG tablet Commonly known as:  TOPAMAX Take 1 tablet (100 mg total) by mouth daily.   traZODone 50 MG tablet Commonly known as:  DESYREL Take 0.5 tablets (25 mg total) by mouth at bedtime. What changed:  how much to take   vancomycin 50 mg/mL  oral solution Commonly known as:  VANCOCIN Take 2.5 mLs (125 mg total) by mouth 4 (four) times daily for 8 days.       Allergies  Allergen Reactions  . Pregabalin     REACTION: "couldn't move"   Allergies as of 07/31/2018      Reactions   Pregabalin  REACTION: "couldn't move"      Medication List    STOP taking these medications   Aspirin-Salicylamide-Caffeine [phone] MG Pack   ferrous gluconate 324 MG tablet Commonly known as:  FERGON   fish oil-omega-3 fatty acids 4098 MG capsule   folic acid 1 MG tablet Commonly known as:  FOLVITE   gabapentin 300 MG capsule Commonly known as:  NEURONTIN   methadone 10 MG tablet Commonly known as:  DOLOPHINE   multivitamin capsule   nitroGLYCERIN 0.4 MG SL tablet Commonly known as:  NITROSTAT   pravastatin 40 MG tablet Commonly known as:  PRAVACHOL   SUPER B COMPLEX PO   vitamin A 10000 UNIT capsule   vitamin C 1000 MG tablet   vitamin E 200 UNIT capsule     TAKE these medications   amLODipine 5 MG tablet Commonly known as:  NORVASC Take 5 mg by mouth daily.   busPIRone 5 MG tablet Commonly known as:  BUSPAR Take 1 tablet (5 mg total) by mouth 2 (two) times daily for 30 days.   feeding supplement (ENSURE ENLIVE)  Liqd Take 237 mLs by mouth 2 (two) times daily between meals.   levothyroxine 50 MCG tablet Commonly known as:  SYNTHROID, LEVOTHROID Take 50 mcg by mouth every morning.   metoprolol tartrate 25 MG tablet Commonly known as:  LOPRESSOR Take 25 mg by mouth 2 (two) times daily.   oxybutynin 5 MG tablet Commonly known as:  DITROPAN Take 5 mg by mouth daily.   oxyCODONE 5 MG immediate release tablet Commonly known as:  Oxy IR/ROXICODONE Take 0.5-1 tablets (2.5-5 mg total) by mouth every 4 (four) hours as needed for up to 5 days for moderate pain or severe pain.   pantoprazole 40 MG tablet Commonly known as:  PROTONIX TAKE ONE TABLET BY MOUTH TWICE DAILY. TAKE BEFORE A MEAL. (MORNING ,EVENING) What changed:  See the new instructions.   PARoxetine 20 MG tablet Commonly known as:  PAXIL Take 1 tablet (20 mg total) by mouth every morning.   predniSONE 10 MG tablet Commonly known as:  DELTASONE Take 1 tablet (10 mg total) by mouth daily with breakfast for 5 days.   PROAIR HFA 108 (90 Base) MCG/ACT inhaler Generic drug:  albuterol Inhale 2 puffs into the lungs 4 (four) times daily as needed for wheezing or shortness of breath.   topiramate 100 MG tablet Commonly known as:  TOPAMAX Take 1 tablet (100 mg total) by mouth daily.   traZODone 50 MG tablet Commonly known as:  DESYREL Take 0.5 tablets (25 mg total) by mouth at bedtime. What changed:  how much to take   vancomycin 50 mg/mL  oral solution Commonly known as:  VANCOCIN Take 2.5 mLs (125 mg total) by mouth 4 (four) times daily for 8 days.       Procedures/Studies: Dg Chest 2 View  Result Date: 07/23/2018 CLINICAL DATA:  Shortness of breath.  Hypoxia. EXAM: CHEST - 2 VIEW COMPARISON:  Single-view of the chest 06/19/2018, 12/20/2017 and 05/11/2017. FINDINGS: Airspace disease in the left chest seen on the most recent examination has worsened and now involves the lower lobe. There is also new airspace disease in the right  lung base. Heart size is normal. Aortic atherosclerosis. IMPRESSION: Airspace disease in the left lung seen on the most recent examination has worsened and there is new airspace disease in the right lung base consistent with progressive pneumonia. Atherosclerosis. Electronically Signed   By: Inge Rise M.D.  On: 07/23/2018 15:18   Ct Chest W Contrast  Result Date: 07/27/2018 CLINICAL DATA:  Pneumonia.  Hx of HTN,COPD/bbj EXAM: CT CHEST WITH CONTRAST TECHNIQUE: Multidetector CT imaging of the chest was performed during intravenous contrast administration. CONTRAST:  19m OMNIPAQUE IOHEXOL 300 MG/ML  SOLN COMPARISON:  07/23/2018 chest x-ray and 03/07/2015 chest CT FINDINGS: Cardiovascular: There is dense atherosclerotic calcification of the coronary arteries. No pericardial effusion. Heart size is normal. There is minimal calcification of the thoracic aorta not associated with aneurysm. The appearance of the pulmonary arteries is unremarkable accounting for the contrast bolus timing. Mediastinum/Nodes: Thyroid is unremarkable. Numerous enlarged mediastinal lymph nodes. Pretracheal lymph node is 1.5 centimeters. Precarinal lymph node is 1.5 centimeters. Confluent subcarinal mass/adenopathy is 1.9 x 6.5 centimeters, including the esophagus which is encased. Large prevascular lymph nodes, largest measuring 3.1 centimeters. RIGHT infrahilar mass 2.2 centimeters. RIGHT hilar lymph nodes are 1.3 and 1.9 centimeters. Confluent opacity in the LEFT hilum precludes measurement of discrete nodes. Lungs/Pleura: Bilateral pleural effusions, LEFT greater than RIGHT. There is extrinsic effacement of the LEFT mainstem bronchus secondary to mediastinal adenopathy. There is dense opacity within the LEFT hilar region measuring 5.1 x 8.3 centimeters on coronal images. There is consolidation in the LOWER lobes bilaterally, LEFT greater than RIGHT. Focal discrete areas of airspace filling identified in the RIGHT UPPER and LOWER  lobes. Extensive irregular thickening of the septal lines seen throughout the LEFT lung, more confluent opacity near the hilar regions and bases. Discrete nodule in the LEFT LOWER lobe is 6 millimeters on image 77 of series 4. Upper Abdomen: Partially imaged cyst within the UPPER pole of the LEFT kidney is 1.5 centimeters in diameter. The imaged portions of the adrenal glands are unremarkable. Question of colonic wall thickening in the region of the splenic flexure, incompletely evaluated on this study of the chest. There is atherosclerotic calcification of the abdominal aorta. Musculoskeletal: Irregularity and sclerosis involving the manubrium of the sternum. Findings are consistent with remote fracture, new since the prior CT exam in 2016. There are numerous old rib fractures. No definitive lytic or blastic lesions. IMPRESSION: 1. Confluent masslike density in the LEFT hilar region, associated with extrinsic narrowing of the LEFT mainstem bronchus measuring up to 8.3 centimeters. There is associated significant mediastinal and hilar adenopathy. Significant septal wall thickening throughout the LEFT lung and associated pleural effusion is suspicious for lymphangitic spread of tumor. 2. Focal areas of airspace filling in the RIGHT lung may be infection or tumor. 3. Remote sternal fracture. 4. Numerous old rib fractures. 5. Question of colonic wall thickening in the region of the splenic flexure. This is incompletely evaluated but raises a question of colonic lesion or colitis. Consider further evaluation with CT of the abdomen and pelvis. Intravenous and Oral contrast are recommended unless contraindicated. 6. Coronary artery disease. 7. Partially imaged LEFT renal cyst. 8.  Aortic atherosclerosis.  (ICD10-I70.0) coronary artery disease. These results will be called to the ordering clinician or representative by the Radiologist Assistant, and communication documented in the PACS or zVision Dashboard. Electronically  Signed   By: ENolon NationsM.D.   On: 07/27/2018 20:00      Subjective: Pt says she is going home today.  Discharge Exam: Vitals:   07/31/18 0418 07/31/18 0824  BP: (!) 146/63   Pulse: 76   Resp: 20   Temp: 98.3 F (36.8 C)   SpO2: 91% 92%   Vitals:   07/30/18 1940 07/30/18 2337 07/31/18 0418 07/31/18 01610  BP:  (!) 142/68 (!) 146/63   Pulse: 80 78 76   Resp: 18 20 20    Temp:  98.3 F (36.8 C) 98.3 F (36.8 C)   TempSrc:  Oral Axillary   SpO2: 90% 90% 91% 92%  Weight:      Height:       General exam: emaciated, chronically ill appearing female.  NAD.  Respiratory system: Bilateral rhonchi unchanged. Respiratory effort normal. Cardiovascular system:normal s1,s2 sounds. No murmurs, rubs, gallops. Gastrointestinal system: Abdomen is nondistended, soft and nontender. No organomegaly or masses felt. Normal bowel sounds heard. Central nervous system: Alert and oriented. No focal neurological deficits. Extremities: No C/C/E, +pedal pulses Skin: No rashes, lesions or ulcers Psychiatry: flat affect.     The results of significant diagnostics from this hospitalization (including imaging, microbiology, ancillary and laboratory) are listed below for reference.     Microbiology: Recent Results (from the past 240 hour(s))  C difficile quick scan w PCR reflex     Status: Abnormal   Collection Time: 07/30/18 10:04 AM  Result Value Ref Range Status   C Diff antigen POSITIVE (A) NEGATIVE Final   C Diff toxin POSITIVE (A) NEGATIVE Final    Comment: CRITICAL RESULT CALLED TO, READ BACK BY AND VERIFIED WITH: FOLEY,B@1102  BY MATTHEWS, B 3.3.2020    C Diff interpretation Toxin producing C. difficile detected.  Final    Comment: CRITICAL RESULT CALLED TO, READ BACK BY AND VERIFIED WITH: FOLEY,B@1102  BY MATTHEWS, B 3.3.2020 Performed at [facility], [address], [place], [place] [address]      Labs: BNP (last 3 results) No results for input(s): BNP in the last 8760  hours. Basic Metabolic Panel: Recent Labs  Lab 07/25/18 0708 07/26/18 0631 07/27/18 0630 07/28/18 0736 07/29/18 0628 07/30/18 0603  NA 141  141 141 143 139 138 137  K 3.2*  3.2* 3.4* 3.0* 3.8 3.4* 3.5  CL 112*  111 113* 113* 110 111 112*  CO2 22  23 22 24 22  20* 19*  GLUCOSE 81  81 91 94 94 128* 117*  BUN 16  16 8  7* 6* 6* 8  CREATININE 0.88  0.86 0.71 0.61 0.71 0.61 0.64  CALCIUM 11.8*  11.9* 11.1* 11.3* 12.0* 10.8* 10.1  MG  --  1.6*  --  1.6*  --   --   PHOS 1.7* 1.8*  --  2.6  --  2.4*   Liver Function Tests: Recent Labs  Lab 07/26/18 0631 07/27/18 0630 07/28/18 0736 07/29/18 0628 07/30/18 0603  AST  --  38 42* 36  --   ALT  --  13 14 13   --   ALKPHOS  --  87 120 96  --   BILITOT  --  0.3 0.4 0.3  --   PROT  --  5.6* 6.9 5.7*  --   ALBUMIN 2.3* 2.2* 2.8* 2.3* 2.3*   No results for input(s): LIPASE, AMYLASE in the last 168 hours. No results for input(s): AMMONIA in the last 168 hours. CBC: Recent Labs  Lab 07/25/18 0708 07/26/18 0631 07/29/18 0628  WBC 6.9 5.3 8.1  NEUTROABS 5.1 3.5  --   HGB 10.6* 9.4* 8.9*  HCT 36.2 31.8* 29.9*  MCV 101.4* 98.8 96.1  PLT 196 163 186   Cardiac Enzymes: No results for input(s): CKTOTAL, CKMB, CKMBINDEX, TROPONINI in the last 168 hours. BNP: Invalid input(s): POCBNP CBG: No results for input(s): GLUCAP in the last 168 hours. D-Dimer No results for input(s): DDIMER in  the last 72 hours. Hgb A1c No results for input(s): HGBA1C in the last 72 hours. Lipid Profile No results for input(s): CHOL, HDL, LDLCALC, TRIG, CHOLHDL, LDLDIRECT in the last 72 hours. Thyroid function studies No results for input(s): TSH, T4TOTAL, T3FREE, THYROIDAB in the last 72 hours.  Invalid input(s): FREET3 Anemia work up No results for input(s): VITAMINB12, FOLATE, FERRITIN, TIBC, IRON, RETICCTPCT in the last 72 hours. Urinalysis    Component Value Date/Time   COLORURINE STRAW (A) 04/01/2017 0142   APPEARANCEUR CLEAR 04/01/2017  0142   LABSPEC 1.005 04/01/2017 0142   PHURINE 6.0 04/01/2017 0142   GLUCOSEU NEGATIVE 04/01/2017 0142   HGBUR NEGATIVE 04/01/2017 0142   BILIRUBINUR NEGATIVE 04/01/2017 0142   KETONESUR NEGATIVE 04/01/2017 0142   PROTEINUR NEGATIVE 04/01/2017 0142   UROBILINOGEN 0.2 05/06/2014 1225   NITRITE NEGATIVE 04/01/2017 0142   LEUKOCYTESUR NEGATIVE 04/01/2017 0142   Sepsis Labs Invalid input(s): PROCALCITONIN,  WBC,  LACTICIDVEN Microbiology Recent Results (from the past 240 hour(s))  C difficile quick scan w PCR reflex     Status: Abnormal   Collection Time: 07/30/18 10:04 AM  Result Value Ref Range Status   C Diff antigen POSITIVE (A) NEGATIVE Final   C Diff toxin POSITIVE (A) NEGATIVE Final    Comment: CRITICAL RESULT CALLED TO, READ BACK BY AND VERIFIED WITH: FOLEY,B@1102  BY MATTHEWS, B 3.3.2020    C Diff interpretation Toxin producing C. difficile detected.  Final    Comment: CRITICAL RESULT CALLED TO, READ BACK BY AND VERIFIED WITH: FOLEY,B@1102  BY MATTHEWS, B 3.3.2020 Performed at [facility], [address], [place], [place] [address]     Time coordinating discharge: 38 mins  SIGNED:  Irwin Brakeman, MD  [facility] 07/31/2018, 11:58 AM How to contact the [facility] Attending or Consulting provider Lane or covering provider during after hours [employer], for this patient?  1. Check the care team in [facility] and look for a) attending/consulting [facility] provider listed and b) the [facility] team listed 2. Log into [email or web address] and use 's universal password to access. If you do not have the password, please contact the hospital operator. 3. Locate the [facility] provider you are looking for under [facility] and page to a number that you can be directly reached. 4. If you still have difficulty reaching the provider, please page the [facility] (Director on Call) for the Hospitalists listed on amion for assistance.

## 2018-09-27 DEATH — deceased

## 2019-03-01 IMAGING — DX DG LUMBAR SPINE 2-3V
3 series · 3 of 3 positions shown · non-contrast
Comparison: CT scan December 11, 2017

CLINICAL DATA: Low back pain.  Fall yesterday.

EXAM:
LUMBAR SPINE - 2-3 VIEW

[l-spine ap]
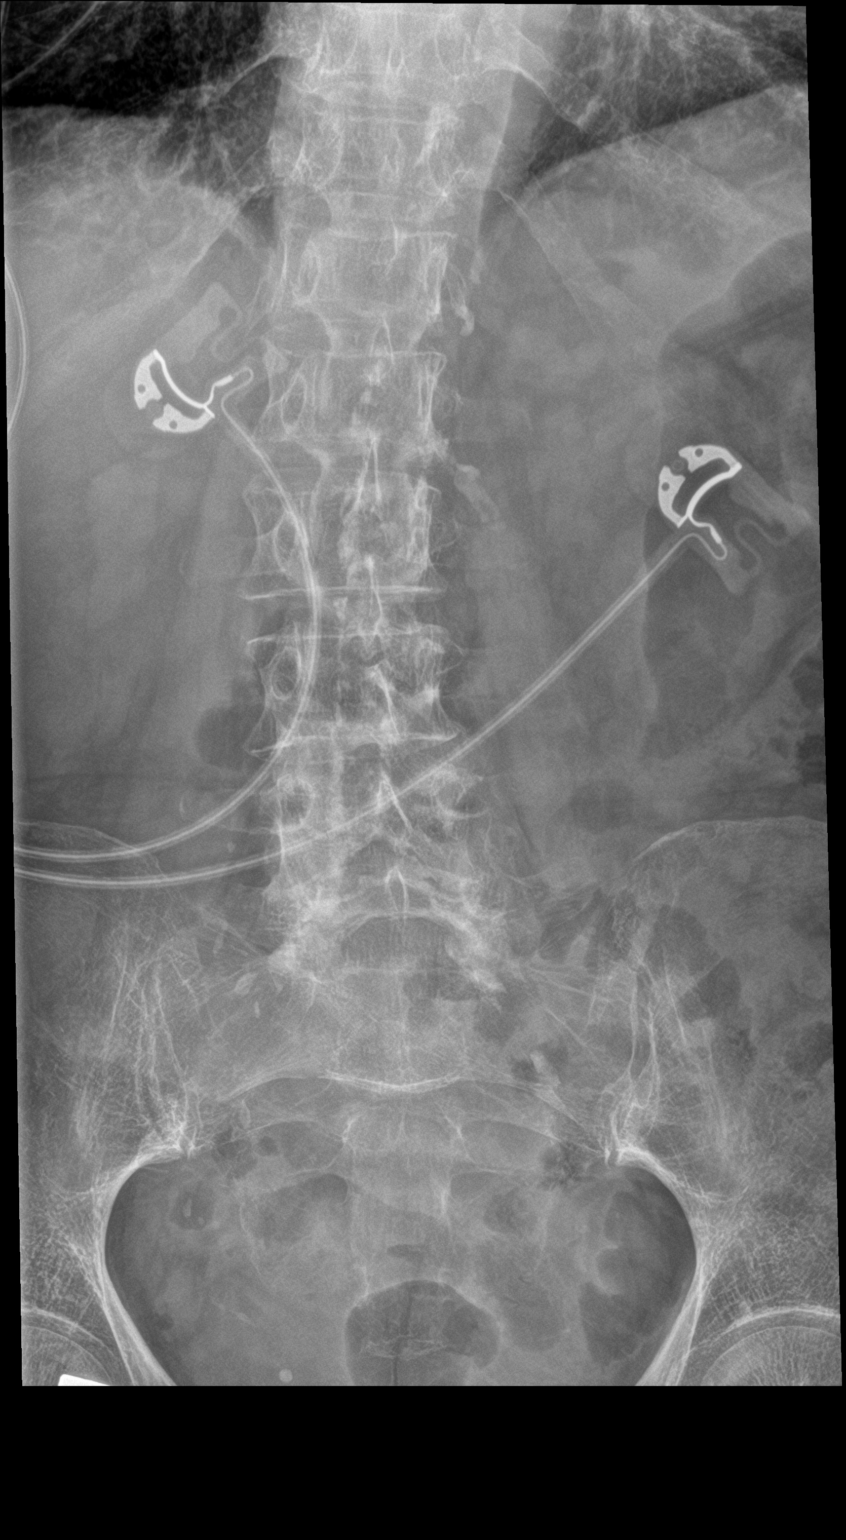

[l-spine lat]
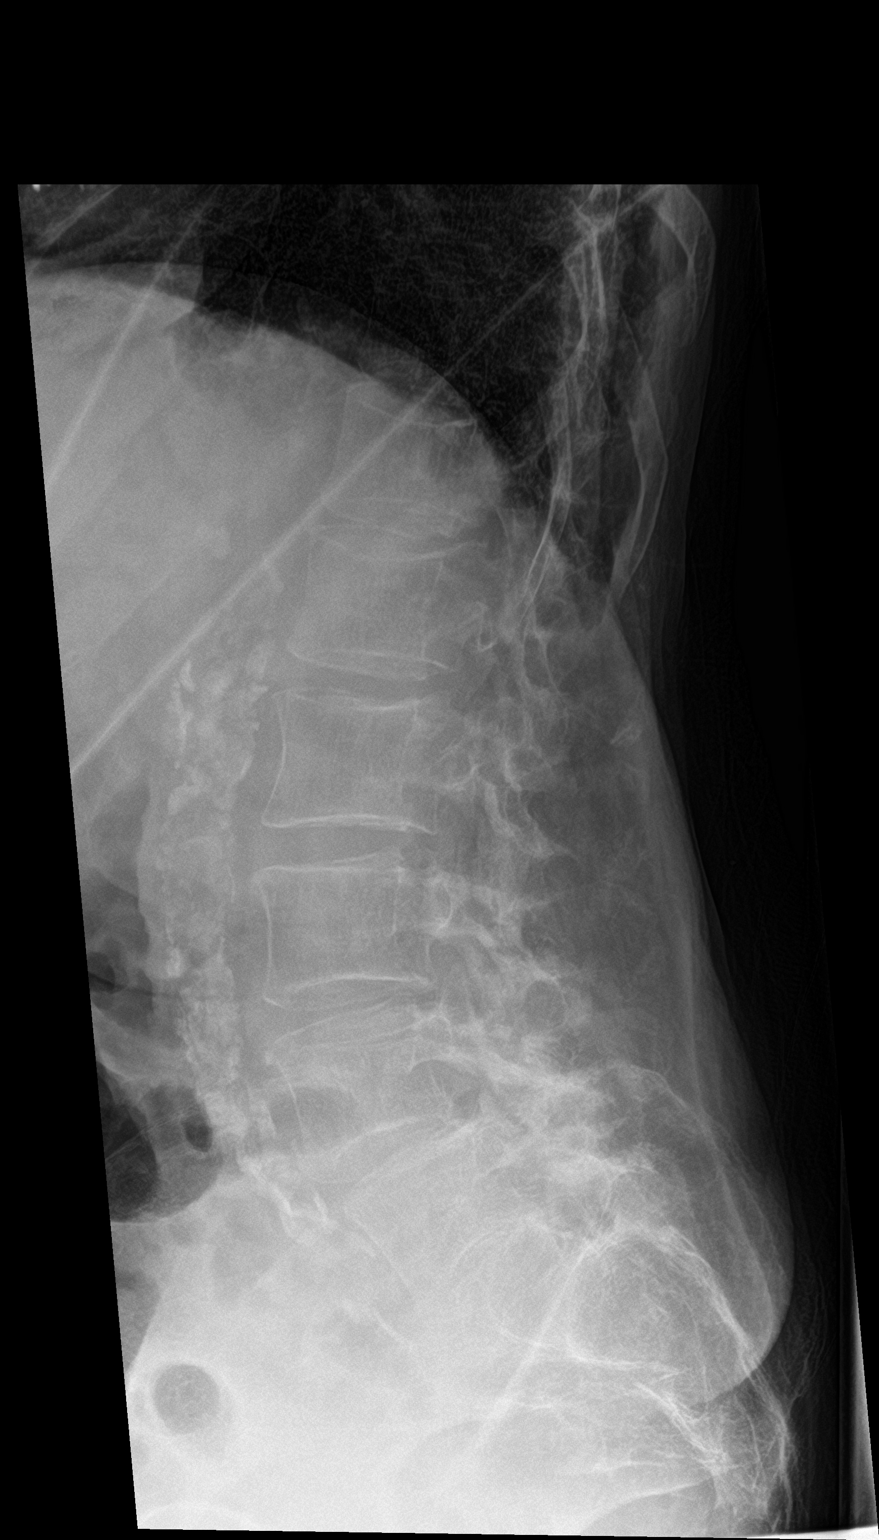

[l-spine spot]
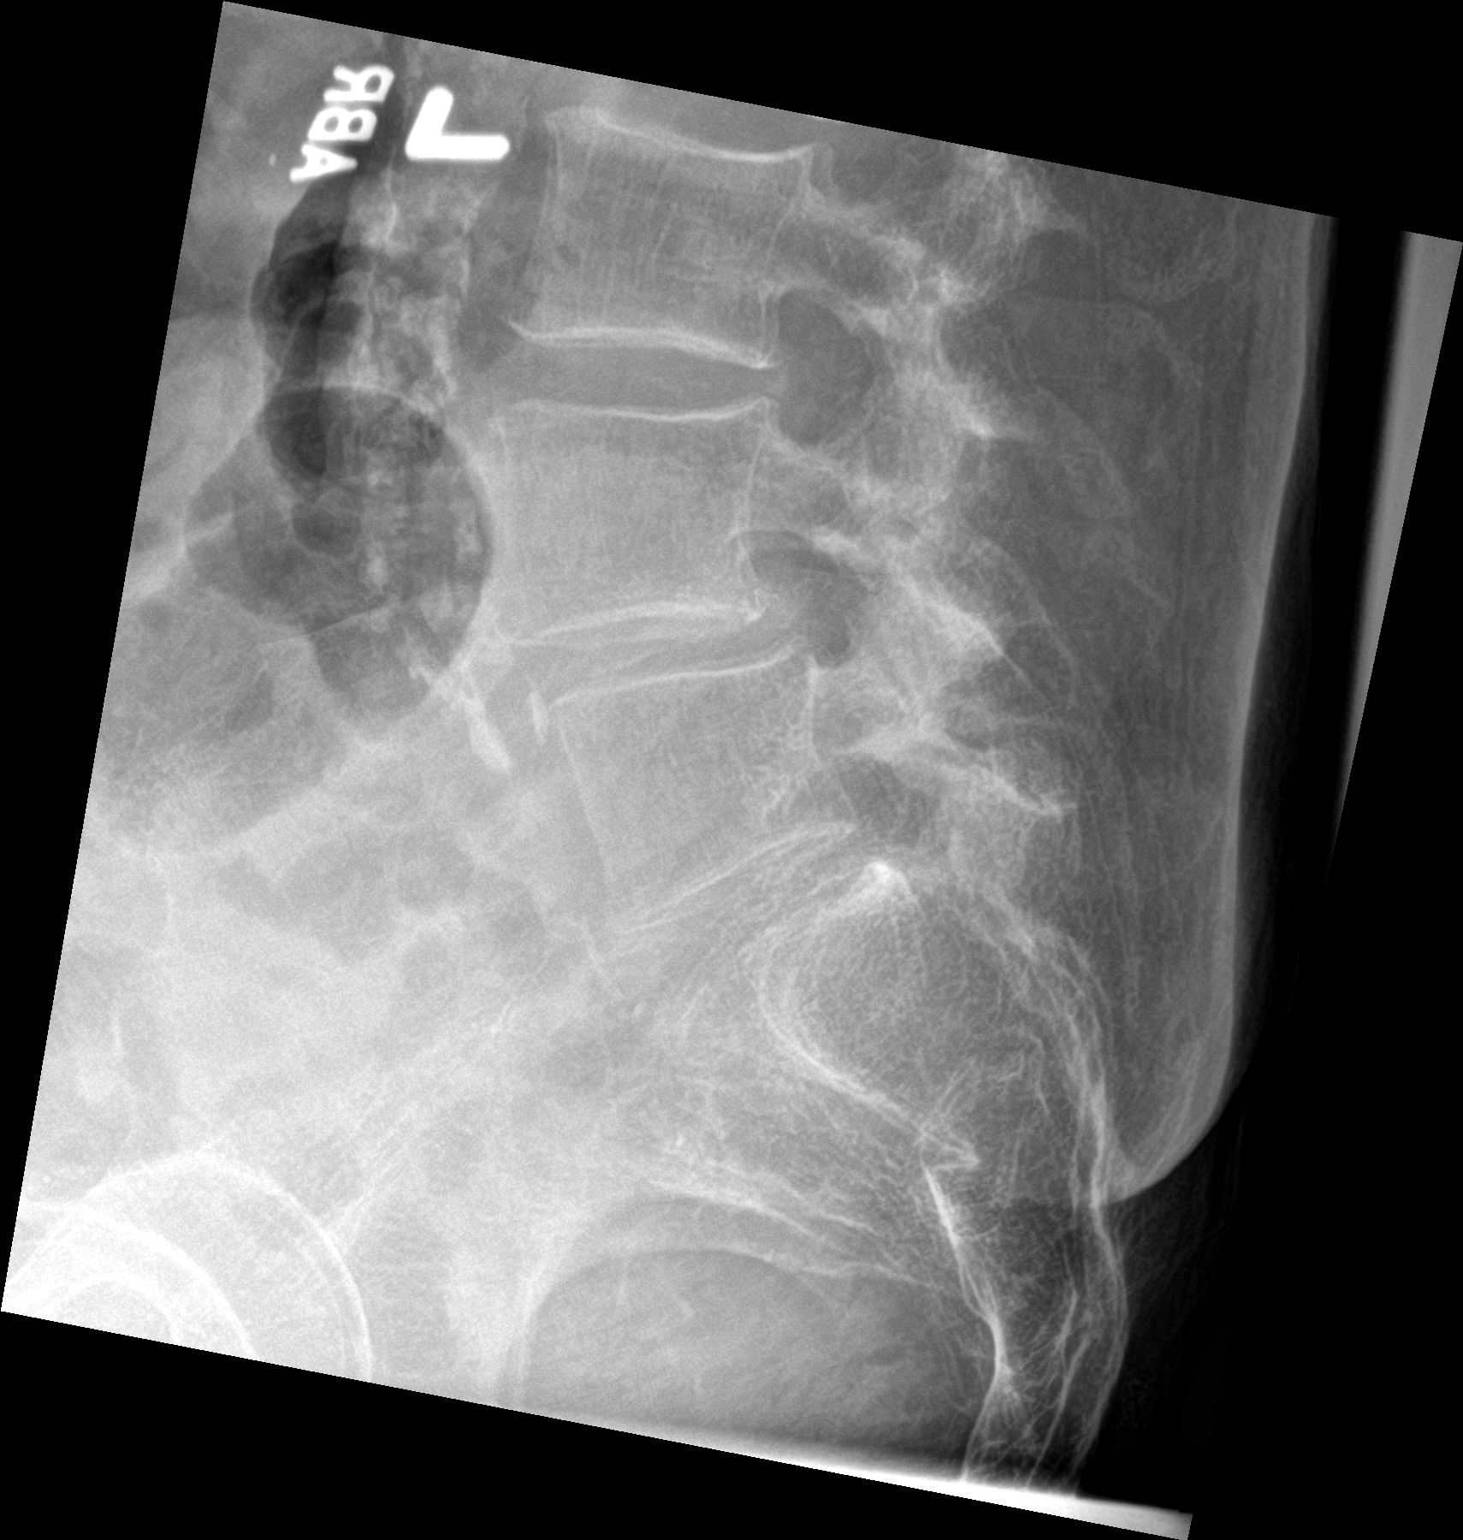

[3 of 3 positions shown; findings below may reference images not displayed]

FINDINGS: No fracture or traumatic malalignment. Minimal degenerative disc
disease. Lower lumbar facet degenerative changes are noted.
Calcified atherosclerosis of the abdominal aorta.
IMPRESSION: Degenerative changes.  No acute fracture or malalignment noted.
